# Patient Record
Sex: Female | Born: 1960 | Race: White | Hispanic: No | Marital: Single | Smoking: Current every day smoker
Health system: Southern US, Community
[De-identification: ages and names within clinical notes are randomized; demographics above are authoritative.]

## PROBLEM LIST (undated history)

## (undated) DIAGNOSIS — F329 Major depressive disorder, single episode, unspecified: Secondary | ICD-10-CM

## (undated) DIAGNOSIS — J449 Chronic obstructive pulmonary disease, unspecified: Secondary | ICD-10-CM

## (undated) DIAGNOSIS — I10 Essential (primary) hypertension: Secondary | ICD-10-CM

## (undated) DIAGNOSIS — I639 Cerebral infarction, unspecified: Secondary | ICD-10-CM

## (undated) DIAGNOSIS — M1990 Unspecified osteoarthritis, unspecified site: Secondary | ICD-10-CM

## (undated) DIAGNOSIS — G4730 Sleep apnea, unspecified: Secondary | ICD-10-CM

## (undated) DIAGNOSIS — F32A Depression, unspecified: Secondary | ICD-10-CM

## (undated) DIAGNOSIS — Z720 Tobacco use: Secondary | ICD-10-CM

## (undated) DIAGNOSIS — R519 Headache, unspecified: Secondary | ICD-10-CM

## (undated) DIAGNOSIS — R0600 Dyspnea, unspecified: Secondary | ICD-10-CM

## (undated) DIAGNOSIS — Z87442 Personal history of urinary calculi: Secondary | ICD-10-CM

## (undated) DIAGNOSIS — J45909 Unspecified asthma, uncomplicated: Secondary | ICD-10-CM

## (undated) HISTORY — DX: Depression, unspecified: F32.A

## (undated) HISTORY — DX: Sleep apnea, unspecified: G47.30

## (undated) HISTORY — PX: TONSILLECTOMY: SUR1361

## (undated) HISTORY — PX: DILATION AND CURETTAGE OF UTERUS: SHX78

## (undated) HISTORY — PX: APPENDECTOMY: SHX54

## (undated) HISTORY — PX: CHOLECYSTECTOMY: SHX55

---
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
## 2003-02-21 ENCOUNTER — Encounter: Payer: Self-pay | Admitting: Family Medicine

## 2003-02-21 ENCOUNTER — Ambulatory Visit (HOSPITAL_COMMUNITY): Admission: RE | Admit: 2003-02-21 | Discharge: 2003-02-21 | Payer: Self-pay | Admitting: *Deleted

## 2006-01-03 ENCOUNTER — Ambulatory Visit: Payer: Self-pay | Admitting: Nurse Practitioner

## 2006-06-05 ENCOUNTER — Emergency Department (HOSPITAL_COMMUNITY): Admission: EM | Admit: 2006-06-05 | Discharge: 2006-06-05 | Payer: Self-pay | Admitting: Emergency Medicine

## 2006-08-11 ENCOUNTER — Emergency Department: Payer: Self-pay | Admitting: Unknown Physician Specialty

## 2006-09-19 ENCOUNTER — Emergency Department: Payer: Self-pay | Admitting: Unknown Physician Specialty

## 2008-02-16 ENCOUNTER — Emergency Department (HOSPITAL_COMMUNITY): Admission: EM | Admit: 2008-02-16 | Discharge: 2008-02-16 | Payer: Self-pay | Admitting: Emergency Medicine

## 2008-06-12 ENCOUNTER — Emergency Department: Payer: Self-pay | Admitting: Emergency Medicine

## 2010-11-08 ENCOUNTER — Encounter: Payer: Self-pay | Admitting: Family Medicine

## 2011-06-07 ENCOUNTER — Emergency Department: Payer: Self-pay | Admitting: Internal Medicine

## 2012-06-29 ENCOUNTER — Emergency Department: Payer: Self-pay | Admitting: Emergency Medicine

## 2014-04-08 ENCOUNTER — Emergency Department: Payer: Self-pay | Admitting: Emergency Medicine

## 2014-04-13 ENCOUNTER — Emergency Department: Payer: Self-pay | Admitting: Emergency Medicine

## 2014-06-10 ENCOUNTER — Ambulatory Visit: Payer: Self-pay

## 2015-03-25 ENCOUNTER — Encounter: Payer: Self-pay | Admitting: Emergency Medicine

## 2015-03-25 ENCOUNTER — Emergency Department: Payer: Self-pay

## 2015-03-25 ENCOUNTER — Inpatient Hospital Stay
Admission: EM | Admit: 2015-03-25 | Discharge: 2015-03-26 | DRG: 189 | Disposition: A | Discharge status: Home / Self Care | Payer: Self-pay | Attending: Internal Medicine | Admitting: Internal Medicine

## 2015-03-25 DIAGNOSIS — Z8249 Family history of ischemic heart disease and other diseases of the circulatory system: Secondary | ICD-10-CM

## 2015-03-25 DIAGNOSIS — J441 Chronic obstructive pulmonary disease with (acute) exacerbation: Secondary | ICD-10-CM | POA: Diagnosis present

## 2015-03-25 DIAGNOSIS — Z823 Family history of stroke: Secondary | ICD-10-CM

## 2015-03-25 DIAGNOSIS — J9600 Acute respiratory failure, unspecified whether with hypoxia or hypercapnia: Principal | ICD-10-CM | POA: Diagnosis present

## 2015-03-25 DIAGNOSIS — I10 Essential (primary) hypertension: Secondary | ICD-10-CM | POA: Diagnosis present

## 2015-03-25 DIAGNOSIS — F17210 Nicotine dependence, cigarettes, uncomplicated: Secondary | ICD-10-CM | POA: Diagnosis present

## 2015-03-25 DIAGNOSIS — Z720 Tobacco use: Secondary | ICD-10-CM | POA: Diagnosis present

## 2015-03-25 DIAGNOSIS — Z716 Tobacco abuse counseling: Secondary | ICD-10-CM | POA: Diagnosis present

## 2015-03-25 HISTORY — DX: Essential (primary) hypertension: I10

## 2015-03-25 HISTORY — DX: Tobacco use: Z72.0

## 2015-03-25 HISTORY — DX: Chronic obstructive pulmonary disease, unspecified: J44.9

## 2015-03-25 LAB — CBC WITH DIFFERENTIAL/PLATELET
Basophils Absolute: 0.2 10*3/uL — ABNORMAL HIGH (ref 0–0.1)
Basophils Relative: 2 %
EOS ABS: 0.3 10*3/uL (ref 0–0.7)
Eosinophils Relative: 3 %
HCT: 45.7 % (ref 35.0–47.0)
HEMOGLOBIN: 15 g/dL (ref 12.0–16.0)
LYMPHS ABS: 2.7 10*3/uL (ref 1.0–3.6)
Lymphocytes Relative: 28 %
MCH: 29.4 pg (ref 26.0–34.0)
MCHC: 32.9 g/dL (ref 32.0–36.0)
MCV: 89.3 fL (ref 80.0–100.0)
MONO ABS: 0.7 10*3/uL (ref 0.2–0.9)
MONOS PCT: 7 %
NEUTROS PCT: 60 %
Neutro Abs: 5.8 10*3/uL (ref 1.4–6.5)
Platelets: 202 10*3/uL (ref 150–440)
RBC: 5.11 MIL/uL (ref 3.80–5.20)
RDW: 13.4 % (ref 11.5–14.5)
WBC: 9.7 10*3/uL (ref 3.6–11.0)

## 2015-03-25 LAB — BASIC METABOLIC PANEL
Anion gap: 7 (ref 5–15)
BUN: 22 mg/dL — ABNORMAL HIGH (ref 6–20)
CHLORIDE: 107 mmol/L (ref 101–111)
CO2: 29 mmol/L (ref 22–32)
Calcium: 8.9 mg/dL (ref 8.9–10.3)
Creatinine, Ser: 1.01 mg/dL — ABNORMAL HIGH (ref 0.44–1.00)
GFR calc Af Amer: >60 mL/min (ref >60)
Glucose, Bld: 92 mg/dL (ref 65–99)
Potassium: 4.1 mmol/L (ref 3.5–5.1)
Sodium: 143 mmol/L (ref 135–145)

## 2015-03-25 LAB — TROPONIN I: Troponin I: <0.03 ng/mL (ref <0.031)

## 2015-03-25 LAB — BRAIN NATRIURETIC PEPTIDE: B Natriuretic Peptide: 17 pg/mL (ref 0.0–100.0)

## 2015-03-25 MED ORDER — POLYETHYLENE GLYCOL 3350 17 G PO PACK
17.0000 g | PACK | Freq: Every day | ORAL | Status: DC | PRN
  Filled 2015-03-25: qty 1

## 2015-03-25 MED ORDER — SODIUM CHLORIDE 0.9 % IJ SOLN
3.0000 mL | Freq: Two times a day (BID) | INTRAMUSCULAR | Status: DC
  Administered 2015-03-25 – 2015-03-26 (×2): 3 mL via INTRAVENOUS

## 2015-03-25 MED ORDER — IPRATROPIUM-ALBUTEROL 0.5-2.5 (3) MG/3ML IN SOLN
RESPIRATORY_TRACT | Status: AC
  Administered 2015-03-25: 9 mL via RESPIRATORY_TRACT
  Filled 2015-03-25: qty 27

## 2015-03-25 MED ORDER — ACETAMINOPHEN 650 MG RE SUPP: 650.0000 mg | Freq: Four times a day (QID) | RECTAL | Status: DC | PRN

## 2015-03-25 MED ORDER — METHYLPREDNISOLONE SODIUM SUCC 125 MG IJ SOLR
60.0000 mg | Freq: Two times a day (BID) | INTRAMUSCULAR | Status: DC
  Administered 2015-03-25: 125 mg via INTRAVENOUS
  Administered 2015-03-26: 60 mg via INTRAVENOUS
  Filled 2015-03-25 (×2): qty 2

## 2015-03-25 MED ORDER — ALBUTEROL SULFATE (2.5 MG/3ML) 0.083% IN NEBU
2.5000 mg | INHALATION_SOLUTION | Freq: Once | RESPIRATORY_TRACT | Status: AC
  Administered 2015-03-25: 2.5 mg via RESPIRATORY_TRACT

## 2015-03-25 MED ORDER — METHYLPREDNISOLONE SODIUM SUCC 125 MG IJ SOLR
INTRAMUSCULAR | Status: AC
  Administered 2015-03-25: 125 mg via INTRAVENOUS
  Filled 2015-03-25: qty 2

## 2015-03-25 MED ORDER — SODIUM CHLORIDE 0.9 % IV SOLN
250.0000 mL | INTRAVENOUS | Status: DC | PRN
  Administered 2015-03-25: 250 mL via INTRAVENOUS

## 2015-03-25 MED ORDER — ONDANSETRON HCL 4 MG/2ML IJ SOLN: 4.0000 mg | Freq: Four times a day (QID) | INTRAMUSCULAR | Status: DC | PRN

## 2015-03-25 MED ORDER — ONDANSETRON HCL 4 MG PO TABS
4.0000 mg | ORAL_TABLET | Freq: Four times a day (QID) | ORAL | Status: DC | PRN
Start: 2015-03-25 — End: 2015-03-26

## 2015-03-25 MED ORDER — GUAIFENESIN-DM 100-10 MG/5ML PO SYRP: 5.0000 mL | ORAL_SOLUTION | ORAL | Status: DC | PRN

## 2015-03-25 MED ORDER — ASPIRIN 81 MG PO CHEW
CHEWABLE_TABLET | ORAL | Status: AC
  Administered 2015-03-25: 324 mg via ORAL
  Filled 2015-03-25: qty 4

## 2015-03-25 MED ORDER — LORAZEPAM 2 MG/ML IJ SOLN
0.5000 mg | Freq: Once | INTRAMUSCULAR | Status: AC
  Administered 2015-03-25: 0.5 mg via INTRAVENOUS

## 2015-03-25 MED ORDER — NITROGLYCERIN IN D5W 200-5 MCG/ML-% IV SOLN: 0.0000 ug/min | Freq: Once | INTRAVENOUS | Status: DC

## 2015-03-25 MED ORDER — IPRATROPIUM-ALBUTEROL 0.5-2.5 (3) MG/3ML IN SOLN
9.0000 mL | Freq: Once | RESPIRATORY_TRACT | Status: AC
  Administered 2015-03-25: 9 mL via RESPIRATORY_TRACT

## 2015-03-25 MED ORDER — SODIUM CHLORIDE 0.9 % IJ SOLN: 3.0000 mL | Freq: Two times a day (BID) | INTRAMUSCULAR | Status: DC

## 2015-03-25 MED ORDER — ASPIRIN 81 MG PO CHEW
324.0000 mg | CHEWABLE_TABLET | Freq: Once | ORAL | Status: AC
  Administered 2015-03-25: 324 mg via ORAL

## 2015-03-25 MED ORDER — ALUM & MAG HYDROXIDE-SIMETH 200-200-20 MG/5ML PO SUSP: 30.0000 mL | Freq: Four times a day (QID) | ORAL | Status: DC | PRN

## 2015-03-25 MED ORDER — ALBUTEROL SULFATE (2.5 MG/3ML) 0.083% IN NEBU
INHALATION_SOLUTION | RESPIRATORY_TRACT | Status: AC
  Administered 2015-03-25: 2.5 mg via RESPIRATORY_TRACT
  Filled 2015-03-25: qty 3

## 2015-03-25 MED ORDER — ENOXAPARIN SODIUM 40 MG/0.4ML ~~LOC~~ SOLN
40.0000 mg | SUBCUTANEOUS | Status: DC
Start: 2015-03-25 — End: 2015-03-26
  Filled 2015-03-25 (×2): qty 0.4

## 2015-03-25 MED ORDER — SODIUM CHLORIDE 0.9 % IJ SOLN: 3.0000 mL | INTRAMUSCULAR | Status: DC | PRN

## 2015-03-25 MED ORDER — LEVOFLOXACIN IN D5W 750 MG/150ML IV SOLN
750.0000 mg | INTRAVENOUS | Status: DC
  Administered 2015-03-25: 750 mg via INTRAVENOUS

## 2015-03-25 MED ORDER — NICOTINE 14 MG/24HR TD PT24
14.0000 mg | MEDICATED_PATCH | Freq: Every day | TRANSDERMAL | Status: DC
  Administered 2015-03-25 – 2015-03-26 (×2): 14 mg via TRANSDERMAL
  Filled 2015-03-25 (×2): qty 1

## 2015-03-25 MED ORDER — LORAZEPAM 2 MG/ML IJ SOLN
INTRAMUSCULAR | Status: AC
  Administered 2015-03-25: 0.5 mg via INTRAVENOUS
  Filled 2015-03-25: qty 1

## 2015-03-25 MED ORDER — MAGNESIUM SULFATE 2 GM/50ML IV SOLN
2.0000 g | Freq: Once | INTRAVENOUS | Status: AC
  Administered 2015-03-25: 2 g via INTRAVENOUS

## 2015-03-25 MED ORDER — ALBUTEROL SULFATE (2.5 MG/3ML) 0.083% IN NEBU
2.5000 mg | INHALATION_SOLUTION | RESPIRATORY_TRACT | Status: DC | PRN
  Administered 2015-03-25: 2.5 mg via RESPIRATORY_TRACT

## 2015-03-25 MED ORDER — ACETAMINOPHEN 325 MG PO TABS
650.0000 mg | ORAL_TABLET | Freq: Four times a day (QID) | ORAL | Status: DC | PRN
  Administered 2015-03-25 – 2015-03-26 (×2): 650 mg via ORAL
  Filled 2015-03-25 (×2): qty 2

## 2015-03-25 MED ORDER — DEXTROSE 5 % IV SOLN: 500.0000 mg | Freq: Once | INTRAVENOUS | Status: DC

## 2015-03-25 MED ORDER — IPRATROPIUM-ALBUTEROL 0.5-2.5 (3) MG/3ML IN SOLN
3.0000 mL | RESPIRATORY_TRACT | Status: DC
  Administered 2015-03-25 (×3): 3 mL via RESPIRATORY_TRACT
  Filled 2015-03-25 (×3): qty 3

## 2015-03-25 MED ORDER — DIPHENHYDRAMINE HCL 50 MG/ML IJ SOLN
INTRAMUSCULAR | Status: AC
  Administered 2015-03-25: 50 mg
  Filled 2015-03-25: qty 1

## 2015-03-25 MED ORDER — MOMETASONE FURO-FORMOTEROL FUM 100-5 MCG/ACT IN AERO
2.0000 | INHALATION_SPRAY | Freq: Two times a day (BID) | RESPIRATORY_TRACT | Status: DC
  Administered 2015-03-25 – 2015-03-26 (×2): 2 via RESPIRATORY_TRACT
  Filled 2015-03-25 (×2): qty 8.8

## 2015-03-25 MED ORDER — METHYLPREDNISOLONE SODIUM SUCC 125 MG IJ SOLR
125.0000 mg | Freq: Once | INTRAMUSCULAR | Status: AC
  Administered 2015-03-25: 125 mg via INTRAVENOUS

## 2015-03-25 NOTE — ED Notes (Signed)
Pt resting in bed, bipap intact, pt's breathing appears non labored at this time. Sats 100%, bp stable 140/98. Pt denies any pain at this time.

## 2015-03-25 NOTE — ED Notes (Signed)
Pt assisted to bathroom, bipap removed, pt tolerated well, sats remained mid 90's, pt in no resp distress. Dr Vivia EwingSudani notified, order to transfer her over to [place]@ 2L.

## 2015-03-25 NOTE — ED Notes (Signed)
Difficulty breathing ,x2 months , sudden onset of worsening x7 hours ,difficulty speaking in complete sentences

## 2015-03-25 NOTE — ED Notes (Signed)
Pt resting, bipap intact, pt tolerating well, denies pain.

## 2015-03-25 NOTE — ED Provider Notes (Signed)
[facility] Emergency Department Provider Note  ____________________________________________  Time seen: Upon arrival to the emergency department  I have reviewed the triage vital signs and the nursing notes.   HISTORY  Chief Complaint Respiratory Distress    HPI Traci Cross is a 54 y.o. female with a history of COPD who presents with worsening shortness of breath over the past 7 hours. The patient has been having increased weakness over the past several months with worsening shortness of breath. Worsens with exertion She denies a cough or fever. She denies any chest pain, nausea or vomiting. He has been trying to cut down on her smoking and is down now to 1 pack per day.   Past Medical History  Diagnosis Date  . COPD (chronic obstructive pulmonary disease)     There are no active problems to display for this patient.   Past Surgical History  Procedure Laterality Date  . Tonsillectomy      No current outpatient prescriptions on file.  Allergies Review of patient's allergies indicates no known allergies.  No family history on file.  Social History History  Substance Use Topics  . Smoking status: Current Every Day Smoker  . Smokeless tobacco: Not on file  . Alcohol Use: Not on file    Review of Systems Constitutional: No fever/chills Eyes: No visual changes. ENT: No sore throat. Cardiovascular: Denies chest pain. Respiratory: As above Gastrointestinal: No abdominal pain.  No nausea, no vomiting.  No diarrhea.  No constipation. Genitourinary: Negative for dysuria. Musculoskeletal: Negative for back pain. Skin: Negative for rash. Neurological: Negative for headaches, focal weakness or numbness.  10-point ROS otherwise negative.  ____________________________________________   PHYSICAL EXAM:  VITAL SIGNS: ED Triage Vitals  Enc Vitals Group     BP 03/25/15 0706 180/145 mmHg     Pulse Rate 03/25/15 0706 82     Resp 03/25/15 0707  22     Temp 03/25/15 0706 98.7 F (37.1 C)     Temp Source 03/25/15 0706 Oral     SpO2 03/25/15 0707 96 %     Weight 03/25/15 0707 190 lb (86.183 kg)     Height 03/25/15 0707 5' 7.5" (1.715 m)     Head Cir --      Peak Flow --      Pain Score 03/25/15 0707 0     Pain Loc --      Pain Edu? --      Excl. in GC? --     Constitutional: Alert and oriented.  Eyes: Conjunctivae are normal. PERRL. EOMI. Head: Atraumatic. Nose: No congestion/rhinnorhea. Mouth/Throat: Mucous membranes are moist.  Oropharynx non-erythematous. Neck: No stridor.   Cardiovascular: Normal rate, regular rhythm. Grossly normal heart sounds.  Good peripheral circulation. Respiratory: Increased respiratory effort. No retractions. Decreased breath sounds throughout. No overt wheezing or rales.  Gastrointestinal: Soft and nontender. No distention. No abdominal bruits. No CVA tenderness. Musculoskeletal: No lower extremity tenderness nor edema.  No joint effusions. Neurologic:  Normal speech and language. No gross focal neurologic deficits are appreciated. Speech is normal. No gait instability. Skin:  Skin is warm, dry and intact. No rash noted. Psychiatric: Mood and affect are normal. Speech and behavior are normal.  ____________________________________________   LABS (all labs ordered are listed, but only abnormal results are displayed)  Labs Reviewed  CBC WITH DIFFERENTIAL/PLATELET - Abnormal; Notable for the following:    Basophils Absolute 0.2 (*)    All other components within normal limits  BASIC  METABOLIC PANEL - Abnormal; Notable for the following:    BUN 22 (*)    Creatinine, Ser 1.01 (*)    All other components within normal limits  TROPONIN I  BRAIN NATRIURETIC PEPTIDE   ____________________________________________  EKG  ED ECG REPORT I, Arelia LongestSchaevitz,  Wirt Hemmerich M, the attending physician, personally viewed and interpreted this ECG.   Date: 03/25/2015  EKG Time: 721  Rate: 83  Rhythm: normal  sinus rhythm  Axis: Normal axis  Intervals:none  ST&T Change: No ST elevations or depressions. No abnormal T-wave inversions. Machine read as junctional ST depression but likely due to patient's EKG baseline  ____________________________________________  RADIOLOGY  No acute cardiopulmonary abnormality. ____________________________________________   PROCEDURES  CRITICAL CARE Performed by: Arelia LongestSchaevitz,  Adewale Pucillo M   Total critical care time: 40 minutes  Critical care time was exclusive of separately billable procedures and treating other patients.  Critical care was necessary to treat or prevent imminent or life-threatening deterioration.  Critical care was time spent personally by me on the following activities: development of treatment plan with patient and/or surrogate as well as nursing, discussions with consultants, evaluation of patient's response to treatment, examination of patient, obtaining history from patient or surrogate, ordering and performing treatments and interventions, ordering and review of laboratory studies, ordering and review of radiographic studies, pulse oximetry and re-evaluation of patient's condition.  Patient monitored on BiPAP. ____________________________________________   INITIAL IMPRESSION / ASSESSMENT AND PLAN / ED COURSE  Pertinent labs & imaging results that were available during my care of the patient were reviewed by me and considered in my medical decision making (see chart for details).  BiPAP started because of patient's increased work of breathing.  ----------------------------------------- 8:50 AM on 03/25/2015 -----------------------------------------  After nebs and steroids the patient now has increased air movement and I can now hear wheezes. COPD is consistent with the patient's history. We'll continue on BiPAP. Tolerating BiPAP very well and now speaking in full sentences. To admit patient. Signed out to Dr. Elpidio AnisSudini. Discussed with  patient the importance of stopping smoking as this should help with her lung disease. ____________________________________________   FINAL CLINICAL IMPRESSION(S) / ED DIAGNOSES   Acute COPD exacerbation. Initial visit.   [name] Blazeravid Matthew Merton Wadlow, MD 03/25/15 [phone]

## 2015-03-25 NOTE — ED Notes (Signed)
Pt states that she feels a "stinging" sensation on her right wrist area. Site appears inflammed, redness noted, pt states it itches as well. Site marked for borders. Dr notified, fluid hung, benadryl given per order. Pt tolerated well. No resp distress noted. No rash noted on body.

## 2015-03-25 NOTE — H&P (Signed)
[facility] [place] at [facility]   PATIENT NAME: Traci Cross    MR#:  [id number]  DATE OF BIRTH:  10-Feb-1961  DATE OF ADMISSION:  03/25/2015  PRIMARY CARE PHYSICIAN: No primary care provider on file.   REQUESTING/REFERRING PHYSICIAN: Dr. Raynelle CharySCHAVITZ - ED  CHIEF COMPLAINT:   Chief Complaint  Patient presents with  . Respiratory Distress    HISTORY OF PRESENT ILLNESS:  Traci Cross  is a 54 y.o. female with a known history of COPD, tobacco abuse presents to the emergency room with worsening shortness of breath over the past few weeks. Patient has had on and off wheezing, fatigue but continued to smoke. Her shortness of breath worsened acutely over the last 12 hours and presented to the emergency room. Here she's been noticed to have significant shortness of breath with IV starts and multiple nebulizer therapy only minimal improvement and is being admitted to the hospital. Patient needs BiPAP support and is critically ill.  History has been obtained from old records, patient, ER staff and family at bedside. Chest x-ray reviewed independently.  She had significantly elevated blood pressure on arrival to emergency room but has improved close to normal without any intervention.  PAST MEDICAL HISTORY:   Past Medical History  Diagnosis Date  . COPD (chronic obstructive pulmonary disease)   . Tobacco abuse     PAST SURGICAL HISTORY:   Past Surgical History  Procedure Laterality Date  . Tonsillectomy      SOCIAL HISTORY:   History  Substance Use Topics  . Smoking status: Current Every Day Smoker -- 35 years    Types: Cigarettes  . Smokeless tobacco: Not on file  . Alcohol Use: No    FAMILY HISTORY:   Family History  Problem Relation Age of Onset  . Heart failure Mother   . Heart failure Father   . Stroke Mother   . Stroke Father     DRUG ALLERGIES:  No Known Allergies  REVIEW OF SYSTEMS:   Review of Systems  Constitutional: Positive for  malaise/fatigue. Negative for fever, chills and weight loss.  HENT: Negative for hearing loss and nosebleeds.   Eyes: Negative for blurred vision, double vision and pain.  Respiratory: Positive for cough, sputum production, shortness of breath and wheezing. Negative for hemoptysis.   Cardiovascular: Negative for chest pain, palpitations, orthopnea and leg swelling.  Gastrointestinal: Positive for nausea. Negative for vomiting, abdominal pain, diarrhea and constipation.  Genitourinary: Negative for dysuria and hematuria.  Musculoskeletal: Positive for myalgias. Negative for back pain and falls.  Skin: Negative for rash.  Neurological: Positive for weakness. Negative for dizziness, tremors, sensory change, speech change, focal weakness, seizures and headaches.  Endo/Heme/Allergies: Does not bruise/bleed easily.  Psychiatric/Behavioral: Negative for depression and memory loss. The patient is not nervous/anxious.     MEDICATIONS AT HOME:   Prior to Admission medications   Not on File      VITAL SIGNS:  Blood pressure 140/98, pulse 80, temperature 98.7 F (37.1 C), temperature source Oral, resp. rate 20, height 5\' 7"  (1.702 m), weight 86.183 kg (190 lb), SpO2 100 %.  PHYSICAL EXAMINATION:  Physical Exam  GENERAL:  54 y.o.-year-old patient lying in the bed with acute resp distress. Critically ill appearing EYES: Pupils equal, round, reactive to light and accommodation. No scleral icterus. Extraocular muscles intact.  HEENT: Head atraumatic, normocephalic. Oropharynx and nasopharynx clear. No oropharyngeal erythema, moist oral mucosa  NECK:  Supple, no jugular venous distention. No thyroid enlargement,  no tenderness.  LUNGS: Using accessory muscles, bilateral weezing CARDIOVASCULAR: S1, S2 normal. No murmurs, rubs, or gallops.  ABDOMEN: Soft, nontender, nondistended. Bowel sounds present. No organomegaly or mass.  EXTREMITIES: No pedal edema, cyanosis, or clubbing. + 2 pedal & radial  pulses b/l.   NEUROLOGIC: Cranial nerves II through XII are intact. No focal Motor or sensory deficits appreciated b/l PSYCHIATRIC: The patient is alert and oriented x 3. Good affect.  SKIN: No obvious rash, lesion, or ulcer.   LABORATORY PANEL:   CBC  Recent Labs Lab 03/25/15 0722  WBC 9.7  HGB 15.0  HCT 45.7  PLT 202   ------------------------------------------------------------------------------------------------------------------  Chemistries   Recent Labs Lab 03/25/15 0722  NA 143  K 4.1  CL 107  CO2 29  GLUCOSE 92  BUN 22*  CREATININE 1.01*  CALCIUM 8.9   ------------------------------------------------------------------------------------------------------------------  Cardiac Enzymes  Recent Labs Lab 03/25/15 0722  TROPONINI <0.03   ------------------------------------------------------------------------------------------------------------------  RADIOLOGY:  Dg Chest 1 View  03/25/2015   CLINICAL DATA:  54 year old female with COPD and acute shortness of breath this morning. On BiPAP. Initial encounter.  EXAM: CHEST  1 VIEW  COMPARISON:  Cervical spine radiographs 04/08/2014. Chest radiographs 06/29/2012.  FINDINGS: Portable AP upright view at 0739 hours. 2013 comparison demonstrates chronic increased AP dimension to the chest. Cardiac and mediastinal contours remain within normal limits. No pneumothorax or pulmonary edema. No pleural effusion or consolidation. No acute or confluent pulmonary opacity identified.  IMPRESSION: No acute cardiopulmonary abnormality.   Electronically Signed   By: Odessa Fleming M.D.   On: 03/25/2015 08:02     IMPRESSION AND PLAN:   * Acute COPD exacerbation -IV steroids, Antibiotics - Scheduled Nebulizers - Inhalers -Wean O2 as tolerated - Consult pulmonary if no improvement  Needs pulmonary f/u at discharge along with PFT, Advair, Spiriva.  * Acute resp failure due to above Critically ill. Bipap. Intubate if any  worsening.  * Elevated blood pressure without diagnosis of hypertension Likely from acute distress. Has improved well. We will monitor. Start medications if consistently elevated.  * Tobacco abuse Counseled to quit   All the records are reviewed and case discussed with ED provider. Management plans discussed with the patient, family and they are in agreement.  CODE STATUS: FULL CODE  TOTAL CRITICAL CARE TIME TAKING CARE OF THIS PATIENT: 40 minutes.    Milagros Loll R M.D on 03/25/2015 at 9:20 AM  Between 7am to 6pm - Pager - [phone]  After 6pm go to [email or web address] - password [email or web address] [facility]  [place] [facility]  Office  [phone]  CC: Primary care physician; No primary care provider on file.

## 2015-03-26 ENCOUNTER — Encounter: Payer: Self-pay | Admitting: Internal Medicine

## 2015-03-26 DIAGNOSIS — I10 Essential (primary) hypertension: Secondary | ICD-10-CM | POA: Diagnosis present

## 2015-03-26 MED ORDER — TIOTROPIUM BROMIDE MONOHYDRATE 2.5 MCG/ACT IN AERS: 2.0000 | INHALATION_SPRAY | Freq: Every day | RESPIRATORY_TRACT | Status: DC

## 2015-03-26 MED ORDER — NICOTINE 14 MG/24HR TD PT24
14.0000 mg | MEDICATED_PATCH | Freq: Every day | TRANSDERMAL | Status: DC
Start: 2015-03-26 — End: 2016-11-21

## 2015-03-26 MED ORDER — ALBUTEROL SULFATE HFA 108 (90 BASE) MCG/ACT IN AERS: 2.0000 | INHALATION_SPRAY | Freq: Four times a day (QID) | RESPIRATORY_TRACT | Status: DC | PRN

## 2015-03-26 MED ORDER — HYDROCHLOROTHIAZIDE 25 MG PO TABS: 25.0000 mg | ORAL_TABLET | Freq: Every day | ORAL | Status: DC

## 2015-03-26 MED ORDER — PREDNISONE 20 MG PO TABS: 40.0000 mg | ORAL_TABLET | Freq: Every day | ORAL | Status: DC

## 2015-03-26 MED ORDER — BUDESONIDE-FORMOTEROL FUMARATE 80-4.5 MCG/ACT IN AERO: 2.0000 | INHALATION_SPRAY | Freq: Two times a day (BID) | RESPIRATORY_TRACT | Status: DC

## 2015-03-26 MED ORDER — AZITHROMYCIN 250 MG PO TABS: 500.0000 mg | ORAL_TABLET | Freq: Every day | ORAL | Status: DC

## 2015-03-26 NOTE — Progress Notes (Deleted)
Pt resting quietly in bed at this time, non productive cough noted during this shift cough med given with good effect.Iv fluids infusing without difficulty.

## 2015-03-26 NOTE — Discharge Summary (Signed)
[name]   PATIENT NAME: Traci Cross    MR#:  [id number]  DATE OF BIRTH:  03-04-1961  DATE OF ADMISSION:  03/25/2015 ADMITTING PHYSICIAN: Milagros Loll, MD  DATE OF DISCHARGE: No discharge date for patient encounter.  PRIMARY CARE PHYSICIAN: No primary care provider on file.    ADMISSION DIAGNOSIS:  COPD exacerbation [J44.1]  DISCHARGE DIAGNOSIS:  Principal Problem:   Acute respiratory failure Active Problems:   Tobacco abuse   COPD exacerbation   Accelerated hypertension   SECONDARY DIAGNOSIS:   Past Medical History  Diagnosis Date  . COPD (chronic obstructive pulmonary disease)   . Tobacco abuse   . Hypertension      ADMITTING HISTORY  Traci Cross is a 54 y.o. female with a known history of COPD, tobacco abuse presents to the emergency room with worsening shortness of breath over the past few weeks. Patient has had on and off wheezing, fatigue but continued to smoke. Her shortness of breath worsened acutely over the last 12 hours and presented to the emergency room. Here she's been noticed to have significant shortness of breath with IV starts and multiple nebulizer therapy only minimal improvement and is being admitted to the hospital. Patient needs BiPAP support and is critically ill.  History has been obtained from old records, patient, ER staff and family at bedside. Chest x-ray reviewed independently.  She had significantly elevated blood pressure on arrival to emergency room but has improved close to normal without any intervention.   HOSPITAL COURSE:   * Acute COPD exacerbation -IV steroids, Antibiotics - Scheduled Nebulizers - Inhalers -Wean O2 as tolerated Off Bipap.  * Acute resp failure due to above Resolved  * HTN Started on HCTZ  * Tobacco abuse Counseled to quit  And improved well during his stay in the hospital. By the day of discharge she is on room air. No shortness of breath. Has been  given prescriptions for COPD medications. Referral to PCP. Patient will need outpatient pulmonary function test.  CONSULTS OBTAINED:     DRUG ALLERGIES:   Allergies  Allergen Reactions  . Levaquin [Levofloxacin] Hives    Dr notified.     DISCHARGE MEDICATIONS:   Current Discharge Medication List    START taking these medications   Details  albuterol (PROVENTIL HFA;VENTOLIN HFA) 108 (90 BASE) MCG/ACT inhaler Inhale 2 puffs into the lungs every 6 (six) hours as needed for wheezing or shortness of breath. Qty: 1 Inhaler, Refills: 0    azithromycin (ZITHROMAX) 250 MG tablet Take 2 tablets (500 mg total) by mouth daily. Qty: 4 tablet, Refills: 0    budesonide-formoterol (SYMBICORT) 80-4.5 MCG/ACT inhaler Inhale 2 puffs into the lungs 2 (two) times daily. Qty: 1 Inhaler, Refills: 12    hydrochlorothiazide (HYDRODIURIL) 25 MG tablet Take 1 tablet (25 mg total) by mouth daily. Qty: 30 tablet, Refills: 0    nicotine (NICODERM CQ - DOSED IN MG/24 HOURS) 14 mg/24hr patch Place 1 patch (14 mg total) onto the skin daily. Qty: 28 patch, Refills: 0    predniSONE (DELTASONE) 20 MG tablet Take 2 tablets (40 mg total) by mouth daily with breakfast. Qty: 8 tablet, Refills: 0    Tiotropium Bromide Monohydrate (SPIRIVA RESPIMAT) 2.5 MCG/ACT AERS Inhale 2 puffs into the lungs daily. Qty: 1 Inhaler, Refills: 0       Today    VITAL SIGNS:  Blood pressure 150/91, pulse 88, temperature 98 F (36.7 C), temperature source Oral, resp.  rate 18, height  (1.702 m), weight 88.089 kg (194 lb 3.2 oz), SpO2 97 %.  I/O:   Intake/Output Summary (Last 24 hours) at 03/26/15 1312 Last data filed at 03/26/15 0830  Gross per 24 hour  Intake    480 ml  Output    800 ml  Net   -320 ml    PHYSICAL EXAMINATION:  Physical Exam  GENERAL:  54 y.o.-year-old patient lying in the bed with no acute distress.  LUNGS: Normal breath sounds bilaterally, no wheezing, rales,rhonchi or crepitation. No use of  accessory muscles of respiration.  CARDIOVASCULAR: S1, S2 normal. No murmurs, rubs, or gallops.  ABDOMEN: Soft, non-tender, non-distended. Bowel sounds present. No organomegaly or mass.  NEUROLOGIC: Moves all 4 extremities. PSYCHIATRIC: The patient is alert and oriented x 3.  SKIN: No obvious rash, lesion, or ulcer.   DATA REVIEW:   CBC  Recent Labs Lab 03/25/15 0722  WBC 9.7  HGB 15.0  HCT 45.7  PLT 202    Chemistries   Recent Labs Lab 03/25/15 0722  NA 143  K 4.1  CL 107  CO2 29  GLUCOSE 92  BUN 22*  CREATININE 1.01*  CALCIUM 8.9    Cardiac Enzymes  Recent Labs Lab 03/25/15 0722  TROPONINI <0.03    Microbiology Results  No results found for this or any previous visit.  RADIOLOGY:  Dg Chest 1 View  03/25/2015   CLINICAL DATA:  54 year old female with COPD and acute shortness of breath this morning. On BiPAP. Initial encounter.  EXAM: CHEST  1 VIEW  COMPARISON:  Cervical spine radiographs 04/08/2014. Chest radiographs 06/29/2012.  FINDINGS: Portable AP upright view at 0739 hours. 2013 comparison demonstrates chronic increased AP dimension to the chest. Cardiac and mediastinal contours remain within normal limits. No pneumothorax or pulmonary edema. No pleural effusion or consolidation. No acute or confluent pulmonary opacity identified.  IMPRESSION: No acute cardiopulmonary abnormality.   Electronically Signed   By: Odessa Fleming M.D.   On: 03/25/2015 08:02      Follow up with PCP in 1 week.  Management plans discussed with the patient, family and they are in agreement.  CODE STATUS:     Code Status Orders        Start     Ordered   03/25/15 0853  Full code   Continuous     03/25/15 0853      TOTAL TIME TAKING CARE OF THIS PATIENT ON DAY OF DISCHARGE: more than 30  minutes.    Milagros Loll R M.D on 03/26/2015 at 1:12 PM  Between 7am to 6pm - Pager - [phone]  After 6pm go to [email or web address] - password [email or web address] [facility]  [place] [facility]   Office  [phone]  CC: Primary care physician; No primary care provider on file.

## 2015-03-26 NOTE — Progress Notes (Signed)
Patient is discharge home in a stable condition, denies pain or sob at time of discharge, summary and f/u care given, verbalized understanding , left with a family friend

## 2015-03-26 NOTE — Discharge Instructions (Signed)
°  DIET:  °Cardiac diet ° °DISCHARGE CONDITION:  °Stable ° °ACTIVITY:  °Activity as tolerated ° °OXYGEN:  °Home Oxygen: No. °  °Oxygen Delivery: room air ° °DISCHARGE LOCATION:  °home  ° °If you experience worsening of your admission symptoms, develop shortness of breath, life threatening emergency, suicidal or homicidal thoughts you must seek medical attention immediately by calling 911 or calling your MD immediately  if symptoms less severe. ° °You Must read complete instructions/literature along with all the possible adverse reactions/side effects for all the Medicines you take and that have been prescribed to you. Take any new Medicines after you have completely understood and accpet all the possible adverse reactions/side effects.  ° °Please note ° °You were cared for by a hospitalist during your hospital stay. If you have any questions about your discharge medications or the care you received while you were in the hospital after you are discharged, you can call the unit and asked to speak with the hospitalist on call if the hospitalist that took care of you is not available. Once you are discharged, your primary care physician will handle any further medical issues. Please note that NO REFILLS for any discharge medications will be authorized once you are discharged, as it is imperative that you return to your primary care physician (or establish a relationship with a primary care physician if you do not have one) for your aftercare needs so that they can reassess your need for medications and monitor your lab values. ° °QUIT SMOKING °

## 2015-03-26 NOTE — Progress Notes (Signed)
Pt resting quietly in bed at this time no visible sign of distress noted, c/o headache , tylenol given with satisfactory effect.

## 2015-03-26 NOTE — Care Management (Signed)
Patient without payor or PCP.  Provided her with applications for [facility] and [facility].   Faxed scripts to [facility] and left messages to call CM with confirmation but did not receive return call.  Did receive confirmation of fax.  Provided applications to both agencies

## 2015-03-30 ENCOUNTER — Ambulatory Visit: Payer: Medicaid Other

## 2015-04-04 ENCOUNTER — Other Ambulatory Visit: Payer: Self-pay

## 2015-04-04 ENCOUNTER — Encounter: Payer: Self-pay | Admitting: Emergency Medicine

## 2015-04-04 ENCOUNTER — Emergency Department: Payer: Medicaid Other

## 2015-04-04 ENCOUNTER — Emergency Department
Admission: EM | Admit: 2015-04-04 | Discharge: 2015-04-04 | Disposition: A | Discharge status: Home / Self Care | Payer: Medicaid Other | Attending: Emergency Medicine | Admitting: Emergency Medicine

## 2015-04-04 DIAGNOSIS — Z7951 Long term (current) use of inhaled steroids: Secondary | ICD-10-CM | POA: Insufficient documentation

## 2015-04-04 DIAGNOSIS — Z720 Tobacco use: Secondary | ICD-10-CM | POA: Insufficient documentation

## 2015-04-04 DIAGNOSIS — R079 Chest pain, unspecified: Secondary | ICD-10-CM | POA: Insufficient documentation

## 2015-04-04 DIAGNOSIS — I10 Essential (primary) hypertension: Secondary | ICD-10-CM | POA: Insufficient documentation

## 2015-04-04 DIAGNOSIS — Z79899 Other long term (current) drug therapy: Secondary | ICD-10-CM | POA: Insufficient documentation

## 2015-04-04 DIAGNOSIS — R51 Headache: Secondary | ICD-10-CM | POA: Insufficient documentation

## 2015-04-04 DIAGNOSIS — J441 Chronic obstructive pulmonary disease with (acute) exacerbation: Secondary | ICD-10-CM

## 2015-04-04 LAB — COMPREHENSIVE METABOLIC PANEL
ALBUMIN: 3.8 g/dL (ref 3.5–5.0)
ALT: 16 U/L (ref 14–54)
AST: 21 U/L (ref 15–41)
Alkaline Phosphatase: 82 U/L (ref 38–126)
Anion gap: 5 (ref 5–15)
BUN: 14 mg/dL (ref 6–20)
CALCIUM: 8.5 mg/dL — AB (ref 8.9–10.3)
CO2: 27 mmol/L (ref 22–32)
Chloride: 104 mmol/L (ref 101–111)
Creatinine, Ser: 0.81 mg/dL (ref 0.44–1.00)
GFR calc Af Amer: >60 mL/min (ref >60)
GFR calc non Af Amer: >60 mL/min (ref >60)
Glucose, Bld: 126 mg/dL — ABNORMAL HIGH (ref 65–99)
Potassium: 3.8 mmol/L (ref 3.5–5.1)
Sodium: 136 mmol/L (ref 135–145)
TOTAL PROTEIN: 7.2 g/dL (ref 6.5–8.1)
Total Bilirubin: 0.5 mg/dL (ref 0.3–1.2)

## 2015-04-04 LAB — TROPONIN I

## 2015-04-04 LAB — CBC
HCT: 47.9 % — ABNORMAL HIGH (ref 35.0–47.0)
HEMOGLOBIN: 16.2 g/dL — AB (ref 12.0–16.0)
MCH: 29.9 pg (ref 26.0–34.0)
MCHC: 33.9 g/dL (ref 32.0–36.0)
MCV: 88.3 fL (ref 80.0–100.0)
Platelets: 191 10*3/uL (ref 150–440)
RBC: 5.43 MIL/uL — AB (ref 3.80–5.20)
RDW: 13.7 % (ref 11.5–14.5)
WBC: 8.5 10*3/uL (ref 3.6–11.0)

## 2015-04-04 MED ORDER — DIPHENHYDRAMINE HCL 25 MG PO CAPS
ORAL_CAPSULE | ORAL | Status: AC
  Administered 2015-04-04: 25 mg via ORAL
  Filled 2015-04-04: qty 1

## 2015-04-04 MED ORDER — PREDNISONE 20 MG PO TABS
40.0000 mg | ORAL_TABLET | Freq: Every day | ORAL | Status: DC
Start: 2015-04-04 — End: 2016-03-21

## 2015-04-04 MED ORDER — METHYLPREDNISOLONE SODIUM SUCC 125 MG IJ SOLR
125.0000 mg | Freq: Once | INTRAMUSCULAR | Status: AC
  Administered 2015-04-04: 125 mg via INTRAVENOUS

## 2015-04-04 MED ORDER — IPRATROPIUM-ALBUTEROL 0.5-2.5 (3) MG/3ML IN SOLN
3.0000 mL | RESPIRATORY_TRACT | Status: AC
  Administered 2015-04-04 (×3): 3 mL via RESPIRATORY_TRACT

## 2015-04-04 MED ORDER — KETOROLAC TROMETHAMINE 30 MG/ML IJ SOLN
INTRAMUSCULAR | Status: AC
  Administered 2015-04-04: 30 mg via INTRAVENOUS
  Filled 2015-04-04: qty 1

## 2015-04-04 MED ORDER — KETOROLAC TROMETHAMINE 30 MG/ML IJ SOLN
30.0000 mg | Freq: Once | INTRAMUSCULAR | Status: AC
  Administered 2015-04-04: 30 mg via INTRAVENOUS

## 2015-04-04 MED ORDER — METOCLOPRAMIDE HCL 10 MG PO TABS
ORAL_TABLET | ORAL | Status: AC
Start: 2015-04-04 — End: 2015-04-04
  Administered 2015-04-04: 10 mg via ORAL
  Filled 2015-04-04: qty 1

## 2015-04-04 MED ORDER — METHYLPREDNISOLONE SODIUM SUCC 125 MG IJ SOLR
INTRAMUSCULAR | Status: AC
  Administered 2015-04-04: 125 mg via INTRAVENOUS
  Filled 2015-04-04: qty 2

## 2015-04-04 MED ORDER — DIPHENHYDRAMINE HCL 25 MG PO CAPS: 50.0000 mg | ORAL_CAPSULE | Freq: Four times a day (QID) | ORAL | Status: DC | PRN

## 2015-04-04 MED ORDER — ACETAMINOPHEN 500 MG PO TABS
ORAL_TABLET | ORAL | Status: AC
  Administered 2015-04-04: 1000 mg via ORAL
  Filled 2015-04-04: qty 2

## 2015-04-04 MED ORDER — DEXAMETHASONE 4 MG PO TABS
10.0000 mg | ORAL_TABLET | Freq: Once | ORAL | Status: AC
  Administered 2015-04-04: 10 mg via ORAL
  Filled 2015-04-04: qty 2.5

## 2015-04-04 MED ORDER — METOCLOPRAMIDE HCL 10 MG PO TABS: 10.0000 mg | ORAL_TABLET | Freq: Three times a day (TID) | ORAL | Status: DC

## 2015-04-04 MED ORDER — ACETAMINOPHEN 500 MG PO TABS
1000.0000 mg | ORAL_TABLET | Freq: Once | ORAL | Status: AC
  Administered 2015-04-04: 1000 mg via ORAL

## 2015-04-04 MED ORDER — IPRATROPIUM-ALBUTEROL 0.5-2.5 (3) MG/3ML IN SOLN
RESPIRATORY_TRACT | Status: AC
  Filled 2015-04-04: qty 9

## 2015-04-04 MED ORDER — METOCLOPRAMIDE HCL 10 MG PO TABS
10.0000 mg | ORAL_TABLET | Freq: Once | ORAL | Status: AC
  Administered 2015-04-04: 10 mg via ORAL

## 2015-04-04 MED ORDER — DIPHENHYDRAMINE HCL 25 MG PO CAPS
25.0000 mg | ORAL_CAPSULE | Freq: Once | ORAL | Status: AC
  Administered 2015-04-04: 25 mg via ORAL

## 2015-04-04 NOTE — ED Notes (Signed)
Pt reports that she was discharged from hospital last week for COPD exacerbation, last night began having worsening sob. Also reports chest pain. States that she has ran out of her inhalers. Placed on 2L O2 [place] at this time.

## 2015-04-04 NOTE — ED Notes (Signed)
Pt unable to move indicator on peak flow meter.  MD to be informed

## 2015-04-04 NOTE — ED Provider Notes (Signed)
[facility] Emergency Department Provider Note  ____________________________________________  Time seen: 7:50 PM  I have reviewed the triage vital signs and the nursing notes.   HISTORY  Chief Complaint Shortness of Breath    HPI Traci Cross is a 54 y.o. female was recently hospitalized for COPD exacerbation. She was discharged with medications and steroids. Since then she has resumed smoking although only a few cigarettes a day, and her breathing has worsened.Last night she's had worsening shortness of breath and cough, and she has chest wall pain when she coughs. Cough is nonproductive. No fever or chills. Normal oral intake. Ambulatory.     Past Medical History  Diagnosis Date  . COPD (chronic obstructive pulmonary disease)   . Tobacco abuse   . Hypertension     Patient Active Problem List   Diagnosis Date Noted  . Accelerated hypertension 03/26/2015  . Essential hypertension 03/26/2015  . Tobacco abuse 03/25/2015  . COPD exacerbation 03/25/2015  . Acute respiratory failure 03/25/2015    Past Surgical History  Procedure Laterality Date  . Tonsillectomy      Current Outpatient Rx  Name  Route  Sig  Dispense  Refill  . albuterol (PROVENTIL HFA;VENTOLIN HFA) 108 (90 BASE) MCG/ACT inhaler   Inhalation   Inhale 2 puffs into the lungs every 6 (six) hours as needed for wheezing or shortness of breath.   1 Inhaler   0   . azithromycin (ZITHROMAX) 250 MG tablet   Oral   Take 2 tablets (500 mg total) by mouth daily.   4 tablet   0   . budesonide-formoterol (SYMBICORT) 80-4.5 MCG/ACT inhaler   Inhalation   Inhale 2 puffs into the lungs 2 (two) times daily.   1 Inhaler   12   . diphenhydrAMINE (BENADRYL) 25 mg capsule   Oral   Take 2 capsules (50 mg total) by mouth every 6 (six) hours as needed.   60 capsule   0   . hydrochlorothiazide (HYDRODIURIL) 25 MG tablet   Oral   Take 1 tablet (25 mg total) by mouth daily.   30 tablet    0   . metoCLOPramide (REGLAN) 10 MG tablet   Oral   Take 1 tablet (10 mg total) by mouth 4 (four) times daily -  before meals and at bedtime.   60 tablet   0   . nicotine (NICODERM CQ - DOSED IN MG/24 HOURS) 14 mg/24hr patch   Transdermal   Place 1 patch (14 mg total) onto the skin daily.   28 patch   0   . predniSONE (DELTASONE) 20 MG tablet   Oral   Take 2 tablets (40 mg total) by mouth daily with breakfast.   8 tablet   0   . predniSONE (DELTASONE) 20 MG tablet   Oral   Take 2 tablets (40 mg total) by mouth daily.   14 tablet   0   . Tiotropium Bromide Monohydrate (SPIRIVA RESPIMAT) 2.5 MCG/ACT AERS   Inhalation   Inhale 2 puffs into the lungs daily.   1 Inhaler   0     Allergies Levaquin  Family History  Problem Relation Age of Onset  . Heart failure Mother   . Heart failure Father   . Stroke Mother   . Stroke Father     Social History History  Substance Use Topics  . Smoking status: Current Every Day Smoker -- 0.25 packs/day for 35 years  . Smokeless tobacco: Not on  file  . Alcohol Use: No    Review of Systems  Constitutional: No fever or chills. No weight changes Eyes:No blurry vision or double vision.  ENT: No sore throat. Cardiovascular: Chest wall pain with coughing Respiratory: Shortness of breath with nonproductive cough. Gastrointestinal: Negative for abdominal pain, vomiting and diarrhea.  No BRBPR or melena. Genitourinary: Negative for dysuria, urinary retention, bloody urine, or difficulty urinating. Musculoskeletal: Negative for back pain. No joint swelling or pain. Skin: Negative for rash. Neurological: Bilateral frontal headache. Psychiatric:No anxiety or depression.   Endocrine:No hot/cold intolerance, changes in energy, or sleep difficulty.  10-point ROS otherwise negative.  ____________________________________________   PHYSICAL EXAM:  VITAL SIGNS: ED Triage Vitals  Enc Vitals Group     BP 04/04/15 1829 133/92 mmHg      Pulse Rate 04/04/15 1829 102     Resp 04/04/15 1829 20     Temp 04/04/15 1829 98.2 F (36.8 C)     Temp Source 04/04/15 1829 Oral     SpO2 04/04/15 1829 90 %     Weight 04/04/15 1829 190 lb (86.183 kg)     Height 04/04/15 1829  (1.702 m)     Head Cir --      Peak Flow --      Pain Score 04/04/15 1830 6     Pain Loc --      Pain Edu? --      Excl. in GC? --      Constitutional: Alert and oriented. Mild respiratory distress. Eyes: No scleral icterus. No conjunctival pallor. PERRL. EOMI ENT   Head: Normocephalic and atraumatic.   Nose: No congestion/rhinnorhea. No septal hematoma   Mouth/Throat: MMM, no pharyngeal erythema. No peritonsillar mass. No uvula shift.   Neck: No stridor. No SubQ emphysema. No meningismus. Hematological/Lymphatic/Immunilogical: No cervical lymphadenopathy. Cardiovascular: RRR. Normal and symmetric distal pulses are present in all extremities. No murmurs, rubs, or gallops. Respiratory: Decreased air entry diffusely. Expiratory wheezing diffusely. No focal consolidation.  Gastrointestinal: Soft and nontender. No distention. There is no CVA tenderness.  No rebound, rigidity, or guarding. Genitourinary: deferred Musculoskeletal: Nontender with normal range of motion in all extremities. No joint effusions.  No lower extremity tenderness.  No edema. Neurologic:   Normal speech and language.  CN 2-10 normal. Motor grossly intact. No pronator drift.  Normal gait. No gross focal neurologic deficits are appreciated.  Skin:  Skin is warm, dry and intact. No rash noted.  No petechiae, purpura, or bullae. Psychiatric: Mood and affect are normal. Speech and behavior are normal. Patient exhibits appropriate insight and judgment.  ____________________________________________    LABS (pertinent positives/negatives) (all labs ordered are listed, but only abnormal results are displayed) Labs Reviewed  COMPREHENSIVE METABOLIC PANEL - Abnormal;  Notable for the following:    Glucose, Bld 126 (*)    Calcium 8.5 (*)    All other components within normal limits  CBC - Abnormal; Notable for the following:    RBC 5.43 (*)    Hemoglobin 16.2 (*)    HCT 47.9 (*)    All other components within normal limits  TROPONIN I   ____________________________________________   EKG  Interpreted by me Normal sinus rhythm rate of 99, normal axis and intervals, poor R-wave progression in anterior precordial leads, normal ST segments and normal T waves.  ____________________________________________    RADIOLOGY  Chest x-ray unremarkable  ____________________________________________   PROCEDURES  ____________________________________________   INITIAL IMPRESSION / ASSESSMENT AND PLAN / ED COURSE  Pertinent  labs & imaging results that were available during my care of the patient were reviewed by me and considered in my medical decision making (see chart for details).  Solu-Medrol IV plus DuoNeb 3 for COPD exacerbation. Patient also complaining of headache and was given Toradol with Reglan and Benadryl for this. Patient reported resolution of chest pain and headache. She also felt much better with steroids and DuoNeb's. On repeat lung auscultation 11:00 PM, lungs are clear to auscultation bilaterally with normal expiratory to inspiratory ratio. No wheezing. No distress. Patient ambulatory with pulse ox remaining adequate given her baseline COPD.  I'll give her Decadron for now and then restart her on prednisone. We'll also give her medicines to treat her symptoms and have [facility]  ____________________________________________   FINAL CLINICAL IMPRESSION(S) / ED DIAGNOSES  Final diagnoses:  COPD exacerbation      Sharman Cheek, MD 04/04/15 2312

## 2015-04-04 NOTE — Discharge Instructions (Signed)
Chronic Asthmatic Bronchitis Chronic asthmatic bronchitis is a complication of persistent asthma. After a period of time with asthma, some people develop airflow obstruction that is present all the time, even when not having an asthma attack.There is also persistent inflammation of the airways, and the bronchial tubes produce more mucus. Chronic asthmatic bronchitis usually is a permanent problem with the lungs. CAUSES  Chronic asthmatic bronchitis happens most often in people who have asthma and also smoke cigarettes. Occasionally, it can happen to a person with long-standing or severe asthma even if the person is not a smoker. SIGNS AND SYMPTOMS  Chronic asthmatic bronchitis usually causes symptoms of both asthma and chronic bronchitis, including:   Coughing.  Increased sputum production.  Wheezing and shortness of breath.  Chest discomfort.  Recurring infections. DIAGNOSIS  Your health care provider will take a medical history and perform a physical exam. Chronic asthmatic bronchitis is suspected when a person with asthma has abnormal results on breathing tests (pulmonary function tests) even when breathing symptoms are at their best. Other tests, such as a chest X-ray, may be performed to rule out other conditions.  TREATMENT  Treatment involves controlling symptoms with medicine and lifestyle changes.  Your health care provider may prescribe asthma medicines, including inhaler and nebulizer medicines.  Infection can be treated with medicine to kill germs (antibiotics). Serious infections may require hospitalization. These can include:  Pneumonia.  Sinus infections.  Acute bronchitis.   Preventing infection and hospitalization is very important. Get an influenza vaccination every year as directed by your health care provider. Ask your health care provider whether you need a pneumonia vaccine.  Ask your health care provider whether you would benefit from a pulmonary  rehabilitation program. HOME CARE INSTRUCTIONS  Take medicines only as directed by your health care provider.  If you are a cigarette smoker, the most important thing that you can do is quit. Talk to your health care provider for help with quitting smoking.  Avoid pollen, dust, animal dander, molds, smoke, and other things that cause attacks.  Regular exercise is very important to help you feel better. Discuss possible exercise routines with your health care provider.  If animal dander is the cause of asthma, you may not be able to keep pets.  It is important that you:  Become educated about your medical condition.  Participate in maintaining wellness.  Seek medical care as directed. Delay in seeking medical care could cause permanent injury and may be a risk to your life. SEEK MEDICAL CARE IF:  You have wheezing and shortness of breath even if taking medicine to prevent attacks.  You have muscle aches, chest pain, or thickening of sputum.  Your sputum changes from clear or white to yellow, green, gray, or bloody. SEEK IMMEDIATE MEDICAL CARE IF:  Your usual medicines do not stop your wheezing.  You have increased coughing or shortness of breath or both.  You have increased difficulty breathing.  You have any problems from the medicine you are taking, such as a rash, itching, swelling, or trouble breathing. MAKE SURE YOU:   Understand these instructions.  Will watch your condition.  Will get help right away if you are not doing well or get worse. Document Released: 07/21/2006 Document Revised: 02/17/2014 Document Reviewed: 11/11/2013 Exit[employer] Patient Information 2015 [employer]. This information is not intended to replace advice given to you by your health care provider. Make sure you discuss any questions you have with your health care provider.  

## 2015-04-04 NOTE — ED Notes (Signed)
States does not use 02 at home

## 2015-04-04 NOTE — ED Notes (Signed)
Pt maintained o2sat around 93% while sleeping.  Pt awake, o2sat 89%, o2 applied.

## 2015-04-04 NOTE — ED Notes (Signed)
Pt ambulated to bathroom w/o oxygen.  Pt o2sat upon returning to bed 89%, after rest, went up to 90-92%.  Oxygen [place] reapplied.  MD informed.

## 2015-04-04 NOTE — ED Notes (Signed)
Pt o2sat 97% while sleeping.  Oxygen turned of by this nurse to see if pt can maintain oxygen saturation while sleeping w/o supplementary o2.

## 2015-04-14 ENCOUNTER — Ambulatory Visit: Payer: Medicaid Other

## 2015-04-15 ENCOUNTER — Ambulatory Visit: Payer: Self-pay | Admitting: Internal Medicine

## 2015-04-15 DIAGNOSIS — J45909 Unspecified asthma, uncomplicated: Secondary | ICD-10-CM | POA: Insufficient documentation

## 2015-04-15 DIAGNOSIS — J449 Chronic obstructive pulmonary disease, unspecified: Secondary | ICD-10-CM | POA: Insufficient documentation

## 2015-05-13 ENCOUNTER — Ambulatory Visit: Payer: Self-pay | Admitting: Ophthalmology

## 2015-05-13 ENCOUNTER — Ambulatory Visit: Payer: Self-pay | Admitting: Internal Medicine

## 2015-05-27 ENCOUNTER — Ambulatory Visit: Payer: Self-pay | Admitting: Ophthalmology

## 2015-06-02 ENCOUNTER — Emergency Department: Payer: Self-pay

## 2015-06-02 ENCOUNTER — Encounter: Payer: Self-pay | Admitting: Emergency Medicine

## 2015-06-02 ENCOUNTER — Emergency Department
Admission: EM | Admit: 2015-06-02 | Discharge: 2015-06-02 | Disposition: A | Discharge status: Home / Self Care | Payer: Self-pay | Attending: Emergency Medicine | Admitting: Emergency Medicine

## 2015-06-02 DIAGNOSIS — Z79899 Other long term (current) drug therapy: Secondary | ICD-10-CM | POA: Insufficient documentation

## 2015-06-02 DIAGNOSIS — R221 Localized swelling, mass and lump, neck: Secondary | ICD-10-CM | POA: Insufficient documentation

## 2015-06-02 DIAGNOSIS — J441 Chronic obstructive pulmonary disease with (acute) exacerbation: Secondary | ICD-10-CM | POA: Insufficient documentation

## 2015-06-02 DIAGNOSIS — Z720 Tobacco use: Secondary | ICD-10-CM | POA: Insufficient documentation

## 2015-06-02 DIAGNOSIS — Z7952 Long term (current) use of systemic steroids: Secondary | ICD-10-CM | POA: Insufficient documentation

## 2015-06-02 DIAGNOSIS — I10 Essential (primary) hypertension: Secondary | ICD-10-CM | POA: Insufficient documentation

## 2015-06-02 DIAGNOSIS — J069 Acute upper respiratory infection, unspecified: Secondary | ICD-10-CM | POA: Insufficient documentation

## 2015-06-02 DIAGNOSIS — Z792 Long term (current) use of antibiotics: Secondary | ICD-10-CM | POA: Insufficient documentation

## 2015-06-02 DIAGNOSIS — M542 Cervicalgia: Secondary | ICD-10-CM | POA: Insufficient documentation

## 2015-06-02 DIAGNOSIS — J209 Acute bronchitis, unspecified: Secondary | ICD-10-CM

## 2015-06-02 HISTORY — DX: Unspecified asthma, uncomplicated: J45.909

## 2015-06-02 LAB — CBC WITH DIFFERENTIAL/PLATELET
BASOS PCT: 1 %
Basophils Absolute: 0.1 10*3/uL (ref 0–0.1)
Eosinophils Absolute: 0.4 10*3/uL (ref 0–0.7)
Eosinophils Relative: 6 %
HEMATOCRIT: 46.4 % (ref 35.0–47.0)
Hemoglobin: 15.3 g/dL (ref 12.0–16.0)
LYMPHS ABS: 1.9 10*3/uL (ref 1.0–3.6)
LYMPHS PCT: 26 %
MCH: 29.1 pg (ref 26.0–34.0)
MCHC: 32.9 g/dL (ref 32.0–36.0)
MCV: 88.4 fL (ref 80.0–100.0)
MONO ABS: 0.7 10*3/uL (ref 0.2–0.9)
MONOS PCT: 10 %
NEUTROS ABS: 4.3 10*3/uL (ref 1.4–6.5)
Neutrophils Relative %: 57 %
Platelets: 239 10*3/uL (ref 150–440)
RBC: 5.24 MIL/uL — ABNORMAL HIGH (ref 3.80–5.20)
RDW: 14.1 % (ref 11.5–14.5)
WBC: 7.4 10*3/uL (ref 3.6–11.0)

## 2015-06-02 LAB — URINALYSIS COMPLETE WITH MICROSCOPIC (ARMC ONLY)
BILIRUBIN URINE: NEGATIVE
Bacteria, UA: NONE SEEN
GLUCOSE, UA: NEGATIVE mg/dL
Hgb urine dipstick: NEGATIVE
Leukocytes, UA: NEGATIVE
Nitrite: NEGATIVE
Protein, ur: NEGATIVE mg/dL
RBC / HPF: NONE SEEN RBC/hpf (ref 0–5)
SPECIFIC GRAVITY, URINE: 1.031 — AB (ref 1.005–1.030)
Squamous Epithelial / LPF: NONE SEEN
WBC, UA: NONE SEEN WBC/hpf (ref 0–5)
pH: 5 (ref 5.0–8.0)

## 2015-06-02 LAB — COMPREHENSIVE METABOLIC PANEL
ALK PHOS: 85 U/L (ref 38–126)
ALT: 14 U/L (ref 14–54)
ANION GAP: 8 (ref 5–15)
AST: 22 U/L (ref 15–41)
Albumin: 4 g/dL (ref 3.5–5.0)
BILIRUBIN TOTAL: 0.3 mg/dL (ref 0.3–1.2)
BUN: 12 mg/dL (ref 6–20)
CALCIUM: 8.9 mg/dL (ref 8.9–10.3)
CO2: 25 mmol/L (ref 22–32)
Chloride: 106 mmol/L (ref 101–111)
Creatinine, Ser: 0.78 mg/dL (ref 0.44–1.00)
GFR calc non Af Amer: >60 mL/min (ref >60)
Glucose, Bld: 93 mg/dL (ref 65–99)
Potassium: 3.6 mmol/L (ref 3.5–5.1)
Sodium: 139 mmol/L (ref 135–145)
TOTAL PROTEIN: 7.3 g/dL (ref 6.5–8.1)

## 2015-06-02 MED ORDER — AZITHROMYCIN 250 MG PO TABS: ORAL_TABLET | ORAL | Status: AC

## 2015-06-02 MED ORDER — IOHEXOL 300 MG/ML  SOLN
75.0000 mL | Freq: Once | INTRAMUSCULAR | Status: AC | PRN
  Administered 2015-06-02: 75 mL via INTRAVENOUS

## 2015-06-02 MED ORDER — TRAMADOL HCL 50 MG PO TABS: 50.0000 mg | ORAL_TABLET | Freq: Four times a day (QID) | ORAL | Status: AC | PRN

## 2015-06-02 NOTE — ED Notes (Signed)
Pt to ed with c/o sore throat, eye redness, sob, cough, and mouth pain.  Pt with noted swelling to right side of neck.

## 2015-06-02 NOTE — Discharge Instructions (Signed)
As we discussed your workup today shows largely normal results besides a thyroid goiter. Please follow-up with your primary care doctor soon as possible regarding her quarter to discuss further treatment if deemed necessary. Your cough is most consistent with acute bronchitis. Please take your entire course of anabiotic as prescribed. Return to the emergency department for any worsening symptoms.    Acute Bronchitis Bronchitis is inflammation of the airways that extend from the windpipe into the lungs (bronchi). The inflammation often causes mucus to develop. This leads to a cough, which is the most common symptom of bronchitis.  In acute bronchitis, the condition usually develops suddenly and goes away over time, usually in a couple weeks. Smoking, allergies, and asthma can make bronchitis worse. Repeated episodes of bronchitis may cause further lung problems.  CAUSES Acute bronchitis is most often caused by the same virus that causes a cold. The virus can spread from person to person (contagious) through coughing, sneezing, and touching contaminated objects. SIGNS AND SYMPTOMS   Cough.   Fever.   Coughing up mucus.   Body aches.   Chest congestion.   Chills.   Shortness of breath.   Sore throat.  DIAGNOSIS  Acute bronchitis is usually diagnosed through a physical exam. Your health care provider will also ask you questions about your medical history. Tests, such as chest X-rays, are sometimes done to rule out other conditions.  TREATMENT  Acute bronchitis usually goes away in a couple weeks. Oftentimes, no medical treatment is necessary. Medicines are sometimes given for relief of fever or cough. Antibiotic medicines are usually not needed but may be prescribed in certain situations. In some cases, an inhaler may be recommended to help reduce shortness of breath and control the cough. A cool mist vaporizer may also be used to help thin bronchial secretions and make it easier to  clear the chest.  HOME CARE INSTRUCTIONS  Get plenty of rest.   Drink enough fluids to keep your urine clear or pale yellow (unless you have a medical condition that requires fluid restriction). Increasing fluids may help thin your respiratory secretions (sputum) and reduce chest congestion, and it will prevent dehydration.   Take medicines only as directed by your health care provider.  If you were prescribed an antibiotic medicine, finish it all even if you start to feel better.  Avoid smoking and secondhand smoke. Exposure to cigarette smoke or irritating chemicals will make bronchitis worse. If you are a smoker, consider using nicotine gum or skin patches to help control withdrawal symptoms. Quitting smoking will help your lungs heal faster.   Reduce the chances of another bout of acute bronchitis by washing your hands frequently, avoiding people with cold symptoms, and trying not to touch your hands to your mouth, nose, or eyes.   Keep all follow-up visits as directed by your health care provider.  SEEK MEDICAL CARE IF: Your symptoms do not improve after 1 week of treatment.  SEEK IMMEDIATE MEDICAL CARE IF:  You develop an increased fever or chills.   You have chest pain.   You have severe shortness of breath.  You have bloody sputum.   You develop dehydration.  You faint or repeatedly feel like you are going to pass out.  You develop repeated vomiting.  You develop a severe headache. MAKE SURE YOU:   Understand these instructions.  Will watch your condition.  Will get help right away if you are not doing well or get worse. Document Released: 11/10/2004 Document Revised:  02/17/2014 Document Reviewed: 03/26/2013 ExitCare Patient Information 2015 [place], [place]. This information is not intended to replace advice given to you by your health care provider. Make sure you discuss any questions you have with your health care provider.

## 2015-06-02 NOTE — ED Notes (Signed)
Pt states blood shot eyes, sore throat, swollen throat and cough, productive and "pussy", pt hx of present smoker, pt states 9/10 pain on the right side of her neck, pt speaking in full sentances in no distress

## 2015-06-02 NOTE — ED Provider Notes (Addendum)
[facility] Emergency Department Provider Note  Time seen: 6:16 PM  I have reviewed the triage vital signs and the nursing notes.   HISTORY  Chief Complaint Sore Throat    HPI Traci Cross is a 54 y.o. female with a past medical history of COPD, hypertension, hyperthyroid who presents the emergency department with various complaints of eye redness, shortness of breath, cough, congestion, right neck swelling. According to the patient for the past 2-3 days she has been coughing with sputum production. She also states some sore throat, chills, eye redness which has resolved. She notes increased pain and swelling to the right side of her neck. States she just noticed this this morning. Has a history of an enlarged thyroid but states the swelling appears different. Describes her symptoms as moderate. Neck pain is worse with palpation, or turning/twisting her head.     Past Medical History  Diagnosis Date  . COPD (chronic obstructive pulmonary disease)   . Tobacco abuse   . Hypertension     Patient Active Problem List   Diagnosis Date Noted  . Accelerated hypertension 03/26/2015  . Essential hypertension 03/26/2015  . Tobacco abuse 03/25/2015  . COPD exacerbation 03/25/2015  . Acute respiratory failure 03/25/2015    Past Surgical History  Procedure Laterality Date  . Tonsillectomy      Current Outpatient Rx  Name  Route  Sig  Dispense  Refill  . albuterol (PROVENTIL HFA;VENTOLIN HFA) 108 (90 BASE) MCG/ACT inhaler   Inhalation   Inhale 2 puffs into the lungs every 6 (six) hours as needed for wheezing or shortness of breath.   1 Inhaler   0   . azithromycin (ZITHROMAX) 250 MG tablet   Oral   Take 2 tablets (500 mg total) by mouth daily.   4 tablet   0   . budesonide-formoterol (SYMBICORT) 80-4.5 MCG/ACT inhaler   Inhalation   Inhale 2 puffs into the lungs 2 (two) times daily.   1 Inhaler   12   . diphenhydrAMINE (BENADRYL) 25 mg capsule  Oral   Take 2 capsules (50 mg total) by mouth every 6 (six) hours as needed.   60 capsule   0   . hydrochlorothiazide (HYDRODIURIL) 25 MG tablet   Oral   Take 1 tablet (25 mg total) by mouth daily.   30 tablet   0   . metoCLOPramide (REGLAN) 10 MG tablet   Oral   Take 1 tablet (10 mg total) by mouth 4 (four) times daily -  before meals and at bedtime.   60 tablet   0   . nicotine (NICODERM CQ - DOSED IN MG/24 HOURS) 14 mg/24hr patch   Transdermal   Place 1 patch (14 mg total) onto the skin daily.   28 patch   0   . predniSONE (DELTASONE) 20 MG tablet   Oral   Take 2 tablets (40 mg total) by mouth daily with breakfast.   8 tablet   0   . predniSONE (DELTASONE) 20 MG tablet   Oral   Take 2 tablets (40 mg total) by mouth daily.   14 tablet   0   . Tiotropium Bromide Monohydrate (SPIRIVA RESPIMAT) 2.5 MCG/ACT AERS   Inhalation   Inhale 2 puffs into the lungs daily.   1 Inhaler   0     Allergies Levaquin  Family History  Problem Relation Age of Onset  . Heart failure Mother   . Stroke Mother   .  Heart failure Father   . Stroke Father     Social History Social History  Substance Use Topics  . Smoking status: Current Every Day Smoker -- 0.25 packs/day for 35 years  . Smokeless tobacco: None  . Alcohol Use: No    Review of Systems Constitutional: Negative for fever. Positive for chills. Cardiovascular: Negative for chest pain. Respiratory:Positive for cough, and intermittent shortness of breath. Gastrointestinal: Negative for abdominal pain, vomiting and diarrhea. Genitourinary: Negative for dysuria. Musculoskeletal: Positive for right-sided neck pain and swelling. 10-point ROS otherwise negative.  ____________________________________________   PHYSICAL EXAM:  VITAL SIGNS: ED Triage Vitals  Enc Vitals Group     BP 06/02/15 1650 145/91 mmHg     Pulse Rate 06/02/15 1650 89     Resp 06/02/15 1650 20     Temp 06/02/15 1650 98.2 F (36.8 C)      Temp Source 06/02/15 1650 Oral     SpO2 06/02/15 1650 96 %     Weight 06/02/15 1650 190 lb (86.183 kg)     Height 06/02/15 1650 5\' 7"  (1.702 m)     Head Cir --      Peak Flow --      Pain Score 06/02/15 1650 8     Pain Loc --      Pain Edu? --      Excl. in GC? --     Constitutional: Alert and oriented. Well appearing and in no distress. Eyes: Normal exam ENT   Head: Normocephalic and atraumatic.   Nose: No congestion/rhinnorhea.   Mouth/Throat: Mucous membranes are moist. Mild pharyngeal erythema without exudate or tonsillar swelling. Patient does appear to have an enlarged thyroid on exam. She states the swelling on the right side of her neck has increased and is painful. Patient does have moderate right-sided neck tenderness to palpation. No oral findings to suggest abscess. Cardiovascular: Normal rate, regular rhythm. No murmur Respiratory: Normal respiratory effort without tachypnea nor retractions. Breath sounds are clear and equal bilaterally. No wheezes. Occasional wet sounding cough. Gastrointestinal: Soft and nontender. No distention. Musculoskeletal: Nontender with normal range of motion in all extremities.  Neurologic:  Normal speech and language. No gross focal neurologic deficits  Skin:  Skin is warm, dry and intact.  Psychiatric: Mood and affect are normal. Speech and behavior are normal. Patient exhibits appropriate insight and judgment.  ____________________________________________   RADIOLOGY  CT shows a multinodular goiter, otherwise no acute abnormalities. Chest x-ray shows no acute abnormality.  ____________________________________________    INITIAL IMPRESSION / ASSESSMENT AND PLAN / ED COURSE  Pertinent labs & imaging results that were available during my care of the patient were reviewed by me and considered in my medical decision making (see chart for details).  Patient with various/day complaints. Most concerning to the patient is  right-sided neck swelling and pain. Patient has mild pharyngeal erythema on exam, wet sounding cough. We'll obtain a chest x-ray. Labs are largely within normal limits. We will also send a urinalysis, and obtain a CT scan of the neck with contrast to help further evaluate.  Labs and imaging largely within normal limits. Patient follow-up with her primary care doctor for endocrinology referral for thyroid goiter. Patient is aware of the goiter, and will follow up with her doctor. Given the patient's cough, history of COPD, we'll cover with Zithromax for likely acute bronchitis. Patient will also be given Ultram as needed for discomfort.  ____________________________________________   FINAL CLINICAL IMPRESSION(S) / ED DIAGNOSES  Neck pain  Upper respiratory infection   Minna Antis, MD 06/02/15 1934  Minna Antis, MD 06/02/15 [phone]

## 2015-06-02 NOTE — ED Notes (Signed)
Provider at bedside to discuss results.

## 2015-06-10 ENCOUNTER — Encounter: Payer: Self-pay | Admitting: Internal Medicine

## 2015-06-17 ENCOUNTER — Ambulatory Visit: Payer: Self-pay | Admitting: Ophthalmology

## 2015-06-17 ENCOUNTER — Other Ambulatory Visit: Payer: Self-pay

## 2015-07-08 ENCOUNTER — Ambulatory Visit: Payer: Self-pay | Admitting: Ophthalmology

## 2015-07-08 ENCOUNTER — Other Ambulatory Visit: Payer: Self-pay

## 2015-07-08 LAB — TSH: TSH: 0.82 u[IU]/mL (ref <=5.90)

## 2015-07-15 ENCOUNTER — Ambulatory Visit: Payer: Self-pay | Admitting: Internal Medicine

## 2015-08-12 ENCOUNTER — Ambulatory Visit: Payer: Self-pay | Admitting: Internal Medicine

## 2015-08-12 DIAGNOSIS — E049 Nontoxic goiter, unspecified: Secondary | ICD-10-CM | POA: Insufficient documentation

## 2015-12-09 ENCOUNTER — Other Ambulatory Visit: Payer: Self-pay

## 2016-01-01 ENCOUNTER — Telehealth: Payer: Self-pay | Admitting: Urology

## 2016-01-01 NOTE — Telephone Encounter (Signed)
Made an apt on 01/20/16 at 11:30

## 2016-01-05 DIAGNOSIS — J45909 Unspecified asthma, uncomplicated: Secondary | ICD-10-CM

## 2016-01-05 DIAGNOSIS — J449 Chronic obstructive pulmonary disease, unspecified: Secondary | ICD-10-CM

## 2016-01-05 DIAGNOSIS — E049 Nontoxic goiter, unspecified: Secondary | ICD-10-CM

## 2016-01-13 ENCOUNTER — Encounter: Payer: Self-pay | Admitting: *Deleted

## 2016-01-13 ENCOUNTER — Emergency Department: Payer: Self-pay

## 2016-01-13 ENCOUNTER — Emergency Department
Admission: EM | Admit: 2016-01-13 | Discharge: 2016-01-13 | Disposition: A | Discharge status: Home / Self Care | Payer: Self-pay | Attending: Emergency Medicine | Admitting: Emergency Medicine

## 2016-01-13 DIAGNOSIS — Z7951 Long term (current) use of inhaled steroids: Secondary | ICD-10-CM | POA: Insufficient documentation

## 2016-01-13 DIAGNOSIS — F17200 Nicotine dependence, unspecified, uncomplicated: Secondary | ICD-10-CM | POA: Insufficient documentation

## 2016-01-13 DIAGNOSIS — R0602 Shortness of breath: Secondary | ICD-10-CM

## 2016-01-13 DIAGNOSIS — I10 Essential (primary) hypertension: Secondary | ICD-10-CM | POA: Insufficient documentation

## 2016-01-13 DIAGNOSIS — Z79899 Other long term (current) drug therapy: Secondary | ICD-10-CM | POA: Insufficient documentation

## 2016-01-13 DIAGNOSIS — Z880 Allergy status to penicillin: Secondary | ICD-10-CM | POA: Insufficient documentation

## 2016-01-13 DIAGNOSIS — I159 Secondary hypertension, unspecified: Secondary | ICD-10-CM | POA: Insufficient documentation

## 2016-01-13 DIAGNOSIS — J441 Chronic obstructive pulmonary disease with (acute) exacerbation: Secondary | ICD-10-CM | POA: Insufficient documentation

## 2016-01-13 LAB — BASIC METABOLIC PANEL
Anion gap: 6 (ref 5–15)
BUN: 16 mg/dL (ref 6–20)
CHLORIDE: 108 mmol/L (ref 101–111)
CO2: 26 mmol/L (ref 22–32)
CREATININE: 0.81 mg/dL (ref 0.44–1.00)
Calcium: 8.8 mg/dL — ABNORMAL LOW (ref 8.9–10.3)
GFR calc non Af Amer: >60 mL/min (ref >60)
Glucose, Bld: 135 mg/dL — ABNORMAL HIGH (ref 65–99)
Potassium: 3.7 mmol/L (ref 3.5–5.1)
Sodium: 140 mmol/L (ref 135–145)

## 2016-01-13 LAB — CBC
HCT: 44.2 % (ref 35.0–47.0)
Hemoglobin: 15.2 g/dL (ref 12.0–16.0)
MCH: 29.7 pg (ref 26.0–34.0)
MCHC: 34.2 g/dL (ref 32.0–36.0)
MCV: 86.6 fL (ref 80.0–100.0)
Platelets: 207 10*3/uL (ref 150–440)
RBC: 5.11 MIL/uL (ref 3.80–5.20)
RDW: 13.7 % (ref 11.5–14.5)
WBC: 6.7 10*3/uL (ref 3.6–11.0)

## 2016-01-13 LAB — TROPONIN I: Troponin I: <0.03 ng/mL (ref <0.031)

## 2016-01-13 MED ORDER — IPRATROPIUM-ALBUTEROL 0.5-2.5 (3) MG/3ML IN SOLN
3.0000 mL | Freq: Once | RESPIRATORY_TRACT | Status: AC
  Administered 2016-01-13: 3 mL via RESPIRATORY_TRACT

## 2016-01-13 MED ORDER — HYDROCHLOROTHIAZIDE 25 MG PO TABS: 25.0000 mg | ORAL_TABLET | Freq: Every day | ORAL | Status: DC

## 2016-01-13 MED ORDER — IPRATROPIUM-ALBUTEROL 0.5-2.5 (3) MG/3ML IN SOLN
RESPIRATORY_TRACT | Status: AC
  Administered 2016-01-13: 3 mL via RESPIRATORY_TRACT
  Filled 2016-01-13: qty 9

## 2016-01-13 MED ORDER — PREDNISONE 20 MG PO TABS: 40.0000 mg | ORAL_TABLET | Freq: Every day | ORAL | Status: DC

## 2016-01-13 MED ORDER — PREDNISONE 20 MG PO TABS
60.0000 mg | ORAL_TABLET | Freq: Once | ORAL | Status: AC
  Administered 2016-01-13: 60 mg via ORAL

## 2016-01-13 MED ORDER — PREDNISONE 20 MG PO TABS
ORAL_TABLET | ORAL | Status: AC
  Administered 2016-01-13: 60 mg via ORAL
  Filled 2016-01-13: qty 3

## 2016-01-13 NOTE — Discharge Instructions (Signed)
Please seek medical attention for any high fevers, chest pain, shortness of breath, change in behavior, persistent vomiting, bloody stool or any other new or concerning symptoms.   Hypertension Hypertension is another name for high blood pressure. High blood pressure forces your heart to work harder to pump blood. A blood pressure reading has two numbers, which includes a higher number over a lower number (example: 110/72). HOME CARE   Have your blood pressure rechecked by your doctor.  Only take medicine as told by your doctor. Follow the directions carefully. The medicine does not work as well if you skip doses. Skipping doses also puts you at risk for problems.  Do not smoke.  Monitor your blood pressure at home as told by your doctor. GET HELP IF:  You think you are having a reaction to the medicine you are taking.  You have repeat headaches or feel dizzy.  You have puffiness (swelling) in your ankles.  You have trouble with your vision. GET HELP RIGHT AWAY IF:   You get a very bad headache and are confused.  You feel weak, numb, or faint.  You get chest or belly (abdominal) pain.  You throw up (vomit).  You cannot breathe very well. MAKE SURE YOU:   Understand these instructions.  Will watch your condition.  Will get help right away if you are not doing well or get worse.   This information is not intended to replace advice given to you by your health care provider. Make sure you discuss any questions you have with your health care provider.   Document Released: 03/21/2008 Document Revised: 10/08/2013 Document Reviewed: 07/26/2013 Elsevier Interactive Patient Education 2016 [employer]  Shortness of Breath Shortness of breath means you have trouble breathing. Shortness of breath needs medical care right away. HOME CARE   Do not smoke.  Avoid being around chemicals or things (paint fumes, dust) that may bother your breathing.  Rest as needed. Slowly begin  your normal activities.  Only take medicines as told by your doctor.  Keep all doctor visits as told. GET HELP RIGHT AWAY IF:   Your shortness of breath gets worse.  You feel lightheaded, pass out (faint), or have a cough that is not helped by medicine.  You cough up blood.  You have pain with breathing.  You have pain in your chest, arms, shoulders, or belly (abdomen).  You have a fever.  You cannot walk up stairs or exercise the way you normally do.  You do not get better in the time expected.  You have a hard time doing normal activities even with rest.  You have problems with your medicines.  You have any new symptoms. MAKE SURE YOU:  Understand these instructions.  Will watch your condition.  Will get help right away if you are not doing well or get worse.   This information is not intended to replace advice given to you by your health care provider. Make sure you discuss any questions you have with your health care provider.   Document Released: 03/21/2008 Document Revised: 10/08/2013 Document Reviewed: 12/19/2011 Elsevier Interactive Patient Education [employer] Elsevier Inc.

## 2016-01-13 NOTE — ED Provider Notes (Signed)
[facility] Emergency Department Provider Note    ____________________________________________  Time seen: ~2100  I have reviewed the triage vital signs and the nursing notes.   HISTORY  Chief Complaint Shortness of Breath   History limited by: Not Limited   HPI Traci Cross is a 55 y.o. female with history of COPD who presents to the emergency department today because of concerns for shortness of breath and elevated blood pressure. She states that she has been having increasing shortness of breath for the past 3 weeks. She has been using her inhalers with only minimal relief. She has had a mildly productive cough associated with this. No chest pain. No fevers. Additionally the patient states that her blood pressure has been elevated. She states she was put on blood pressure medications back in June although has not been on this recently.     Past Medical History  Diagnosis Date  . COPD (chronic obstructive pulmonary disease) (HCC)   . Tobacco abuse   . Hypertension   . Asthma     Patient Active Problem List   Diagnosis Date Noted  . Goiter 08/12/2015  . COPD (chronic obstructive pulmonary disease) (HCC) 04/15/2015  . Asthma 04/15/2015  . Accelerated hypertension 03/26/2015  . Essential hypertension 03/26/2015  . Tobacco abuse 03/25/2015  . COPD exacerbation (HCC) 03/25/2015  . Acute respiratory failure (HCC) 03/25/2015    Past Surgical History  Procedure Laterality Date  . Tonsillectomy    . Cholecystectomy      Current Outpatient Rx  Name  Route  Sig  Dispense  Refill  . albuterol (PROVENTIL HFA;VENTOLIN HFA) 108 (90 BASE) MCG/ACT inhaler   Inhalation   Inhale 2 puffs into the lungs every 6 (six) hours as needed for wheezing or shortness of breath.   1 Inhaler   0   . budesonide-formoterol (SYMBICORT) 80-4.5 MCG/ACT inhaler   Inhalation   Inhale 2 puffs into the lungs 2 (two) times daily.   1 Inhaler   12   . diphenhydrAMINE  (BENADRYL) 25 mg capsule   Oral   Take 2 capsules (50 mg total) by mouth every 6 (six) hours as needed.   60 capsule   0   . hydrochlorothiazide (HYDRODIURIL) 25 MG tablet   Oral   Take 1 tablet (25 mg total) by mouth daily.   30 tablet   0   . metoCLOPramide (REGLAN) 10 MG tablet   Oral   Take 1 tablet (10 mg total) by mouth 4 (four) times daily -  before meals and at bedtime.   60 tablet   0   . nicotine (NICODERM CQ - DOSED IN MG/24 HOURS) 14 mg/24hr patch   Transdermal   Place 1 patch (14 mg total) onto the skin daily.   28 patch   0   . predniSONE (DELTASONE) 20 MG tablet   Oral   Take 2 tablets (40 mg total) by mouth daily with breakfast.   8 tablet   0   . predniSONE (DELTASONE) 20 MG tablet   Oral   Take 2 tablets (40 mg total) by mouth daily.   14 tablet   0   . Tiotropium Bromide Monohydrate (SPIRIVA RESPIMAT) 2.5 MCG/ACT AERS   Inhalation   Inhale 2 puffs into the lungs daily.   1 Inhaler   0   . traMADol (ULTRAM) 50 MG tablet   Oral   Take 1 tablet (50 mg total) by mouth every 6 (six) hours as needed.  20 tablet   0     Allergies Levaquin; Codeine; and Penicillins  Family History  Problem Relation Age of Onset  . Heart failure Mother   . Stroke Mother   . Heart failure Father   . Stroke Father     Social History Social History  Substance Use Topics  . Smoking status: Current Every Day Smoker -- 0.25 packs/day for 35 years  . Smokeless tobacco: None  . Alcohol Use: No    Review of Systems  Constitutional: Negative for fever. Cardiovascular: Negative for chest pain. Respiratory: Positive for shortness of breath. Gastrointestinal: Negative for abdominal pain, vomiting and diarrhea. Neurological: Negative for headaches, focal weakness or numbness.   10-point ROS otherwise negative.  ____________________________________________   PHYSICAL EXAM:  VITAL SIGNS: ED Triage Vitals  Enc Vitals Group     BP 01/13/16 1928 154/100  mmHg     Pulse Rate 01/13/16 1928 90     Resp 01/13/16 1928 18     Temp 01/13/16 1928 98.2 F (36.8 C)     Temp Source 01/13/16 1928 Oral     SpO2 01/13/16 1928 96 %     Weight 01/13/16 1928 200 lb (90.719 kg)     Height 01/13/16 1928 5' 7.5" (1.715 m)   Constitutional: Alert and oriented. Well appearing and in no distress. Eyes: Conjunctivae are normal. PERRL. Normal extraocular movements. ENT   Head: Normocephalic and atraumatic.   Nose: No congestion/rhinnorhea.   Mouth/Throat: Mucous membranes are moist.   Neck: No stridor. Hematological/Lymphatic/Immunilogical: No cervical lymphadenopathy. Cardiovascular: Normal rate, regular rhythm.  No murmurs, rubs, or gallops. Respiratory: Mildly increased respiratory effort. Bilateral diffuse wheezing. Gastrointestinal: Soft and nontender. No distention. There is no CVA tenderness. Genitourinary: Deferred Musculoskeletal: Normal range of motion in all extremities. No joint effusions.  No lower extremity tenderness nor edema. Neurologic:  Normal speech and language. No gross focal neurologic deficits are appreciated.  Skin:  Skin is warm, dry and intact. No rash noted. Psychiatric: Mood and affect are normal. Speech and behavior are normal. Patient exhibits appropriate insight and judgment.  ____________________________________________    LABS (pertinent positives/negatives)  Labs Reviewed  BASIC METABOLIC PANEL - Abnormal; Notable for the following:    Glucose, Bld 135 (*)    Calcium 8.8 (*)    All other components within normal limits  CBC  TROPONIN I     ____________________________________________   EKG  I, Phineas Semen, attending physician, personally viewed and interpreted this EKG  EKG Time: 1934 Rate: 88 Rhythm: normal sinus rhythm Axis: normal Intervals: qtc 413 QRS: narrow ST changes: no st elevation Impression: normal ekg ____________________________________________     RADIOLOGY  CXR IMPRESSION: No active cardiopulmonary disease.  ____________________________________________   PROCEDURES  Procedure(s) performed: None  Critical Care performed: No  ____________________________________________   INITIAL IMPRESSION / ASSESSMENT AND PLAN / ED COURSE  Pertinent labs & imaging results that were available during my care of the patient were reviewed by me and considered in my medical decision making (see chart for details).  Patient presented to the emergency department today because of concerns for shortness breath and elevated blood pressure. Patient states she did feel better after DuoNeb treatments. Think likely this is a mild COPD exacerbation. Will plan on discharging home with prednisone patient does have albuterol inhaler. Encouraged use. Additionally will give patient a refill of blood pressure medication started after previous hospitalization. Patient states she has appointment with primary care in 1 week.  ____________________________________________   FINAL  CLINICAL IMPRESSION(S) / ED DIAGNOSES  Final diagnoses:  Shortness of breath  Secondary hypertension, unspecified     Phineas SemenGraydon Jaggar Benko, MD 01/13/16 2152

## 2016-01-13 NOTE — ED Notes (Signed)
Pt to ED via POV with SOB x 3 weeks. Pt states hx of COPD, been using inhaler x 3 weeks with not much relief. Pt also states been off of BP meds since June/july and BP has been elevated recently. Upon arrival, pt AAOx4, vitals wnl at this time, sating 96% on RA. NAD noted at this time.

## 2016-01-13 NOTE — ED Notes (Signed)
AAOx3.  Skin warm and dry.  No SOB/ DOE.  Ambulates with easy and steady gait.  

## 2016-01-20 ENCOUNTER — Encounter: Payer: Self-pay | Admitting: Internal Medicine

## 2016-01-20 ENCOUNTER — Ambulatory Visit: Payer: Self-pay | Admitting: Internal Medicine

## 2016-01-20 VITALS — BP 151/88 | HR 81 | Temp 97.9°F | Wt 208.0 lb

## 2016-01-20 DIAGNOSIS — J4521 Mild intermittent asthma with (acute) exacerbation: Secondary | ICD-10-CM

## 2016-01-20 DIAGNOSIS — J4520 Mild intermittent asthma, uncomplicated: Secondary | ICD-10-CM

## 2016-01-20 MED ORDER — HYDROCHLOROTHIAZIDE 25 MG PO TABS: 25.0000 mg | ORAL_TABLET | Freq: Every day | ORAL | Status: DC

## 2016-01-20 MED ORDER — ALBUTEROL SULFATE HFA 108 (90 BASE) MCG/ACT IN AERS: 2.0000 | INHALATION_SPRAY | Freq: Four times a day (QID) | RESPIRATORY_TRACT | Status: DC | PRN

## 2016-01-20 NOTE — Progress Notes (Signed)
   Subjective:    Patient ID: Traci Cross, female    DOB: 01-25-1961, 55 y.o.   MRN: [id number]  HPI  Pt presents today with hypertension and reports elevated BP when checking at home Pt presents today with asthma  Patient Active Problem List   Diagnosis Date Noted  . Goiter 08/12/2015  . COPD (chronic obstructive pulmonary disease) ([place]) 04/15/2015  . Asthma 04/15/2015  . Accelerated hypertension 03/26/2015  . Essential hypertension 03/26/2015  . Tobacco abuse 03/25/2015  . COPD exacerbation ([place]) 03/25/2015  . Acute respiratory failure ([place]) 03/25/2015    Review of Systems     Objective:   Physical Exam  Constitutional: She is oriented to person, place, and time.  Cardiovascular: Normal rate and regular rhythm.   Pulmonary/Chest: Effort normal and breath sounds normal.  Neurological: She is alert and oriented to person, place, and time.     Medication List       This list is accurate as of: 01/20/16 11:50 AM.  Always use your most recent med list.               albuterol 108 (90 Base) MCG/ACT inhaler  Commonly known as:  PROVENTIL HFA;VENTOLIN HFA  Inhale 2 puffs into the lungs every 6 (six) hours as needed for wheezing or shortness of breath.     budesonide-formoterol 80-4.5 MCG/ACT inhaler  Commonly known as:  SYMBICORT  Inhale 2 puffs into the lungs 2 (two) times daily.     diphenhydrAMINE 25 mg capsule  Commonly known as:  BENADRYL  Take 2 capsules (50 mg total) by mouth every 6 (six) hours as needed.     hydrochlorothiazide 25 MG tablet  Commonly known as:  HYDRODIURIL  Take 1 tablet (25 mg total) by mouth daily.     hydrochlorothiazide 25 MG tablet  Commonly known as:  HYDRODIURIL  Take 1 tablet (25 mg total) by mouth daily.     metoCLOPramide 10 MG tablet  Commonly known as:  REGLAN  Take 1 tablet (10 mg total) by mouth 4 (four) times daily -  before meals and at bedtime.     nicotine 14 mg/24hr patch  Commonly known as:  NICODERM CQ - dosed in  mg/24 hours  Place 1 patch (14 mg total) onto the skin daily.     predniSONE 20 MG tablet  Commonly known as:  DELTASONE  Take 2 tablets (40 mg total) by mouth daily with breakfast.     predniSONE 20 MG tablet  Commonly known as:  DELTASONE  Take 2 tablets (40 mg total) by mouth daily.     predniSONE 20 MG tablet  Commonly known as:  DELTASONE  Take 2 tablets (40 mg total) by mouth daily.     Tiotropium Bromide Monohydrate 2.5 MCG/ACT Aers  Commonly known as:  SPIRIVA RESPIMAT  Inhale 2 puffs into the lungs daily.     traMADol 50 MG tablet  Commonly known as:  ULTRAM  Take 1 tablet (50 mg total) by mouth every 6 (six) hours as needed.             Assessment & Plan:  Advised pt to cut down or stop smoking ot to return to [facility] in six months cbc, met c tsh ,ua lipid

## 2016-01-20 NOTE — Patient Instructions (Signed)
Pt to return to clinic in 6 months an stop or reduce smoking

## 2016-03-03 ENCOUNTER — Telehealth: Payer: Self-pay | Admitting: Urology

## 2016-03-03 NOTE — Telephone Encounter (Signed)
Called very angrily. Says this is her 3rd time calling about the [place] referral to lung clinic.

## 2016-03-21 ENCOUNTER — Emergency Department
Admission: EM | Admit: 2016-03-21 | Discharge: 2016-03-21 | Disposition: A | Discharge status: Home / Self Care | Payer: Self-pay | Attending: Emergency Medicine | Admitting: Emergency Medicine

## 2016-03-21 ENCOUNTER — Emergency Department: Payer: Self-pay

## 2016-03-21 DIAGNOSIS — I10 Essential (primary) hypertension: Secondary | ICD-10-CM | POA: Insufficient documentation

## 2016-03-21 DIAGNOSIS — Z79899 Other long term (current) drug therapy: Secondary | ICD-10-CM | POA: Insufficient documentation

## 2016-03-21 DIAGNOSIS — J441 Chronic obstructive pulmonary disease with (acute) exacerbation: Secondary | ICD-10-CM | POA: Insufficient documentation

## 2016-03-21 DIAGNOSIS — J45909 Unspecified asthma, uncomplicated: Secondary | ICD-10-CM | POA: Insufficient documentation

## 2016-03-21 DIAGNOSIS — F17210 Nicotine dependence, cigarettes, uncomplicated: Secondary | ICD-10-CM | POA: Insufficient documentation

## 2016-03-21 LAB — CBC
HCT: 43 % (ref 35.0–47.0)
Hemoglobin: 14.9 g/dL (ref 12.0–16.0)
MCH: 29.7 pg (ref 26.0–34.0)
MCHC: 34.6 g/dL (ref 32.0–36.0)
MCV: 85.8 fL (ref 80.0–100.0)
PLATELETS: 175 10*3/uL (ref 150–440)
RBC: 5.01 MIL/uL (ref 3.80–5.20)
RDW: 12.8 % (ref 11.5–14.5)
WBC: 6.5 10*3/uL (ref 3.6–11.0)

## 2016-03-21 LAB — COMPREHENSIVE METABOLIC PANEL
ALK PHOS: 94 U/L (ref 38–126)
ALT: 16 U/L (ref 14–54)
AST: 18 U/L (ref 15–41)
Albumin: 4 g/dL (ref 3.5–5.0)
Anion gap: 8 (ref 5–15)
BUN: 24 mg/dL — AB (ref 6–20)
CALCIUM: 9 mg/dL (ref 8.9–10.3)
CHLORIDE: 108 mmol/L (ref 101–111)
CO2: 24 mmol/L (ref 22–32)
Creatinine, Ser: 0.96 mg/dL (ref 0.44–1.00)
GFR calc non Af Amer: >60 mL/min (ref >60)
GLUCOSE: 123 mg/dL — AB (ref 65–99)
Potassium: 3.7 mmol/L (ref 3.5–5.1)
SODIUM: 140 mmol/L (ref 135–145)
Total Bilirubin: 0.2 mg/dL — ABNORMAL LOW (ref 0.3–1.2)
Total Protein: 6.9 g/dL (ref 6.5–8.1)

## 2016-03-21 LAB — TROPONIN I: Troponin I: <0.03 ng/mL (ref <0.031)

## 2016-03-21 MED ORDER — ALBUTEROL SULFATE HFA 108 (90 BASE) MCG/ACT IN AERS: 2.0000 | INHALATION_SPRAY | Freq: Four times a day (QID) | RESPIRATORY_TRACT | Status: DC | PRN

## 2016-03-21 MED ORDER — PREDNISONE 20 MG PO TABS
60.0000 mg | ORAL_TABLET | Freq: Once | ORAL | Status: AC
  Administered 2016-03-21: 60 mg via ORAL
  Filled 2016-03-21: qty 1

## 2016-03-21 MED ORDER — PREDNISONE 20 MG PO TABS
40.0000 mg | ORAL_TABLET | Freq: Every day | ORAL | Status: DC
Start: 2016-03-21 — End: 2016-11-21

## 2016-03-21 NOTE — Discharge Instructions (Signed)

## 2016-03-21 NOTE — ED Notes (Signed)
PT C/O increased SOB for the past couple of days, states she has a hx of COPD.Traci Cross. Pt is in NAD on arrival, pt eating chips and drinking a soda..Traci Cross

## 2016-03-21 NOTE — ED Provider Notes (Signed)
[facility] Emergency Department Provider Note  Time seen: 2:46 PM  I have reviewed the triage vital signs and the nursing notes.   HISTORY  Chief Complaint Shortness of Breath    HPI Traci Cross is a 55 y.o. female with a past medical history of COPD and hypertension presents the emergency department with difficulty breathing. According to the patient for the past 2 days she has been feeling increased shortness of breath and wheeze. She has been using her albuterol inhaler with some relief, but states the inhaler is almost out and she did not feel her wheeze was improving so she came to the emergency department for evaluation. Patient states mild intermittent chest pain yesterday, denies any today. States she has had a mild cough, but no sputum production. Denies fever. Denies any current chest pain. Denies any leg pain. Denies any swelling.     Past Medical History  Diagnosis Date  . COPD (chronic obstructive pulmonary disease) (HCC)   . Tobacco abuse   . Hypertension   . Asthma     Patient Active Problem List   Diagnosis Date Noted  . Goiter 08/12/2015  . COPD (chronic obstructive pulmonary disease) (HCC) 04/15/2015  . Asthma 04/15/2015  . Accelerated hypertension 03/26/2015  . Essential hypertension 03/26/2015  . Tobacco abuse 03/25/2015  . COPD exacerbation (HCC) 03/25/2015  . Acute respiratory failure (HCC) 03/25/2015    Past Surgical History  Procedure Laterality Date  . Tonsillectomy    . Cholecystectomy      Current Outpatient Rx  Name  Route  Sig  Dispense  Refill  . albuterol (PROVENTIL HFA;VENTOLIN HFA) 108 (90 Base) MCG/ACT inhaler   Inhalation   Inhale 2 puffs into the lungs every 6 (six) hours as needed for wheezing or shortness of breath.   1 Inhaler   0   . budesonide-formoterol (SYMBICORT) 80-4.5 MCG/ACT inhaler   Inhalation   Inhale 2 puffs into the lungs 2 (two) times daily.   1 Inhaler   12   . diphenhydrAMINE  (BENADRYL) 25 mg capsule   Oral   Take 2 capsules (50 mg total) by mouth every 6 (six) hours as needed.   60 capsule   0   . hydrochlorothiazide (HYDRODIURIL) 25 MG tablet   Oral   Take 1 tablet (25 mg total) by mouth daily. Patient not taking: Reported on 01/20/2016   30 tablet   1   . hydrochlorothiazide (HYDRODIURIL) 25 MG tablet   Oral   Take 1 tablet (25 mg total) by mouth daily.   30 tablet   0   . metoCLOPramide (REGLAN) 10 MG tablet   Oral   Take 1 tablet (10 mg total) by mouth 4 (four) times daily -  before meals and at bedtime. Patient not taking: Reported on 01/20/2016   60 tablet   0   . nicotine (NICODERM CQ - DOSED IN MG/24 HOURS) 14 mg/24hr patch   Transdermal   Place 1 patch (14 mg total) onto the skin daily. Patient not taking: Reported on 01/20/2016   28 patch   0   . predniSONE (DELTASONE) 20 MG tablet   Oral   Take 2 tablets (40 mg total) by mouth daily with breakfast. Patient not taking: Reported on 01/20/2016   8 tablet   0   . predniSONE (DELTASONE) 20 MG tablet   Oral   Take 2 tablets (40 mg total) by mouth daily. Patient not taking: Reported on 01/20/2016  14 tablet   0   . predniSONE (DELTASONE) 20 MG tablet   Oral   Take 2 tablets (40 mg total) by mouth daily. Patient not taking: Reported on 01/20/2016   8 tablet   0   . Tiotropium Bromide Monohydrate (SPIRIVA RESPIMAT) 2.5 MCG/ACT AERS   Inhalation   Inhale 2 puffs into the lungs daily. Patient not taking: Reported on 01/20/2016   1 Inhaler   0   . traMADol (ULTRAM) 50 MG tablet   Oral   Take 1 tablet (50 mg total) by mouth every 6 (six) hours as needed. Patient not taking: Reported on 01/20/2016   20 tablet   0     Allergies Levaquin; Codeine; and Penicillins  Family History  Problem Relation Age of Onset  . Heart failure Mother   . Stroke Mother   . Heart failure Father   . Stroke Father     Social History Social History  Substance Use Topics  . Smoking status: Current  Every Day Smoker -- 0.25 packs/day for 35 years    Types: Cigarettes  . Smokeless tobacco: None  . Alcohol Use: No    Review of Systems Constitutional: Negative for fever. Cardiovascular: Mild chest pain last night, none since. Respiratory: Positive for shortness of breath. Gastrointestinal: Negative for abdominal pain Musculoskeletal: Negative for back pain. Neurological: Negative for headache 10-point ROS otherwise negative.  ____________________________________________   PHYSICAL EXAM:  VITAL SIGNS: ED Triage Vitals  Enc Vitals Group     BP 03/21/16 1254 155/90 mmHg     Pulse Rate 03/21/16 1254 80     Resp 03/21/16 1254 20     Temp 03/21/16 1254 97.7 F (36.5 C)     Temp Source 03/21/16 1254 Oral     SpO2 03/21/16 1254 96 %     Weight 03/21/16 1254 200 lb (90.719 kg)     Height 03/21/16 1254 5\' 7"  (1.702 m)     Head Cir --      Peak Flow --      Pain Score --      Pain Loc --      Pain Edu? --      Excl. in GC? --     Constitutional: Alert and oriented. Well appearing and in no distress. Eyes: Normal exam ENT   Head: Normocephalic and atraumatic.   Mouth/Throat: Mucous membranes are moist. Cardiovascular: Normal rate, regular rhythm. No murmur Respiratory: Normal respiratory effort without tachypnea nor retractions. Slight expiratory wheeze bilaterally. No rales or rhonchi. Gastrointestinal: Soft and nontender. No distention.  Musculoskeletal: Nontender with normal range of motion in all extremities. No lower extremity tenderness or edema. Neurologic:  Normal speech and language. No gross focal neurologic deficits Skin:  Skin is warm, dry and intact.  Psychiatric: Mood and affect are normal.  ____________________________________________    EKG  EKG reviewed and interpreted by myself shows normal sinus rhythm at 72 bpm, narrow QRS, normal axis, normal intervals, nonspecific but no concerning ST changes. Overall reassuring  EKG.  ____________________________________________    RADIOLOGY  No acute findings on chest x-ray.   INITIAL IMPRESSION / ASSESSMENT AND PLAN / ED COURSE  Pertinent labs & imaging results that were available during my care of the patient were reviewed by me and considered in my medical decision making (see chart for details).  The patient presents the emergency department 2 days of worsening shortness of breath, expiratory wheeze. Patient does state an occasional mild cough. Upon examination patient talking  on cellphone no acute distress. Minimal wheeze bilaterally. Patient is a daily smoker. Overall the patient has a normal exam besides mild expiratory wheeze. 96% room air O2 saturation. We will refill the patient's Proventil inhaler, placed the patient on a course of steroids for likely COPD exacerbation. However as the patient did have intermittent chest pain yesterday we will check basic labs including a troponin, however I believe her discomfort was more likely related to her COPD exacerbation.  Labs within normal limits. Troponin negative. We will discharge patient home with a course of steroids and Proventil.  ____________________________________________   FINAL CLINICAL IMPRESSION(S) / ED DIAGNOSES  COPD exacerbation   Minna Antis, MD 03/21/16 1600

## 2016-07-04 DIAGNOSIS — J449 Chronic obstructive pulmonary disease, unspecified: Secondary | ICD-10-CM | POA: Insufficient documentation

## 2016-07-20 ENCOUNTER — Ambulatory Visit: Payer: Self-pay | Admitting: Internal Medicine

## 2016-10-19 ENCOUNTER — Telehealth: Payer: Self-pay | Admitting: Pharmacist

## 2016-10-19 NOTE — Telephone Encounter (Signed)
Ventolin HFA refill PAP submitted to manufacturer today. °

## 2016-10-20 ENCOUNTER — Telehealth: Payer: Self-pay | Admitting: Pharmacist

## 2016-10-20 NOTE — Telephone Encounter (Signed)
Symbicort refill PAP submitted to manufacturer today.

## 2016-11-21 ENCOUNTER — Other Ambulatory Visit: Payer: Self-pay | Admitting: Internal Medicine

## 2016-11-21 ENCOUNTER — Encounter (INDEPENDENT_AMBULATORY_CARE_PROVIDER_SITE_OTHER): Payer: Self-pay

## 2016-11-21 ENCOUNTER — Encounter: Payer: Self-pay | Admitting: Pharmacist

## 2016-11-21 ENCOUNTER — Ambulatory Visit: Payer: Self-pay | Admitting: Pharmacist

## 2016-11-21 VITALS — BP 125/83 | Wt 203.0 lb

## 2016-11-21 DIAGNOSIS — Z79899 Other long term (current) drug therapy: Secondary | ICD-10-CM

## 2016-11-21 NOTE — Progress Notes (Addendum)
  Medication Management Clinic Visit Note  Patient: Marcene Corningllen M Lehane MRN: [id number] Date of Birth: 04-20-1961 PCP: Pcp Not In System   Marcene Corningllen M Delisi 56 y.o. female presents for an Initial MTM visit today.  BP 125/83   Wt 203 lb (92.1 kg)   BMI 31.79 kg/m   Patient Information   Past Medical History:  Diagnosis Date  . Asthma   . COPD (chronic obstructive pulmonary disease) (HCC)   . Hypertension   . Tobacco abuse       Past Surgical History:  Procedure Laterality Date  . CHOLECYSTECTOMY    . TONSILLECTOMY       Family History  Problem Relation Age of Onset  . Heart failure Mother   . Stroke Mother   . Heart failure Father   . Stroke Father     New Diagnoses (since last visit): n/a  Family Support: sister helps with transportation, lives at home with 3 yo grandson.  Lifestyle Diet: Breakfast: eggs, bacon, fruit, greek yogurt Lunch: sandwich or salad Dinner: boxed dinner, fruit,  Drinks: soda, some water            History  Alcohol Use No      History  Smoking Status  . Current Every Day Smoker  . Packs/day: 0.25  . Years: 35.00  . Types: Cigarettes  Smokeless Tobacco  . Never Used      Health Maintenance  Topic Date Due  . Hepatitis C Screening  007-02-1961  . HIV Screening  04/12/1977  . TETANUS/TDAP  04/12/1981  . PAP SMEAR  04/13/1983  . MAMMOGRAM  04/12/2012  . COLONOSCOPY  04/12/2012  . INFLUENZA VACCINE  05/17/2016   Prior to Admission medications   Medication Sig Start Date End Date Taking? Authorizing Provider  albuterol (PROVENTIL HFA;VENTOLIN HFA) 108 (90 Base) MCG/ACT inhaler Inhale 2 puffs into the lungs every 6 (six) hours as needed for wheezing or shortness of breath. 03/21/16  Yes Minna AntisKevin Paduchowski, MD  budesonide-formoterol [facility](SYMBICORT) 160-4.5 MCG/ACT inhaler Inhale 2 puffs into the lungs 2 (two) times daily. 07/04/16  Yes Historical Provider, MD  hydrochlorothiazide (HYDRODIURIL) 25 MG tablet Take 1 tablet (25 mg total) by  mouth daily. 01/20/16  Yes Virl Axeon C Chaplin, MD  tiotropium (SPIRIVA) 18 MCG inhalation capsule Place 1 capsule into inhaler and inhale daily. 07/04/16 07/04/17 Yes Historical Provider, MD  varenicline (CHANTIX) 1 MG tablet Take 1 mg by mouth 2 (two) times daily.   Yes Historical Provider, MD      Assessment and Plan:   COPD: uses rescue inhaler over 4x/day. Pt is on ventolin, symbicort, and spiriva.  Pt also has a nebulizer and is good about taking her inhaler with her.   Smoking cessation: she reports she is doing well on the chantix, no major SEs.    Denice Paradisehristan Hadlyn Amero, PharmD, RPh [facility] [phone]

## 2016-12-08 ENCOUNTER — Ambulatory Visit: Payer: Self-pay | Admitting: Adult Health Nurse Practitioner

## 2016-12-08 VITALS — BP 171/99 | HR 91 | Temp 98.1°F | Wt 206.2 lb

## 2016-12-08 DIAGNOSIS — J449 Chronic obstructive pulmonary disease, unspecified: Secondary | ICD-10-CM

## 2016-12-08 DIAGNOSIS — G5603 Carpal tunnel syndrome, bilateral upper limbs: Secondary | ICD-10-CM

## 2016-12-08 DIAGNOSIS — M19011 Primary osteoarthritis, right shoulder: Secondary | ICD-10-CM | POA: Insufficient documentation

## 2016-12-08 DIAGNOSIS — I10 Essential (primary) hypertension: Secondary | ICD-10-CM

## 2016-12-08 MED ORDER — ALBUTEROL SULFATE HFA 108 (90 BASE) MCG/ACT IN AERS: 2.0000 | INHALATION_SPRAY | Freq: Four times a day (QID) | RESPIRATORY_TRACT | 1 refills | Status: DC | PRN

## 2016-12-08 MED ORDER — HYDROCHLOROTHIAZIDE 25 MG PO TABS: 25.0000 mg | ORAL_TABLET | Freq: Every day | ORAL | 0 refills | Status: DC

## 2016-12-08 MED ORDER — MELOXICAM 7.5 MG PO TABS: 7.5000 mg | ORAL_TABLET | Freq: Every day | ORAL | 0 refills | Status: DC

## 2016-12-08 MED ORDER — BUDESONIDE-FORMOTEROL FUMARATE 160-4.5 MCG/ACT IN AERO: 2.0000 | INHALATION_SPRAY | Freq: Two times a day (BID) | RESPIRATORY_TRACT | 2 refills | Status: DC

## 2016-12-08 MED ORDER — LISINOPRIL 10 MG PO TABS: 20.0000 mg | ORAL_TABLET | Freq: Every day | ORAL | 4 refills | Status: DC

## 2016-12-08 MED ORDER — TIOTROPIUM BROMIDE MONOHYDRATE 18 MCG IN CAPS: 1.0000 | ORAL_CAPSULE | Freq: Every day | RESPIRATORY_TRACT | 2 refills | Status: DC

## 2016-12-08 NOTE — Progress Notes (Signed)
  Patient: Traci Cross Female    DOB: 01-11-1961   56 y.o.   MRN: [id number] Visit Date: 12/08/2016  Today's Provider: [facility]   Chief Complaint  Patient presents with  . Carpal Tunnel  . Follow-up  . Shortness of Breath    50% oxygen   Subjective:    HPI   Would like to see someone for carpal tunnel.  Pt states that she has had this for about 20 years and it is getting worse.  Taking OTC ibuprofen with minimal relief.   COPD:  Taking medications as directed.  Using spiriva and symbicort.   Using ventolin on average twice a day.  Continues to have some SOB and smoking daily.   HTN:  Taking HCTZ as directed.  Currently out of medications.       Allergies  Allergen Reactions  . Levaquin [Levofloxacin] Hives    Dr notified.   . Codeine   . Penicillins    Previous Medications   ALBUTEROL (PROVENTIL HFA;VENTOLIN HFA) 108 (90 BASE) MCG/ACT INHALER    Inhale 2 puffs into the lungs every 6 (six) hours as needed for wheezing or shortness of breath.   BUDESONIDE-FORMOTEROL (SYMBICORT) 160-4.5 MCG/ACT INHALER    Inhale 2 puffs into the lungs 2 (two) times daily.   HYDROCHLOROTHIAZIDE (HYDRODIURIL) 25 MG TABLET    Take 1 tablet (25 mg total) by mouth daily.   TIOTROPIUM (SPIRIVA) 18 MCG INHALATION CAPSULE    Place 1 capsule into inhaler and inhale daily.   VARENICLINE (CHANTIX) 1 MG TABLET    Take 1 mg by mouth 2 (two) times daily.    Review of Systems  All other systems reviewed and are negative.   Social History  Substance Use Topics  . Smoking status: Current Every Day Smoker    Packs/day: 0.25    Years: 35.00    Types: Cigarettes  . Smokeless tobacco: Never Used  . Alcohol use No   Objective:   BP (!) 171/99   Pulse 91   Temp 98.1 F (36.7 C)   Wt 206 lb 3.2 oz (93.5 kg)   BMI 32.30 kg/m   Physical Exam  Constitutional: She is oriented to person, place, and time. She appears well-developed and well-nourished.  HENT:  Head: Normocephalic  and atraumatic.  Cardiovascular: Normal rate and regular rhythm.   Pulmonary/Chest: Effort normal and breath sounds normal.  Diminished throughout  Neurological: She is alert and oriented to person, place, and time.        Assessment & Plan:          Refer to ortho clinic for carpal tunnel.  Elevate hands on pillows at night.  Wear braces if she can find them.  Meloxicam at bedtime. Avoid other NSAID use.   HTN:  Not controlled.  Goal BP <140/80.  Continue HCTZ, add lisinopril 10mg  daily.  Encourage low salt diet and exercise.  FU in 4 weeks for BP check and BMP.   COPD:  Continue current medication regimen.  FU with pulmonology as indicated.  Encouraged smoking cessation.     [facility]   [facility]

## 2016-12-14 ENCOUNTER — Encounter: Payer: Self-pay | Admitting: Specialist

## 2016-12-19 ENCOUNTER — Telehealth: Payer: Self-pay | Admitting: Pharmacist

## 2016-12-19 NOTE — Telephone Encounter (Signed)
[place] Symbicort 160/4.5, allow 7-10 days to receive.

## 2016-12-19 NOTE — Telephone Encounter (Signed)
12/16/16 Called [facility] for refill on Chantix, to release January 13, 2017.

## 2016-12-21 ENCOUNTER — Ambulatory Visit: Payer: Self-pay | Admitting: Specialist

## 2016-12-21 DIAGNOSIS — M25531 Pain in right wrist: Secondary | ICD-10-CM

## 2016-12-21 MED ORDER — NAPROXEN 500 MG PO TABS: 500.0000 mg | ORAL_TABLET | Freq: Two times a day (BID) | ORAL | 0 refills | Status: DC

## 2016-12-21 NOTE — Progress Notes (Signed)
   Subjective:    Patient ID: Marcene Corningllen M Gregg, female    DOB: August 26, 1961, 56 y.o.   MRN: [id number]  HPI   56 yr old with Hx of bilateral CTS. DX 20 years ago w/ NCV. Has been braced. No surgery. Returns because of increased pain on right. Has nocturnal pain. Like "on fire". Would consider Sx if indicated. No hx of DM, does have COPD. No current N/T. Has a brace and which is too small.   Review of Systems     Objective:   Physical Exam   No trophic changes of fingers. Full ROM fingers and wrist. Finklenstein's negative left, + R causing global wrist pain. Tinel's test +/- R, -L. Phalen's and reverse Phalen's -. Durkin -. 2 point discrimation is 4 mm all digits.     Assessment & Plan:  IMP: Possible CTS.   Recommendation: Return in one month. Will do a xray of right thumb and right wrist prior to return.

## 2016-12-28 ENCOUNTER — Emergency Department
Admission: EM | Admit: 2016-12-28 | Discharge: 2016-12-28 | Disposition: A | Discharge status: Home / Self Care | Payer: Medicaid Other | Attending: Emergency Medicine | Admitting: Emergency Medicine

## 2016-12-28 ENCOUNTER — Encounter: Payer: Self-pay | Admitting: Emergency Medicine

## 2016-12-28 ENCOUNTER — Emergency Department: Payer: Medicaid Other

## 2016-12-28 DIAGNOSIS — F17210 Nicotine dependence, cigarettes, uncomplicated: Secondary | ICD-10-CM | POA: Insufficient documentation

## 2016-12-28 DIAGNOSIS — J45909 Unspecified asthma, uncomplicated: Secondary | ICD-10-CM | POA: Diagnosis not present

## 2016-12-28 DIAGNOSIS — Z79899 Other long term (current) drug therapy: Secondary | ICD-10-CM | POA: Insufficient documentation

## 2016-12-28 DIAGNOSIS — J441 Chronic obstructive pulmonary disease with (acute) exacerbation: Secondary | ICD-10-CM | POA: Diagnosis not present

## 2016-12-28 DIAGNOSIS — I10 Essential (primary) hypertension: Secondary | ICD-10-CM | POA: Diagnosis not present

## 2016-12-28 DIAGNOSIS — R0602 Shortness of breath: Secondary | ICD-10-CM | POA: Diagnosis present

## 2016-12-28 LAB — CBC
HCT: 43.1 % (ref 35.0–47.0)
Hemoglobin: 15 g/dL (ref 12.0–16.0)
MCH: 30.3 pg (ref 26.0–34.0)
MCHC: 34.8 g/dL (ref 32.0–36.0)
MCV: 87.2 fL (ref 80.0–100.0)
PLATELETS: 215 10*3/uL (ref 150–440)
RBC: 4.94 MIL/uL (ref 3.80–5.20)
RDW: 13.4 % (ref 11.5–14.5)
WBC: 7.2 10*3/uL (ref 3.6–11.0)

## 2016-12-28 LAB — TROPONIN I: Troponin I: <0.03 ng/mL (ref <0.03)

## 2016-12-28 LAB — BASIC METABOLIC PANEL
Anion gap: 7 (ref 5–15)
BUN: 17 mg/dL (ref 6–20)
CALCIUM: 8.8 mg/dL — AB (ref 8.9–10.3)
CO2: 25 mmol/L (ref 22–32)
CREATININE: 0.62 mg/dL (ref 0.44–1.00)
Chloride: 105 mmol/L (ref 101–111)
GFR calc Af Amer: >60 mL/min (ref >60)
Glucose, Bld: 96 mg/dL (ref 65–99)
POTASSIUM: 4 mmol/L (ref 3.5–5.1)
SODIUM: 137 mmol/L (ref 135–145)

## 2016-12-28 MED ORDER — IPRATROPIUM-ALBUTEROL 0.5-2.5 (3) MG/3ML IN SOLN
3.0000 mL | Freq: Once | RESPIRATORY_TRACT | Status: AC
  Administered 2016-12-28: 3 mL via RESPIRATORY_TRACT
  Filled 2016-12-28: qty 3

## 2016-12-28 MED ORDER — NYSTATIN 100000 UNIT/GM EX POWD
Freq: Once | CUTANEOUS | Status: AC
  Administered 2016-12-28: 23:00:00 via TOPICAL
  Filled 2016-12-28: qty 15

## 2016-12-28 MED ORDER — PREDNISONE 20 MG PO TABS: 60.0000 mg | ORAL_TABLET | Freq: Every day | ORAL | 0 refills | Status: AC

## 2016-12-28 MED ORDER — PREDNISONE 20 MG PO TABS
60.0000 mg | ORAL_TABLET | Freq: Once | ORAL | Status: AC
  Administered 2016-12-28: 60 mg via ORAL
  Filled 2016-12-28: qty 3

## 2016-12-28 NOTE — ED Notes (Signed)
ED Provider at bedside. 

## 2016-12-28 NOTE — ED Triage Notes (Signed)
Pt with chest pain and shortness of breath when lying downl

## 2016-12-28 NOTE — Discharge Instructions (Signed)

## 2016-12-28 NOTE — ED Notes (Signed)
Patient called MD in [place] to get appointment sooner due to problems with breathing while laying down. Patient is having to use emergency inhaler when laying down at night x 1 week. Patient wakes up in the middle of the night feeling SHOB and in the mornings. Patient has hx HTN, COPD, asthma. Still smoking. Was using chantix that was helping but stopped due to being sick. Patient does [facility] that's all the time and does not go away with inhalers. Describes as a tightness x 5-6 months. Patient reports hurting more when smoking. Denies leg pain/swelling. Denies cardiac issues, but does have fmhx in mother and father of cardiac hx.

## 2016-12-28 NOTE — ED Provider Notes (Signed)
[facility] Emergency Department Provider Note  ____________________________________________  Time seen: Approximately 8:52 PM  I have reviewed the triage vital signs and the nursing notes.   HISTORY  Chief Complaint Shortness of Breath   HPI Traci Cross is a 56 y.o. female the history of asthma, COPD, hypertension, and active smoking who presents for evaluation of shortness of breath. Patient reports that she has shortness of breath and wheezing every day of her life for more than a year. She uses her rescue inhaler multiple times a day every day. She is also on Symbicort and Spirivas at home.She continues to smoke. She reports that for the last week she has had worsening shortness of breath when she lays at night to sleep. She says she uses her inhalers and that helps a little bit and she is able to sleep for a few hours but she wakes up again feeling short of breath and wheezing. She denies history of heart failure, orthopnea, leg swelling, or worsening shortness of breath during the day. She denies URI symptoms. She does have a dry cough over the last few days. She reports constant central mild non radiating chest tightness since September 2017 that improves mildly with her inhalers and is worse when she smokes. No fever or chills. She has no personal or family history of blood clots, no recent travel or immobilization, no leg pain or swelling, no exogenous hormones, pain is not pleuritic in nature.  Past Medical History:  Diagnosis Date  . Asthma   . COPD (chronic obstructive pulmonary disease) (HCC)   . Hypertension   . Tobacco abuse     Patient Active Problem List   Diagnosis Date Noted  . Bilateral carpal tunnel syndrome 12/08/2016  . Goiter 08/12/2015  . COPD (chronic obstructive pulmonary disease) (HCC) 04/15/2015  . Asthma 04/15/2015  . Accelerated hypertension 03/26/2015  . Essential hypertension 03/26/2015  . Tobacco abuse 03/25/2015  .  COPD exacerbation (HCC) 03/25/2015  . Acute respiratory failure (HCC) 03/25/2015    Past Surgical History:  Procedure Laterality Date  . CHOLECYSTECTOMY    . TONSILLECTOMY      Prior to Admission medications   Medication Sig Start Date End Date Taking? Authorizing Provider  albuterol (PROVENTIL HFA;VENTOLIN HFA) 108 (90 Base) MCG/ACT inhaler Inhale 2 puffs into the lungs every 6 (six) hours as needed for wheezing or shortness of breath. 12/08/16   Teah Doles-Johnson, NP  budesonide-formoterol (SYMBICORT) 160-4.5 MCG/ACT inhaler Inhale 2 puffs into the lungs 2 (two) times daily. 12/08/16   Teah Doles-Johnson, NP  hydrochlorothiazide (HYDRODIURIL) 25 MG tablet Take 1 tablet (25 mg total) by mouth daily. 12/08/16   Teah Doles-Johnson, NP  lisinopril (PRINIVIL,ZESTRIL) 10 MG tablet Take 2 tablets (20 mg total) by mouth daily. 12/08/16   Teah Doles-Johnson, NP  meloxicam (MOBIC) 7.5 MG tablet Take 1 tablet (7.5 mg total) by mouth daily. 12/08/16   Teah Doles-Johnson, NP  predniSONE (DELTASONE) 20 MG tablet Take 3 tablets (60 mg total) by mouth daily. 12/28/16 01/01/17  Nita Sicklearolina Leiby Pigeon, MD  tiotropium (SPIRIVA) 18 MCG inhalation capsule Place 1 capsule (18 mcg total) into inhaler and inhale daily. 12/08/16 12/08/17  Teah Doles-Johnson, NP  varenicline (CHANTIX) 1 MG tablet Take 1 mg by mouth 2 (two) times daily.    Historical Provider, MD    Allergies Levaquin [levofloxacin]; Codeine; and Penicillins  Family History  Problem Relation Age of Onset  . Heart failure Mother   . Stroke Mother   .  Heart failure Father   . Stroke Father     Social History Social History  Substance Use Topics  . Smoking status: Current Every Day Smoker    Packs/day: 0.25    Years: 35.00    Types: Cigarettes  . Smokeless tobacco: Never Used  . Alcohol use No    Review of Systems  Constitutional: Negative for fever. Eyes: Negative for visual changes. ENT: Negative for sore throat. Neck: No neck pain    Cardiovascular: + chest pain. Respiratory: + shortness of breath. Gastrointestinal: Negative for abdominal pain, vomiting or diarrhea. Genitourinary: Negative for dysuria. Musculoskeletal: Negative for back pain. Skin: Negative for rash. Neurological: Negative for headaches, weakness or numbness. Psych: No SI or HI  ____________________________________________   PHYSICAL EXAM:  VITAL SIGNS: ED Triage Vitals  Enc Vitals Group     BP 12/28/16 1813 (!) 177/93     Pulse Rate 12/28/16 1813 82     Resp 12/28/16 1813 20     Temp 12/28/16 1813 98.6 F (37 C)     Temp Source 12/28/16 1813 Oral     SpO2 12/28/16 1813 95 %     Weight 12/28/16 1814 206 lb (93.4 kg)     Height --      Head Circumference --      Peak Flow --      Pain Score 12/28/16 1814 7     Pain Loc --      Pain Edu? --      Excl. in GC? --     Constitutional: Alert and oriented. Well appearing and in no apparent distress. HEENT:      Head: Normocephalic and atraumatic.         Eyes: Conjunctivae are normal. Sclera is non-icteric. EOMI. PERRL      Mouth/Throat: Mucous membranes are moist.       Neck: Supple with no signs of meningismus. Cardiovascular: Regular rate and rhythm. No murmurs, gallops, or rubs. 2+ symmetrical distal pulses are present in all extremities. No JVD. Respiratory: Normal respiratory effort. Decreased air movement bilaterally, no crackles or wheezes  Gastrointestinal: Soft, non tender, and non distended with positive bowel sounds. No rebound or guarding. Musculoskeletal: Nontender with normal range of motion in all extremities. No edema, cyanosis, or erythema of extremities. Neurologic: Normal speech and language. Face is symmetric. Moving all extremities. No gross focal neurologic deficits are appreciated. Skin: Skin is warm, dry and intact. No rash noted. Psychiatric: Mood and affect are normal. Speech and behavior are normal.  ____________________________________________   LABS (all  labs ordered are listed, but only abnormal results are displayed)  Labs Reviewed  BASIC METABOLIC PANEL - Abnormal; Notable for the following:       Result Value   Calcium 8.8 (*)    All other components within normal limits  CBC  TROPONIN I  TROPONIN I   ____________________________________________  EKG  ED ECG REPORT I, Nita Sickle, the attending physician, personally viewed and interpreted this ECG.  Normal sinus rhythm, rate of 82, normal intervals, normal axis, no ST elevations or depressions, T-wave flattening in aVL and V2. Unchanged from prior from June 2017 ____________________________________________  RADIOLOGY  CXR: Negative ____________________________________________   PROCEDURES  Procedure(s) performed: None Procedures Critical Care performed:  None ____________________________________________   INITIAL IMPRESSION / ASSESSMENT AND PLAN / ED COURSE   56 y.o. female the history of asthma, COPD, hypertension, and active smoking who presents for evaluation of shortness of breath worse when she lays flat  associated with wheezing x 1 week in the setting of a dry cough. Patient is well-appearing, in no respiratory distress, normal sats, she does have decreased air movement bilaterally with no crackles or wheezes. Chest x-ray with no evidence of pulmonary edema, normal JVD, no pitting edema, no signs or symptoms of heart failure. Patient has had no fever or chills, chest x-ray with no evidence of pneumonia. Presentation is concerning with a COPD exacerbation. We'll give 3 duo nebs and start patient on prednisone. No clinical suspicion for pulmonary embolism.  Clinical Course as of Dec 28 2248  Wed Dec 28, 2016  2248 Patient feels markedly improved after 3 DuoNeb abs. Is moving good air with no wheezing or crackles. Troponin 2 is negative. Remaining of her workup with no acute findings. Patient's been a bit discharged home on a prednisone taper, continue albuterol  rescue inhaler, and close follow-up with primary care doctor.  [CV]    Clinical Course User Index [CV] Nita Sickle, MD    Pertinent labs & imaging results that were available during my care of the patient were reviewed by me and considered in my medical decision making (see chart for details).    ____________________________________________   FINAL CLINICAL IMPRESSION(S) / ED DIAGNOSES  Final diagnoses:  COPD exacerbation (HCC)      NEW MEDICATIONS STARTED DURING THIS VISIT:  New Prescriptions   PREDNISONE (DELTASONE) 20 MG TABLET    Take 3 tablets (60 mg total) by mouth daily.     Note:  This document was prepared using Dragon voice recognition software and may include unintentional dictation errors.    Nita Sickle, MD 12/28/16 2250

## 2016-12-28 NOTE — ED Notes (Signed)
[facility]

## 2017-01-05 ENCOUNTER — Ambulatory Visit: Payer: Self-pay

## 2017-01-23 ENCOUNTER — Other Ambulatory Visit: Payer: Self-pay | Admitting: Specialist

## 2017-01-23 DIAGNOSIS — G5601 Carpal tunnel syndrome, right upper limb: Secondary | ICD-10-CM

## 2017-01-25 ENCOUNTER — Ambulatory Visit: Payer: Self-pay | Admitting: Specialist

## 2017-01-26 ENCOUNTER — Other Ambulatory Visit: Payer: Self-pay | Admitting: Internal Medicine

## 2017-02-21 ENCOUNTER — Other Ambulatory Visit: Payer: Self-pay | Admitting: Internal Medicine

## 2017-03-22 ENCOUNTER — Telehealth: Payer: Self-pay | Admitting: Pharmacist

## 2017-03-22 NOTE — Telephone Encounter (Signed)
03/22/17 [place] and [place] Symbicort.

## 2017-04-06 ENCOUNTER — Telehealth: Payer: Self-pay | Admitting: Pharmacist

## 2017-04-06 NOTE — Telephone Encounter (Signed)
04/06/17 Placed a refill with [facility] for Ventolin HFA, order# [id number], to ship 04-19-17.

## 2017-10-07 ENCOUNTER — Other Ambulatory Visit: Payer: Self-pay

## 2017-10-07 ENCOUNTER — Emergency Department
Admission: EM | Admit: 2017-10-07 | Discharge: 2017-10-07 | Disposition: A | Discharge status: Home / Self Care | Payer: Medicaid Other | Attending: Emergency Medicine | Admitting: Emergency Medicine

## 2017-10-07 ENCOUNTER — Encounter: Payer: Self-pay | Admitting: Emergency Medicine

## 2017-10-07 DIAGNOSIS — S199XXA Unspecified injury of neck, initial encounter: Secondary | ICD-10-CM | POA: Diagnosis present

## 2017-10-07 DIAGNOSIS — R51 Headache: Secondary | ICD-10-CM | POA: Insufficient documentation

## 2017-10-07 DIAGNOSIS — I10 Essential (primary) hypertension: Secondary | ICD-10-CM | POA: Diagnosis not present

## 2017-10-07 DIAGNOSIS — F17210 Nicotine dependence, cigarettes, uncomplicated: Secondary | ICD-10-CM | POA: Insufficient documentation

## 2017-10-07 DIAGNOSIS — M7918 Myalgia, other site: Secondary | ICD-10-CM

## 2017-10-07 DIAGNOSIS — Y998 Other external cause status: Secondary | ICD-10-CM | POA: Insufficient documentation

## 2017-10-07 DIAGNOSIS — S161XXA Strain of muscle, fascia and tendon at neck level, initial encounter: Secondary | ICD-10-CM | POA: Insufficient documentation

## 2017-10-07 DIAGNOSIS — Y92410 Unspecified street and highway as the place of occurrence of the external cause: Secondary | ICD-10-CM | POA: Diagnosis not present

## 2017-10-07 DIAGNOSIS — Z79899 Other long term (current) drug therapy: Secondary | ICD-10-CM | POA: Insufficient documentation

## 2017-10-07 DIAGNOSIS — J45909 Unspecified asthma, uncomplicated: Secondary | ICD-10-CM | POA: Diagnosis not present

## 2017-10-07 DIAGNOSIS — J449 Chronic obstructive pulmonary disease, unspecified: Secondary | ICD-10-CM | POA: Insufficient documentation

## 2017-10-07 DIAGNOSIS — Y9389 Activity, other specified: Secondary | ICD-10-CM | POA: Diagnosis not present

## 2017-10-07 MED ORDER — NAPROXEN 500 MG PO TABS: 500.0000 mg | ORAL_TABLET | Freq: Two times a day (BID) | ORAL | 0 refills | Status: DC

## 2017-10-07 MED ORDER — CYCLOBENZAPRINE HCL 10 MG PO TABS: 10.0000 mg | ORAL_TABLET | Freq: Three times a day (TID) | ORAL | 0 refills | Status: DC | PRN
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
## 2017-10-07 NOTE — Discharge Instructions (Signed)
Follow-up with your primary care provider if you are not feeling better in about a week. Take medications as prescribed. Return to the emergency department for symptoms of change or worsen if you are unable to schedule an appointment with your primary care provider.

## 2017-12-24 ENCOUNTER — Emergency Department
Admission: EM | Admit: 2017-12-24 | Discharge: 2017-12-24 | Disposition: A | Discharge status: Home / Self Care | Payer: Medicaid Other | Attending: Emergency Medicine | Admitting: Emergency Medicine

## 2017-12-24 ENCOUNTER — Emergency Department: Payer: Medicaid Other

## 2017-12-24 ENCOUNTER — Other Ambulatory Visit: Payer: Self-pay

## 2017-12-24 DIAGNOSIS — Z79899 Other long term (current) drug therapy: Secondary | ICD-10-CM | POA: Insufficient documentation

## 2017-12-24 DIAGNOSIS — J45909 Unspecified asthma, uncomplicated: Secondary | ICD-10-CM | POA: Diagnosis not present

## 2017-12-24 DIAGNOSIS — M545 Low back pain, unspecified: Secondary | ICD-10-CM

## 2017-12-24 DIAGNOSIS — J449 Chronic obstructive pulmonary disease, unspecified: Secondary | ICD-10-CM | POA: Insufficient documentation

## 2017-12-24 DIAGNOSIS — F17210 Nicotine dependence, cigarettes, uncomplicated: Secondary | ICD-10-CM | POA: Insufficient documentation

## 2017-12-24 DIAGNOSIS — I10 Essential (primary) hypertension: Secondary | ICD-10-CM | POA: Insufficient documentation

## 2017-12-24 LAB — URINALYSIS, COMPLETE (UACMP) WITH MICROSCOPIC
BILIRUBIN URINE: NEGATIVE
Bacteria, UA: NONE SEEN
GLUCOSE, UA: NEGATIVE mg/dL
Hgb urine dipstick: NEGATIVE
Ketones, ur: NEGATIVE mg/dL
Leukocytes, UA: NEGATIVE
Nitrite: NEGATIVE
Protein, ur: NEGATIVE mg/dL
Specific Gravity, Urine: 1.02 (ref 1.005–1.030)
pH: 5 (ref 5.0–8.0)

## 2017-12-24 LAB — CBC
HEMATOCRIT: 45.2 % (ref 35.0–47.0)
HEMOGLOBIN: 15.5 g/dL (ref 12.0–16.0)
MCH: 29.4 pg (ref 26.0–34.0)
MCHC: 34.2 g/dL (ref 32.0–36.0)
MCV: 86 fL (ref 80.0–100.0)
Platelets: 225 10*3/uL (ref 150–440)
RBC: 5.26 MIL/uL — ABNORMAL HIGH (ref 3.80–5.20)
RDW: 12.7 % (ref 11.5–14.5)
WBC: 7.3 10*3/uL (ref 3.6–11.0)

## 2017-12-24 LAB — BASIC METABOLIC PANEL
Anion gap: 7 (ref 5–15)
BUN: 15 mg/dL (ref 6–20)
CO2: 25 mmol/L (ref 22–32)
Calcium: 8.8 mg/dL — ABNORMAL LOW (ref 8.9–10.3)
Chloride: 107 mmol/L (ref 101–111)
Creatinine, Ser: 0.61 mg/dL (ref 0.44–1.00)
GFR calc non Af Amer: >60 mL/min (ref >60)
GLUCOSE: 101 mg/dL — AB (ref 65–99)
Potassium: 3.9 mmol/L (ref 3.5–5.1)
Sodium: 139 mmol/L (ref 135–145)

## 2017-12-24 LAB — POC URINE PREG, ED: Preg Test, Ur: NEGATIVE

## 2017-12-24 MED ORDER — HYDROCODONE-ACETAMINOPHEN 5-325 MG PO TABS
1.0000 | ORAL_TABLET | Freq: Once | ORAL | Status: AC
  Administered 2017-12-24: 1 via ORAL
  Filled 2017-12-24: qty 1

## 2017-12-24 MED ORDER — HYDROCODONE-ACETAMINOPHEN 5-325 MG PO TABS: 1.0000 | ORAL_TABLET | Freq: Four times a day (QID) | ORAL | 0 refills | Status: DC | PRN

## 2017-12-24 MED ORDER — METHOCARBAMOL 500 MG PO TABS: 500.0000 mg | ORAL_TABLET | Freq: Four times a day (QID) | ORAL | 0 refills | Status: DC

## 2017-12-24 MED ORDER — IBUPROFEN 400 MG PO TABS
600.0000 mg | ORAL_TABLET | Freq: Once | ORAL | Status: AC
  Administered 2017-12-24: 600 mg via ORAL
  Filled 2017-12-24: qty 2

## 2017-12-24 NOTE — ED Notes (Signed)
Patient transported to X-ray 

## 2017-12-24 NOTE — ED Provider Notes (Signed)
[facility] Emergency Department Provider Note  ____________________________________________   First MD Initiated Contact with Patient 12/24/17 1457     (approximate)  I have reviewed the triage vital signs and the nursing notes.   HISTORY  Chief Complaint Flank Pain   HPI Traci Cross is a 57 y.o. female who self presents to the emergency department with several days of gradual onset slowly progressive now constant moderate severity left-sided back pain radiating around towards her left lower quadrant.  She has no history of kidney stones but says she is concerned that she may have one.  She denies dysuria frequency hesitancy fevers chills or hematuria.  The pain is worse when ambulating or twisting and improved somewhat with rest.  Past Medical History:  Diagnosis Date  . Asthma   . COPD (chronic obstructive pulmonary disease) (HCC)   . Hypertension   . Tobacco abuse     Patient Active Problem List   Diagnosis Date Noted  . Bilateral carpal tunnel syndrome 12/08/2016  . Goiter 08/12/2015  . COPD (chronic obstructive pulmonary disease) (HCC) 04/15/2015  . Asthma 04/15/2015  . Accelerated hypertension 03/26/2015  . Essential hypertension 03/26/2015  . Tobacco abuse 03/25/2015  . COPD exacerbation (HCC) 03/25/2015  . Acute respiratory failure (HCC) 03/25/2015    Past Surgical History:  Procedure Laterality Date  . CHOLECYSTECTOMY    . TONSILLECTOMY      Prior to Admission medications   Medication Sig Start Date End Date Taking? Authorizing Provider  albuterol (PROVENTIL HFA;VENTOLIN HFA) 108 (90 Base) MCG/ACT inhaler Inhale 2 puffs into the lungs every 6 (six) hours as needed for wheezing or shortness of breath. 12/08/16   Doles-Johnson, Teah, NP  budesonide-formoterol (SYMBICORT) 160-4.5 MCG/ACT inhaler Inhale 2 puffs into the lungs 2 (two) times daily. 12/08/16   Doles-Johnson, Teah, NP  CHANTIX 1 MG tablet Take 1 tablet by mouth 2 times a  day. 02/21/17   Virl Axehaplin, Don C, MD  cyclobenzaprine (FLEXERIL) 10 MG tablet Take 1 tablet (10 mg total) by mouth 3 (three) times daily as needed for muscle spasms. 10/07/17   Triplett, Rulon Eisenmengerari B, FNP  hydrochlorothiazide (HYDRODIURIL) 25 MG tablet Take 1 tablet (25 mg total) by mouth daily. 12/08/16   Doles-Johnson, Teah, NP  HYDROcodone-acetaminophen (NORCO) 5-325 MG tablet Take 1 tablet by mouth every 6 (six) hours as needed for up to 7 doses for severe pain. 12/24/17   Merrily Brittleifenbark, Melvine Julin, MD  lisinopril (PRINIVIL,ZESTRIL) 10 MG tablet Take 2 tablets (20 mg total) by mouth daily. 12/08/16   Doles-Johnson, Teah, NP  meloxicam (MOBIC) 7.5 MG tablet Take 1 tablet (7.5 mg total) by mouth daily. 12/08/16   Doles-Johnson, Teah, NP  methocarbamol (ROBAXIN) 500 MG tablet Take 1 tablet (500 mg total) by mouth 4 (four) times daily. 12/24/17   Merrily Brittleifenbark, Ademola Vert, MD  naproxen (NAPROSYN) 500 MG tablet Take 1 tablet (500 mg total) by mouth 2 (two) times daily with a meal. 10/07/17   Triplett, Cari B, FNP  tiotropium (SPIRIVA) 18 MCG inhalation capsule Place 1 capsule (18 mcg total) into inhaler and inhale daily. 12/08/16 12/08/17  Doles-Johnson, Teah, NP    Allergies Levaquin [levofloxacin]; Codeine; and Penicillins  Family History  Problem Relation Age of Onset  . Heart failure Mother   . Stroke Mother   . Heart failure Father   . Stroke Father     Social History Social History   Tobacco Use  . Smoking status: Current Every Day Smoker    Packs/day:  0.25    Years: 35.00    Pack years: 8.75    Types: Cigarettes  . Smokeless tobacco: Never Used  Substance Use Topics  . Alcohol use: No    Alcohol/week: 0.0 oz  . Drug use: No    Review of Systems Constitutional: No fever/chills Eyes: No visual changes. ENT: No sore throat. Cardiovascular: Denies chest pain. Respiratory: Denies shortness of breath. Gastrointestinal: Negative for abdominal pain.  No nausea, no vomiting.  No diarrhea.  No  constipation. Genitourinary: Negative for dysuria. Musculoskeletal: Positive for back pain. Skin: Negative for rash. Neurological: Negative for headaches, focal weakness or numbness.   ____________________________________________   PHYSICAL EXAM:  VITAL SIGNS: ED Triage Vitals  Enc Vitals Group     BP 12/24/17 1321 132/82     Pulse Rate 12/24/17 1321 68     Resp 12/24/17 1321 18     Temp 12/24/17 1321 98.3 F (36.8 C)     Temp Source 12/24/17 1321 Oral     SpO2 12/24/17 1321 96 %     Weight 12/24/17 1321 200 lb (90.7 kg)     Height 12/24/17 1321 5\' 7"  (1.702 m)     Head Circumference --      Peak Flow --      Pain Score 12/24/17 1328 8     Pain Loc --      Pain Edu? --      Excl. in GC? --     Constitutional: Alert and oriented x4 appears quite uncomfortable splinting her left low back nontoxic no diaphoresis speaks in full clear sentences Eyes: PERRL EOMI. Head: Atraumatic. Nose: No congestion/rhinnorhea. Mouth/Throat: No trismus Neck: No stridor.   Cardiovascular: Normal rate, regular rhythm. Grossly normal heart sounds.  Good peripheral circulation. Respiratory: Normal respiratory effort.  No retractions. Lungs CTAB and moving good air Gastrointestinal: Soft nondistended nontender no rebound or guarding no peritonitis no McBurney's tenderness negative Rovsing's no costovertebral tenderness Musculoskeletal: Quite tender left lumbar paraspinal with spasm Neurologic:  Normal speech and language. No gross focal neurologic deficits are appreciated. 5 out of 5 hip flexion hip extension plantar flexion dorsiflexion sensation intact light touch throughout Skin:  Skin is warm, dry and intact. No rash noted. Psychiatric: Mood and affect are normal. Speech and behavior are normal.    ____________________________________________   DIFFERENTIAL includes but not limited to  Radicular pain, kidney stone, pyelonephritis, cauda  equina ____________________________________________   LABS (all labs ordered are listed, but only abnormal results are displayed)  Labs Reviewed  URINALYSIS, COMPLETE (UACMP) WITH MICROSCOPIC - Abnormal; Notable for the following components:      Result Value   Color, Urine YELLOW (*)    APPearance HAZY (*)    Squamous Epithelial / LPF 0-5 (*)    All other components within normal limits  BASIC METABOLIC PANEL - Abnormal; Notable for the following components:   Glucose, Bld 101 (*)    Calcium 8.8 (*)    All other components within normal limits  CBC - Abnormal; Notable for the following components:   RBC 5.26 (*)    All other components within normal limits  POC URINE PREG, ED    Lab work reviewed by me with no acute disease __________________________________________  EKG   ____________________________________________  RADIOLOGY  X-ray of the LS spine reviewed by me with grade 1 L4 on 5 anterior listhesis ____________________________________________   PROCEDURES  Procedure(s) performed: no  Procedures  Critical Care performed: no  Observation: no ____________________________________________  INITIAL IMPRESSION / ASSESSMENT AND PLAN / ED COURSE  Pertinent labs & imaging results that were available during my care of the patient were reviewed by me and considered in my medical decision making (see chart for details).  The patient arrives with significant left-sided flank pain.  She has no hematuria and her symptoms are not entirely consistent with nephrolithiasis.  I gave her pain control and then obtained an x-ray she has no history of low back pain showing L4 on 5 anterolisthesis which is consistent with her radicular symptoms.  We will give her a short course of opioid to get her through this acute setting and refer her to primary care for further evaluation and treatment.  Strict return precautions have been given and the patient verbalizes understanding and  agreement with the plan.      ____________________________________________   FINAL CLINICAL IMPRESSION(S) / ED DIAGNOSES  Final diagnoses:  Acute left-sided low back pain without sciatica      NEW MEDICATIONS STARTED DURING THIS VISIT:  Discharge Medication List as of 12/24/2017  4:56 PM    START taking these medications   Details  HYDROcodone-acetaminophen (NORCO) 5-325 MG tablet Take 1 tablet by mouth every 6 (six) hours as needed for up to 7 doses for severe pain., Starting Sun 12/24/2017, Print    methocarbamol (ROBAXIN) 500 MG tablet Take 1 tablet (500 mg total) by mouth 4 (four) times daily., Starting Sun 12/24/2017, Print         Note:  This document was prepared using Dragon voice recognition software and may include unintentional dictation errors.     Merrily Brittle, MD 12/28/17 1329

## 2017-12-24 NOTE — ED Notes (Signed)
ED Provider at bedside. 

## 2017-12-24 NOTE — ED Triage Notes (Signed)
Pt arrives to ED with c/o L sided pain and back pain. States pain began Friday. Denies urinary symptoms. Denies blood in urine. Nausea but denies vomiting or diarrhea. Denies hx of kidney stones, UTI.

## 2017-12-24 NOTE — Discharge Instructions (Signed)
These take your pain medication as needed for severe symptoms and more importantly make an appointment to follow-up in the pain clinic for reevaluation.  Return to the emergency department sooner for any concerns whatsoever.  It was a pleasure to take care of you today, and thank you for coming to our emergency department.  If you have any questions or concerns before leaving please ask the nurse to grab me and I'm more than happy to go through your aftercare instructions again.  If you were prescribed any opioid pain medication today such as Norco, Vicodin, Percocet, morphine, hydrocodone, or oxycodone please make sure you do not drive when you are taking this medication as it can alter your ability to drive safely.  If you have any concerns once you are home that you are not improving or are in fact getting worse before you can make it to your follow-up appointment, please do not hesitate to call 911 and come back for further evaluation.  Merrily Brittle, MD  Results for orders placed or performed during the hospital encounter of 12/24/17  Urinalysis, Complete w Microscopic  Result Value Ref Range   Color, Urine YELLOW (A) YELLOW   APPearance HAZY (A) CLEAR   Specific Gravity, Urine 1.020 1.005 - 1.030   pH 5.0 5.0 - 8.0   Glucose, UA NEGATIVE NEGATIVE mg/dL   Hgb urine dipstick NEGATIVE NEGATIVE   Bilirubin Urine NEGATIVE NEGATIVE   Ketones, ur NEGATIVE NEGATIVE mg/dL   Protein, ur NEGATIVE NEGATIVE mg/dL   Nitrite NEGATIVE NEGATIVE   Leukocytes, UA NEGATIVE NEGATIVE   RBC / HPF 0-5 0 - 5 RBC/hpf   WBC, UA 0-5 0 - 5 WBC/hpf   Bacteria, UA NONE SEEN NONE SEEN   Squamous Epithelial / LPF 0-5 (A) NONE SEEN   Mucus PRESENT   Basic metabolic panel  Result Value Ref Range   Sodium 139 135 - 145 mmol/L   Potassium 3.9 3.5 - 5.1 mmol/L   Chloride 107 101 - 111 mmol/L   CO2 25 22 - 32 mmol/L   Glucose, Bld 101 (H) 65 - 99 mg/dL   BUN 15 6 - 20 mg/dL   Creatinine, Ser 1.61 0.44 - 1.00  mg/dL   Calcium 8.8 (L) 8.9 - 10.3 mg/dL   GFR calc non Af Amer >60 >60 mL/min   GFR calc Af Amer >60 >60 mL/min   Anion gap 7 5 - 15  CBC  Result Value Ref Range   WBC 7.3 3.6 - 11.0 K/uL   RBC 5.26 (H) 3.80 - 5.20 MIL/uL   Hemoglobin 15.5 12.0 - 16.0 g/dL   HCT 09.6 04.5 - 40.9 %   MCV 86.0 80.0 - 100.0 fL   MCH 29.4 26.0 - 34.0 pg   MCHC 34.2 32.0 - 36.0 g/dL   RDW 81.1 91.4 - 78.2 %   Platelets 225 150 - 440 K/uL  POC urine preg, ED  Result Value Ref Range   Preg Test, Ur Negative Negative   Dg Lumbar Spine Complete  Result Date: 12/24/2017 CLINICAL DATA:  Pt states no injury, severe pain left lower back for 2 days without sciatica. EXAM: LUMBAR SPINE - COMPLETE 4+ VIEW COMPARISON:  06/10/2014 FINDINGS: No fracture or bone lesion. Grade 1 anterolisthesis of L4 on L5.  No other spondylolisthesis. Mild-to-moderate loss of disc height at L4-L5. Remaining disc spaces are well preserved. There are endplate osteophytes most evident along the lower thoracic spine. Mild facet degenerative changes noted at L4-L5 and L5-S1.  Soft tissues are unremarkable. IMPRESSION: 1. No fracture or acute finding. 2. Disc degenerative changes at L4-L5 where there is a grade 1 anterolisthesis. Lower lumbar spine facet degenerative change. Findings are without significant change from the prior lumbar spine radiographs. Electronically Signed   By: Amie Portlandavid  Ormond M.D.   On: 12/24/2017 15:40

## 2018-03-30 ENCOUNTER — Encounter: Payer: Self-pay | Admitting: *Deleted

## 2018-03-30 ENCOUNTER — Emergency Department
Admission: EM | Admit: 2018-03-30 | Discharge: 2018-03-31 | Disposition: A | Discharge status: Home / Self Care | Payer: Medicaid Other | Attending: Student in an Organized Health Care Education/Training Program | Admitting: Student in an Organized Health Care Education/Training Program

## 2018-03-30 ENCOUNTER — Other Ambulatory Visit: Payer: Self-pay

## 2018-03-30 ENCOUNTER — Emergency Department: Payer: Medicaid Other

## 2018-03-30 DIAGNOSIS — R079 Chest pain, unspecified: Secondary | ICD-10-CM | POA: Diagnosis present

## 2018-03-30 DIAGNOSIS — J441 Chronic obstructive pulmonary disease with (acute) exacerbation: Secondary | ICD-10-CM | POA: Diagnosis not present

## 2018-03-30 DIAGNOSIS — Z79899 Other long term (current) drug therapy: Secondary | ICD-10-CM | POA: Diagnosis not present

## 2018-03-30 DIAGNOSIS — I10 Essential (primary) hypertension: Secondary | ICD-10-CM | POA: Insufficient documentation

## 2018-03-30 DIAGNOSIS — F17210 Nicotine dependence, cigarettes, uncomplicated: Secondary | ICD-10-CM | POA: Insufficient documentation

## 2018-03-30 LAB — CBC
HEMATOCRIT: 43 % (ref 35.0–47.0)
Hemoglobin: 15.1 g/dL (ref 12.0–16.0)
MCH: 30.3 pg (ref 26.0–34.0)
MCHC: 35 g/dL (ref 32.0–36.0)
MCV: 86.5 fL (ref 80.0–100.0)
Platelets: 233 10*3/uL (ref 150–440)
RBC: 4.97 MIL/uL (ref 3.80–5.20)
RDW: 13.3 % (ref 11.5–14.5)
WBC: 7.8 10*3/uL (ref 3.6–11.0)

## 2018-03-30 LAB — TROPONIN I: Troponin I: <0.03 ng/mL (ref <0.03)

## 2018-03-30 LAB — BASIC METABOLIC PANEL
ANION GAP: 9 (ref 5–15)
BUN: 18 mg/dL (ref 6–20)
CO2: 25 mmol/L (ref 22–32)
Calcium: 10 mg/dL (ref 8.9–10.3)
Chloride: 107 mmol/L (ref 101–111)
Creatinine, Ser: 0.83 mg/dL (ref 0.44–1.00)
Glucose, Bld: 173 mg/dL — ABNORMAL HIGH (ref 65–99)
POTASSIUM: 3.5 mmol/L (ref 3.5–5.1)
SODIUM: 141 mmol/L (ref 135–145)

## 2018-03-30 MED ORDER — NICOTINE 21 MG/24HR TD PT24
MEDICATED_PATCH | TRANSDERMAL | Status: AC
  Filled 2018-03-30: qty 1

## 2018-03-30 MED ORDER — ALBUTEROL SULFATE HFA 108 (90 BASE) MCG/ACT IN AERS: 2.0000 | INHALATION_SPRAY | Freq: Four times a day (QID) | RESPIRATORY_TRACT | 2 refills | Status: DC | PRN

## 2018-03-30 MED ORDER — PREDNISONE 20 MG PO TABS
60.0000 mg | ORAL_TABLET | Freq: Once | ORAL | Status: AC
  Administered 2018-03-30: 60 mg via ORAL
  Filled 2018-03-30: qty 6

## 2018-03-30 MED ORDER — HYDROCODONE-ACETAMINOPHEN 5-325 MG PO TABS
1.0000 | ORAL_TABLET | Freq: Once | ORAL | Status: AC
  Administered 2018-03-30: 1 via ORAL
  Filled 2018-03-30: qty 1

## 2018-03-30 MED ORDER — NICOTINE 21 MG/24HR TD PT24
21.0000 mg | MEDICATED_PATCH | Freq: Once | TRANSDERMAL | Status: DC
  Administered 2018-03-30: 21 mg via TRANSDERMAL

## 2018-03-30 MED ORDER — PREDNISONE 20 MG PO TABS: 40.0000 mg | ORAL_TABLET | Freq: Every day | ORAL | 0 refills | Status: AC

## 2018-03-30 MED ORDER — IPRATROPIUM-ALBUTEROL 0.5-2.5 (3) MG/3ML IN SOLN
3.0000 mL | Freq: Once | RESPIRATORY_TRACT | Status: AC
  Administered 2018-03-30: 3 mL via RESPIRATORY_TRACT
  Filled 2018-03-30: qty 3

## 2018-03-30 NOTE — ED Notes (Signed)
ED Provider at bedside. 

## 2018-03-30 NOTE — ED Triage Notes (Signed)
Pt reports onset of left sided chest while resting this afternoon around 1400 that was worse to coughing and breathing. Ongoing at this time, "not quite as bad". Reports chronic cough.Took ibuprofen for the pain around 1400.

## 2018-03-30 NOTE — ED Notes (Signed)
Report given to Sherrie RN 

## 2018-03-30 NOTE — Discharge Instructions (Addendum)
1.  Take prednisone as prescribed. 2.  You may use albuterol inhaler 2 puffs every 6 hours as needed for wheezing or shortness of breath. 3.  Return to the ER for worsening symptoms, persistent vomiting, difficulty breathing or other concerns.

## 2018-03-30 NOTE — ED Provider Notes (Signed)
[facility] Emergency Department Provider Note    First MD Initiated Contact with Patient 03/30/18 2155     (approximate)  I have reviewed the triage vital signs and the nursing notes.   HISTORY  Chief Complaint Chest Pain    HPI Traci Cross is a 57 y.o. female with a history of asthma COPD as well as hypertension as well as cysts several decade history of 1 pack/day smoking history presents to the ER with chest pain since 2:00 today.  States is worse with coughing and breathing.  Worse whenever she takes a deep breath.  Denies any diaphoresis or pain radiating through to her back or through to her neck.  Is never had pain quite like this before.  Has had no recent antibiotics.   States the pain is mild at this time and is pain-free when she is at rest.  Worse when she palpates and presses on her chest.  No nausea or vomiting.   Past Medical History:  Diagnosis Date  . Asthma   . COPD (chronic obstructive pulmonary disease) (HCC)   . Hypertension   . Tobacco abuse    Family History  Problem Relation Age of Onset  . Heart failure Mother   . Stroke Mother   . Heart failure Father   . Stroke Father    Past Surgical History:  Procedure Laterality Date  . CHOLECYSTECTOMY    . TONSILLECTOMY     Patient Active Problem List   Diagnosis Date Noted  . Bilateral carpal tunnel syndrome 12/08/2016  . Goiter 08/12/2015  . COPD (chronic obstructive pulmonary disease) (HCC) 04/15/2015  . Asthma 04/15/2015  . Accelerated hypertension 03/26/2015  . Essential hypertension 03/26/2015  . Tobacco abuse 03/25/2015  . COPD exacerbation (HCC) 03/25/2015  . Acute respiratory failure (HCC) 03/25/2015      Prior to Admission medications   Medication Sig Start Date End Date Taking? Authorizing Provider  albuterol (PROVENTIL HFA;VENTOLIN HFA) 108 (90 Base) MCG/ACT inhaler Inhale 2 puffs into the lungs every 6 (six) hours as needed for wheezing or shortness of  breath. 12/08/16   Doles-Johnson, Teah, NP  albuterol (PROVENTIL HFA;VENTOLIN HFA) 108 (90 Base) MCG/ACT inhaler Inhale 2 puffs into the lungs every 6 (six) hours as needed for wheezing or shortness of breath. 03/30/18   Willy Eddy, MD  budesonide-formoterol [facility]) 160-4.5 MCG/ACT inhaler Inhale 2 puffs into the lungs 2 (two) times daily. 12/08/16   Doles-Johnson, Teah, NP  CHANTIX 1 MG tablet Take 1 tablet by mouth 2 times a day. 02/21/17   Virl Axe, MD  cyclobenzaprine (FLEXERIL) 10 MG tablet Take 1 tablet (10 mg total) by mouth 3 (three) times daily as needed for muscle spasms. 10/07/17   Triplett, Rulon Eisenmenger B, FNP  hydrochlorothiazide (HYDRODIURIL) 25 MG tablet Take 1 tablet (25 mg total) by mouth daily. 12/08/16   Doles-Johnson, Teah, NP  HYDROcodone-acetaminophen (NORCO) 5-325 MG tablet Take 1 tablet by mouth every 6 (six) hours as needed for up to 7 doses for severe pain. 12/24/17   Merrily Brittle, MD  lisinopril (PRINIVIL,ZESTRIL) 10 MG tablet Take 2 tablets (20 mg total) by mouth daily. 12/08/16   Doles-Johnson, Teah, NP  meloxicam (MOBIC) 7.5 MG tablet Take 1 tablet (7.5 mg total) by mouth daily. 12/08/16   Doles-Johnson, Teah, NP  methocarbamol (ROBAXIN) 500 MG tablet Take 1 tablet (500 mg total) by mouth 4 (four) times daily. 12/24/17   Merrily Brittle, MD  naproxen (NAPROSYN) 500 MG tablet Take  1 tablet (500 mg total) by mouth 2 (two) times daily with a meal. 10/07/17   Triplett, Cari B, FNP  predniSONE (DELTASONE) 20 MG tablet Take 2 tablets (40 mg total) by mouth daily for 5 days. 03/30/18 04/04/18  Willy Eddyobinson, Kuron Docken, MD  tiotropium (SPIRIVA) 18 MCG inhalation capsule Place 1 capsule (18 mcg total) into inhaler and inhale daily. 12/08/16 12/08/17  Doles-Johnson, Teah, NP    Allergies Levaquin [levofloxacin]; Codeine; and Penicillins    Social History Social History   Tobacco Use  . Smoking status: Current Every Day Smoker    Packs/day: 0.25    Years: 35.00    Pack years:  8.75    Types: Cigarettes  . Smokeless tobacco: Never Used  Substance Use Topics  . Alcohol use: No    Alcohol/week: 0.0 oz  . Drug use: No    Review of Systems Patient denies headaches, rhinorrhea, blurry vision, numbness, shortness of breath, chest pain, edema, cough, abdominal pain, nausea, vomiting, diarrhea, dysuria, fevers, rashes or hallucinations unless otherwise stated above in HPI. ____________________________________________   PHYSICAL EXAM:  VITAL SIGNS: Vitals:   03/30/18 2248 03/30/18 2249  BP:    Pulse: 90 84  Resp: (!) 29 19  Temp:    SpO2: 97% 94%    Constitutional: Alert and oriented.  Eyes: Conjunctivae are normal.  Head: Atraumatic. Nose: No congestion/rhinnorhea. Mouth/Throat: Mucous membranes are moist.   Neck: No stridor. Painless ROM.  Cardiovascular: Normal rate, regular rhythm. Grossly normal heart sounds.  Good peripheral circulation. Respiratory: Normal respiratory effort.  No retractions. Lungs with faint expiratory wheeze Gastrointestinal: Soft and nontender. No distention. No abdominal bruits. No CVA tenderness. Genitourinary:  Musculoskeletal: No lower extremity tenderness nor edema.  No joint effusions. Neurologic:  Normal speech and language. No gross focal neurologic deficits are appreciated. No facial droop Skin:  Skin is warm, dry and intact. No rash noted. Psychiatric: Mood and affect are normal. Speech and behavior are normal.  ____________________________________________   LABS (all labs ordered are listed, but only abnormal results are displayed)  Results for orders placed or performed during the hospital encounter of 03/30/18 (from the past 24 hour(s))  Basic metabolic panel     Status: Abnormal   Collection Time: 03/30/18  8:53 PM  Result Value Ref Range   Sodium 141 135 - 145 mmol/L   Potassium 3.5 3.5 - 5.1 mmol/L   Chloride 107 101 - 111 mmol/L   CO2 25 22 - 32 mmol/L   Glucose, Bld 173 (H) 65 - 99 mg/dL   BUN 18 6 -  20 mg/dL   Creatinine, Ser 1.610.83 0.44 - 1.00 mg/dL   Calcium 09.610.0 8.9 - 04.510.3 mg/dL   GFR calc non Af Amer >60 >60 mL/min   GFR calc Af Amer >60 >60 mL/min   Anion gap 9 5 - 15  CBC     Status: None   Collection Time: 03/30/18  8:53 PM  Result Value Ref Range   WBC 7.8 3.6 - 11.0 K/uL   RBC 4.97 3.80 - 5.20 MIL/uL   Hemoglobin 15.1 12.0 - 16.0 g/dL   HCT 40.943.0 81.135.0 - 91.447.0 %   MCV 86.5 80.0 - 100.0 fL   MCH 30.3 26.0 - 34.0 pg   MCHC 35.0 32.0 - 36.0 g/dL   RDW 78.213.3 95.611.5 - 21.314.5 %   Platelets 233 150 - 440 K/uL  Troponin I     Status: None   Collection Time: 03/30/18  8:53 PM  Result  Value Ref Range   Troponin I <0.03 <0.03 ng/mL   ____________________________________________  EKG My review and personal interpretation at Time: 120:47 Indication: chest pain  Rate: 115  Rhythm: sinus Axis: normal Other: normal intervals, nonspeicifc st abn ____________________________________________  RADIOLOGY  I personally reviewed all radiographic images ordered to evaluate for the above acute complaints and reviewed radiology reports and findings.  These findings were personally discussed with the patient.  Please see medical record for radiology report.  ____________________________________________   PROCEDURES  Procedure(s) performed:  Procedures    Critical Care performed: no ____________________________________________   INITIAL IMPRESSION / ASSESSMENT AND PLAN / ED COURSE  Pertinent labs & imaging results that were available during my care of the patient were reviewed by me and considered in my medical decision making (see chart for details).   DDX: ACS, pericarditis, esophagitis, boerhaaves, pe, dissection, pna, bronchitis, costochondritis   Traci Cross is a 57 y.o. who presents to the ED with symptoms as described above.  Based on presentation most clinically consistent with acute bronchitis and COPD exacerbation.  Patient has with a heart score of 3 versus 4 based on  subjectivity.  Seems very atypical for ACS but will further stratify with serial troponins.  Given her tachycardia and pleuritic chest pain will order d-dimer to further risk stratify for PE.  The patient will be placed on continuous pulse oximetry and telemetry for monitoring.  Laboratory evaluation will be sent to evaluate for the above complaints.         As part of my medical decision making, I reviewed the following data within the electronic MEDICAL RECORD NUMBER Nursing notes reviewed and incorporated, Labs reviewed, notes from prior ED visits and [place] Controlled Substance Database   ____________________________________________   FINAL CLINICAL IMPRESSION(S) / ED DIAGNOSES  Final diagnoses:  COPD with acute exacerbation (HCC)  Chest pain, unspecified type      NEW MEDICATIONS STARTED DURING THIS VISIT:  New Prescriptions   ALBUTEROL (PROVENTIL HFA;VENTOLIN HFA) 108 (90 BASE) MCG/ACT INHALER    Inhale 2 puffs into the lungs every 6 (six) hours as needed for wheezing or shortness of breath.   PREDNISONE (DELTASONE) 20 MG TABLET    Take 2 tablets (40 mg total) by mouth daily for 5 days.     Note:  This document was prepared using Dragon voice recognition software and may include unintentional dictation errors.    Willy Eddy, MD 03/30/18 [phone]

## 2018-03-31 ENCOUNTER — Emergency Department: Payer: Medicaid Other

## 2018-03-31 LAB — TROPONIN I: Troponin I: <0.03 ng/mL (ref <0.03)

## 2018-03-31 LAB — FIBRIN DERIVATIVES D-DIMER (ARMC ONLY): Fibrin derivatives D-dimer (ARMC): 2504.23 ng/mL (FEU) — ABNORMAL HIGH (ref 0.00–499.00)

## 2018-03-31 MED ORDER — IOPAMIDOL (ISOVUE-370) INJECTION 76%
100.0000 mL | Freq: Once | INTRAVENOUS | Status: AC | PRN
  Administered 2018-03-31: 100 mL via INTRAVENOUS

## 2018-03-31 NOTE — ED Notes (Signed)
Pt back from CT and unhooked to go to the bathroom.

## 2018-03-31 NOTE — ED Provider Notes (Signed)
-----------------------------------------   1:12 AM on 03/31/2018 -----------------------------------------  CT chest negative for PE.  Repeat troponin remains unremarkable.  Will discharge home per Dr. Danella Pentonobinson's prescriptions for albuterol inhaler and prednisone burst.  Strict return precautions given.  Patient verbalizes understanding and agrees with plan of care.   Irean HongSung, Kimoni Pickerill J, MD 03/31/18 [phone]

## 2018-06-14 ENCOUNTER — Emergency Department
Admission: EM | Admit: 2018-06-14 | Discharge: 2018-06-14 | Disposition: A | Discharge status: Home / Self Care | Payer: Medicaid Other | Attending: Emergency Medicine | Admitting: Emergency Medicine

## 2018-06-14 ENCOUNTER — Emergency Department: Payer: Medicaid Other

## 2018-06-14 ENCOUNTER — Other Ambulatory Visit: Payer: Self-pay

## 2018-06-14 DIAGNOSIS — Y999 Unspecified external cause status: Secondary | ICD-10-CM | POA: Diagnosis not present

## 2018-06-14 DIAGNOSIS — Y9389 Activity, other specified: Secondary | ICD-10-CM | POA: Diagnosis not present

## 2018-06-14 DIAGNOSIS — J449 Chronic obstructive pulmonary disease, unspecified: Secondary | ICD-10-CM | POA: Diagnosis not present

## 2018-06-14 DIAGNOSIS — Z79899 Other long term (current) drug therapy: Secondary | ICD-10-CM | POA: Diagnosis not present

## 2018-06-14 DIAGNOSIS — Y92410 Unspecified street and highway as the place of occurrence of the external cause: Secondary | ICD-10-CM | POA: Diagnosis not present

## 2018-06-14 DIAGNOSIS — I10 Essential (primary) hypertension: Secondary | ICD-10-CM | POA: Diagnosis not present

## 2018-06-14 DIAGNOSIS — S199XXA Unspecified injury of neck, initial encounter: Secondary | ICD-10-CM | POA: Diagnosis present

## 2018-06-14 DIAGNOSIS — F17210 Nicotine dependence, cigarettes, uncomplicated: Secondary | ICD-10-CM | POA: Diagnosis not present

## 2018-06-14 DIAGNOSIS — S161XXA Strain of muscle, fascia and tendon at neck level, initial encounter: Secondary | ICD-10-CM | POA: Diagnosis not present

## 2018-06-14 MED ORDER — IBUPROFEN 600 MG PO TABS
600.0000 mg | ORAL_TABLET | Freq: Once | ORAL | Status: AC
  Administered 2018-06-14: 600 mg via ORAL
  Filled 2018-06-14: qty 1

## 2018-06-14 MED ORDER — HYDROCODONE-ACETAMINOPHEN 5-325 MG PO TABS
1.0000 | ORAL_TABLET | Freq: Once | ORAL | Status: AC
  Administered 2018-06-14: 1 via ORAL
  Filled 2018-06-14: qty 1

## 2018-06-14 MED ORDER — IBUPROFEN 600 MG PO TABS: 600.0000 mg | ORAL_TABLET | Freq: Three times a day (TID) | ORAL | 0 refills | Status: DC | PRN

## 2018-06-14 MED ORDER — HYDROCODONE-ACETAMINOPHEN 5-325 MG PO TABS: 1.0000 | ORAL_TABLET | Freq: Four times a day (QID) | ORAL | 0 refills | Status: DC | PRN
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
## 2018-07-02 ENCOUNTER — Telehealth: Payer: Self-pay

## 2018-07-02 ENCOUNTER — Ambulatory Visit: Payer: Medicaid Other | Admitting: Adult Health

## 2018-07-02 ENCOUNTER — Encounter: Payer: Self-pay | Admitting: Adult Health

## 2018-07-02 VITALS — BP 138/84 | HR 81 | Resp 16 | Ht 67.5 in | Wt 181.2 lb

## 2018-07-02 DIAGNOSIS — I10 Essential (primary) hypertension: Secondary | ICD-10-CM

## 2018-07-02 DIAGNOSIS — Z720 Tobacco use: Secondary | ICD-10-CM | POA: Diagnosis not present

## 2018-07-02 DIAGNOSIS — J449 Chronic obstructive pulmonary disease, unspecified: Secondary | ICD-10-CM | POA: Diagnosis not present

## 2018-07-02 DIAGNOSIS — J4540 Moderate persistent asthma, uncomplicated: Secondary | ICD-10-CM

## 2018-07-02 MED ORDER — ALBUTEROL SULFATE HFA 108 (90 BASE) MCG/ACT IN AERS: 2.0000 | INHALATION_SPRAY | Freq: Four times a day (QID) | RESPIRATORY_TRACT | 2 refills | Status: DC | PRN

## 2018-07-02 MED ORDER — CARBAMIDE PEROXIDE 6.5 % OT SOLN: 5.0000 [drp] | Freq: Two times a day (BID) | OTIC | 0 refills | Status: DC

## 2018-07-02 MED ORDER — HYDROCHLOROTHIAZIDE 25 MG PO TABS: 25.0000 mg | ORAL_TABLET | Freq: Every day | ORAL | 3 refills | Status: DC

## 2018-07-02 MED ORDER — BUDESONIDE-FORMOTEROL FUMARATE 160-4.5 MCG/ACT IN AERO: 2.0000 | INHALATION_SPRAY | Freq: Two times a day (BID) | RESPIRATORY_TRACT | 2 refills | Status: DC

## 2018-07-02 NOTE — Patient Instructions (Signed)

## 2018-07-02 NOTE — Progress Notes (Signed)
[facility] [address] [place], [place] [address]  Internal MEDICINE  Office Visit Note  Patient Name: Traci Cross  [id number]  [id number]  Date of Service: 07/09/2018   Complaints/HPI Pt is here for establishment of PCP. Chief Complaint  Patient presents with  . Hypertension    new pt   . Asthma  . COPD   HPI Pt here to establish care.  She has a history of HTN, Asthma, and copd.  She reports she was being seen by Dr. Lacie Scotts.  She reports she is taking albuterol and Symbicort for her COPD and asthma.  She denies seeing a pulmonologist at this time.  She reports smoking 1 PPD.  She has expressed interest in quitting however, she is not ready at this appt. She reports having PFT's almost 2 years ago, so those will need to be repeated soon. She denies drinking alcohol and illegal drug use.   Current Medication: Outpatient Encounter Medications as of 07/02/2018  Medication Sig  . albuterol (PROVENTIL HFA;VENTOLIN HFA) 108 (90 Base) MCG/ACT inhaler Inhale 2 puffs into the lungs every 6 (six) hours as needed for wheezing or shortness of breath.  . [DISCONTINUED] albuterol (PROVENTIL HFA;VENTOLIN HFA) 108 (90 Base) MCG/ACT inhaler Inhale 2 puffs into the lungs every 6 (six) hours as needed for wheezing or shortness of breath.  . [DISCONTINUED] hydrochlorothiazide (HYDRODIURIL) 25 MG tablet Take 1 tablet (25 mg total) by mouth daily.  . budesonide-formoterol (SYMBICORT) 160-4.5 MCG/ACT inhaler Inhale 2 puffs into the lungs 2 (two) times daily.  . carbamide peroxide (DEBROX) 6.5 % OTIC solution Place 5 drops into both ears 2 (two) times daily.  . hydrochlorothiazide (HYDRODIURIL) 25 MG tablet Take 1 tablet (25 mg total) by mouth daily.  . [DISCONTINUED] albuterol (PROVENTIL HFA;VENTOLIN HFA) 108 (90 Base) MCG/ACT inhaler Inhale 2 puffs into the lungs every 6 (six) hours as needed for wheezing or shortness of breath.  . [DISCONTINUED] budesonide-formoterol (SYMBICORT) 160-4.5  MCG/ACT inhaler Inhale 2 puffs into the lungs 2 (two) times daily. (Patient not taking: Reported on 07/02/2018)  . [DISCONTINUED] CHANTIX 1 MG tablet Take 1 tablet by mouth 2 times a day. (Patient not taking: Reported on 07/02/2018)  . [DISCONTINUED] cyclobenzaprine (FLEXERIL) 10 MG tablet Take 1 tablet (10 mg total) by mouth 3 (three) times daily as needed for muscle spasms. (Patient not taking: Reported on 07/02/2018)  . [DISCONTINUED] HYDROcodone-acetaminophen (NORCO) 5-325 MG tablet Take 1 tablet by mouth every 6 (six) hours as needed for up to 7 doses for severe pain. (Patient not taking: Reported on 07/02/2018)  . [DISCONTINUED] ibuprofen (ADVIL,MOTRIN) 600 MG tablet Take 1 tablet (600 mg total) by mouth every 8 (eight) hours as needed. (Patient not taking: Reported on 07/02/2018)  . [DISCONTINUED] lisinopril (PRINIVIL,ZESTRIL) 10 MG tablet Take 2 tablets (20 mg total) by mouth daily. (Patient not taking: Reported on 07/02/2018)  . [DISCONTINUED] meloxicam (MOBIC) 7.5 MG tablet Take 1 tablet (7.5 mg total) by mouth daily. (Patient not taking: Reported on 07/02/2018)  . [DISCONTINUED] methocarbamol (ROBAXIN) 500 MG tablet Take 1 tablet (500 mg total) by mouth 4 (four) times daily. (Patient not taking: Reported on 07/02/2018)  . [DISCONTINUED] naproxen (NAPROSYN) 500 MG tablet Take 1 tablet (500 mg total) by mouth 2 (two) times daily with a meal. (Patient not taking: Reported on 07/02/2018)  . [DISCONTINUED] tiotropium (SPIRIVA) 18 MCG inhalation capsule Place 1 capsule (18 mcg total) into inhaler and inhale daily.   No facility-administered encounter medications on file as of  07/02/2018.     Surgical History: Past Surgical History:  Procedure Laterality Date  . CHOLECYSTECTOMY    . TONSILLECTOMY      Medical History: Past Medical History:  Diagnosis Date  . Asthma   . COPD (chronic obstructive pulmonary disease) (HCC)   . Hypertension   . Sleep apnea   . Tobacco abuse     Family  History: Family History  Problem Relation Age of Onset  . Heart failure Mother   . Stroke Mother   . Heart failure Father   . Stroke Father     Social History   Socioeconomic History  . Marital status: Divorced    Spouse name: Not on file  . Number of children: Not on file  . Years of education: Not on file  . Highest education level: Not on file  Occupational History  . Not on file  Social Needs  . Financial resource strain: Not on file  . Food insecurity:    Worry: Not on file    Inability: Not on file  . Transportation needs:    Medical: Not on file    Non-medical: Not on file  Tobacco Use  . Smoking status: Current Every Day Smoker    Packs/day: 0.25    Years: 35.00    Pack years: 8.75    Types: Cigarettes  . Smokeless tobacco: Never Used  Substance and Sexual Activity  . Alcohol use: No    Alcohol/week: 0.0 standard drinks  . Drug use: No  . Sexual activity: Not on file  Lifestyle  . Physical activity:    Days per week: Not on file    Minutes per session: Not on file  . Stress: Not on file  Relationships  . Social connections:    Talks on phone: Not on file    Gets together: Not on file    Attends religious service: Not on file    Active member of club or organization: Not on file    Attends meetings of clubs or organizations: Not on file    Relationship status: Not on file  . Intimate partner violence:    Fear of current or ex partner: Not on file    Emotionally abused: Not on file    Physically abused: Not on file    Forced sexual activity: Not on file  Other Topics Concern  . Not on file  Social History Narrative  . Not on file     Review of Systems  Constitutional: Negative for chills, fatigue and unexpected weight change.  HENT: Negative for congestion, rhinorrhea, sneezing and sore throat.   Eyes: Negative for photophobia, pain and redness.  Respiratory: Negative for cough, chest tightness and shortness of breath.   Cardiovascular:  Negative for chest pain and palpitations.  Gastrointestinal: Negative for abdominal pain, constipation, diarrhea, nausea and vomiting.  Endocrine: Negative.   Genitourinary: Negative for dysuria and frequency.  Musculoskeletal: Negative for arthralgias, back pain, joint swelling and neck pain.  Skin: Negative for rash.  Allergic/Immunologic: Negative.   Neurological: Negative for tremors and numbness.  Hematological: Negative for adenopathy. Does not bruise/bleed easily.  Psychiatric/Behavioral: Negative for behavioral problems and sleep disturbance. The patient is not nervous/anxious.     Vital Signs: BP 138/84   Pulse 81   Resp 16   Ht 5' 7.5" (1.715 m)   Wt 181 lb 3.2 oz (82.2 kg)   SpO2 96%   BMI 27.96 kg/m    Physical Exam  Constitutional: She is  oriented to person, place, and time. She appears well-developed and well-nourished. No distress.  HENT:  Head: Normocephalic and atraumatic.  Mouth/Throat: Oropharynx is clear and moist. No oropharyngeal exudate.  Eyes: Pupils are equal, round, and reactive to light. EOM are normal.  Neck: Normal range of motion. Neck supple. No JVD present. No tracheal deviation present. No thyromegaly present.  Cardiovascular: Normal rate, regular rhythm and normal heart sounds. Exam reveals no gallop and no friction rub.  No murmur heard. Pulmonary/Chest: Effort normal and breath sounds normal. No respiratory distress. She has no wheezes. She has no rales. She exhibits no tenderness.  Abdominal: Soft. There is no tenderness. There is no guarding.  Musculoskeletal: Normal range of motion.  Lymphadenopathy:    She has no cervical adenopathy.  Neurological: She is alert and oriented to person, place, and time. No cranial nerve deficit.  Skin: Skin is warm and dry. She is not diaphoretic.  Psychiatric: She has a normal mood and affect. Her behavior is normal. Judgment and thought content normal.  Nursing note and vitals  reviewed.  Assessment/Plan: 1. Chronic obstructive pulmonary disease, unspecified COPD type (HCC) Filled medications.  Discussed Pulmonary referral.   -PFT Future - albuterol (PROVENTIL HFA;VENTOLIN HFA) 108 (90 Base) MCG/ACT inhaler; Inhale 2 puffs into the lungs every 6 (six) hours as needed for wheezing or shortness of breath.  Dispense: 1 Inhaler; Refill: 2 - budesonide-formoterol (SYMBICORT) 160-4.5 MCG/ACT inhaler; Inhale 2 puffs into the lungs 2 (two) times daily.  Dispense: 1 Inhaler; Refill: 2 Physical blood ordered, for next visit.    2. Essential hypertension Controlled at this time.  Renewed HCTZ  3. Moderate persistent asthma, unspecified whether complicated - albuterol (PROVENTIL HFA;VENTOLIN HFA) 108 (90 Base) MCG/ACT inhaler; Inhale 2 puffs into the lungs every 6 (six) hours as needed for wheezing or shortness of breath.  Dispense: 1 Inhaler; Refill: 2  4. Tobacco abuse Smoking cessation counseling: 1. Pt acknowledges the risks of long term smoking, she will try to quite smoking. 2. Options for different medications including nicotine products, chewing gum, patch etc, Wellbutrin and Chantix is discussed 3. Goal and date of compete cessation is discussed 4. Total time spent in smoking cessation is 10 min.   General Counseling: cay kath understanding of the findings of todays visit and agrees with plan of treatment. I have discussed any further diagnostic evaluation that may be needed or ordered today. We also reviewed her medications today. she has been encouraged to call the office with any questions or concerns that should arise related to todays visit.  Orders Placed This Encounter  Procedures  . Pulmonary Function Test    Meds ordered this encounter  Medications  . albuterol (PROVENTIL HFA;VENTOLIN HFA) 108 (90 Base) MCG/ACT inhaler    Sig: Inhale 2 puffs into the lungs every 6 (six) hours as needed for wheezing or shortness of breath.    Dispense:  1  Inhaler    Refill:  2  . budesonide-formoterol (SYMBICORT) 160-4.5 MCG/ACT inhaler    Sig: Inhale 2 puffs into the lungs 2 (two) times daily.    Dispense:  1 Inhaler    Refill:  2  . hydrochlorothiazide (HYDRODIURIL) 25 MG tablet    Sig: Take 1 tablet (25 mg total) by mouth daily.    Dispense:  90 tablet    Refill:  3  . carbamide peroxide (DEBROX) 6.5 % OTIC solution    Sig: Place 5 drops into both ears 2 (two) times daily.  Dispense:  15 mL    Refill:  0    Time spent: 25 Minutes   This patient was seen by Blima Ledger AGNP-C in Collaboration with Dr Lyndon Code as a part of collaborative care agreement

## 2018-07-02 NOTE — Telephone Encounter (Signed)
Patient wants to hold off on the pft and scheduling appt with heather due to her schedule.tat

## 2018-07-26 ENCOUNTER — Other Ambulatory Visit: Payer: Self-pay | Admitting: Adult Health

## 2018-07-27 LAB — COMPREHENSIVE METABOLIC PANEL
ALT: 9 IU/L (ref 0–32)
AST: 17 IU/L (ref 0–40)
Albumin/Globulin Ratio: 1.8 (ref 1.2–2.2)
Albumin: 4.3 g/dL (ref 3.5–5.5)
Alkaline Phosphatase: 96 IU/L (ref 39–117)
BUN/Creatinine Ratio: 19 (ref 9–23)
BUN: 13 mg/dL (ref 6–24)
Bilirubin Total: 0.3 mg/dL (ref 0.0–1.2)
CALCIUM: 9.3 mg/dL (ref 8.7–10.2)
CO2: 27 mmol/L (ref 20–29)
Chloride: 97 mmol/L (ref 96–106)
Creatinine, Ser: 0.69 mg/dL (ref 0.57–1.00)
GFR, EST AFRICAN AMERICAN: 113 mL/min/{1.73_m2} (ref >59)
GFR, EST NON AFRICAN AMERICAN: 98 mL/min/{1.73_m2} (ref >59)
GLUCOSE: 87 mg/dL (ref 65–99)
Globulin, Total: 2.4 g/dL (ref 1.5–4.5)
Potassium: 4.1 mmol/L (ref 3.5–5.2)
Sodium: 140 mmol/L (ref 134–144)
TOTAL PROTEIN: 6.7 g/dL (ref 6.0–8.5)

## 2018-07-27 LAB — T4, FREE: FREE T4: 1.23 ng/dL (ref 0.82–1.77)

## 2018-07-27 LAB — CBC WITH DIFFERENTIAL/PLATELET
BASOS ABS: 0.1 10*3/uL (ref 0.0–0.2)
BASOS: 1 %
EOS (ABSOLUTE): 0.3 10*3/uL (ref 0.0–0.4)
Eos: 5 %
Hematocrit: 45.7 % (ref 34.0–46.6)
Hemoglobin: 15.1 g/dL (ref 11.1–15.9)
IMMATURE GRANS (ABS): 0 10*3/uL (ref 0.0–0.1)
IMMATURE GRANULOCYTES: 0 %
LYMPHS: 33 %
Lymphocytes Absolute: 2.1 10*3/uL (ref 0.7–3.1)
MCH: 28.7 pg (ref 26.6–33.0)
MCHC: 33 g/dL (ref 31.5–35.7)
MCV: 87 fL (ref 79–97)
Monocytes Absolute: 0.6 10*3/uL (ref 0.1–0.9)
Monocytes: 10 %
Neutrophils Absolute: 3.3 10*3/uL (ref 1.4–7.0)
Neutrophils: 51 %
Platelets: 283 10*3/uL (ref 150–450)
RBC: 5.26 x10E6/uL (ref 3.77–5.28)
RDW: 12.5 % (ref 12.3–15.4)
WBC: 6.3 10*3/uL (ref 3.4–10.8)

## 2018-07-27 LAB — LIPID PANEL WITH LDL/HDL RATIO
Cholesterol, Total: 181 mg/dL (ref 100–199)
HDL: 48 mg/dL (ref >39)
LDL CALC: 117 mg/dL — AB (ref 0–99)
LDL/HDL RATIO: 2.4 ratio (ref 0.0–3.2)
Triglycerides: 80 mg/dL (ref 0–149)
VLDL Cholesterol Cal: 16 mg/dL (ref 5–40)

## 2018-07-27 LAB — HGB A1C W/O EAG: Hgb A1c MFr Bld: 5.5 % (ref 4.8–5.6)

## 2018-07-27 LAB — B12 AND FOLATE PANEL
Folate: 7.3 ng/mL (ref >3.0)
Vitamin B-12: 318 pg/mL (ref 232–1245)

## 2018-07-27 LAB — VITAMIN D 25 HYDROXY (VIT D DEFICIENCY, FRACTURES): Vit D, 25-Hydroxy: 23.9 ng/mL — ABNORMAL LOW (ref 30.0–100.0)

## 2018-07-27 LAB — IRON AND TIBC
Iron Saturation: 20 % (ref 15–55)
Iron: 58 ug/dL (ref 27–159)
Total Iron Binding Capacity: 293 ug/dL (ref 250–450)
UIBC: 235 ug/dL (ref 131–425)

## 2018-07-27 LAB — FERRITIN: Ferritin: 130 ng/mL (ref 15–150)

## 2018-07-27 LAB — TSH: TSH: 0.445 u[IU]/mL — ABNORMAL LOW (ref 0.450–4.500)

## 2018-08-07 ENCOUNTER — Telehealth: Payer: Self-pay

## 2018-08-07 NOTE — Telephone Encounter (Signed)
PATIENT HAS BEEN ADVISED. We sschd the appt for copd and pulmonary and at checkout she did not want to or agree with scheduling her pft's, and she also requested

## 2018-08-07 NOTE — Telephone Encounter (Signed)
Patient also requested to see heather boscia for pcp for physical and meds and shoulder issues, I advised patient she needs to see ortho and HB schd for cpe will be open February, she did not want to schd follow up with Blima Ledger at moment.. Pt was under impression she would see Herbert Seta for ALL medical concerns

## 2018-10-04 ENCOUNTER — Telehealth: Payer: Self-pay | Admitting: Adult Health

## 2018-10-04 NOTE — Telephone Encounter (Signed)
LEFT MESSAGE TO RESCHEDULE 11/01/18 APPOINTMENT

## 2018-10-05 ENCOUNTER — Ambulatory Visit: Payer: Medicaid Other | Admitting: Adult Health

## 2018-10-05 ENCOUNTER — Other Ambulatory Visit: Payer: Self-pay

## 2018-10-05 ENCOUNTER — Encounter: Payer: Self-pay | Admitting: Adult Health

## 2018-10-05 VITALS — BP 135/95 | HR 72 | Temp 96.8°F | Resp 16 | Ht 67.5 in | Wt 187.0 lb

## 2018-10-05 DIAGNOSIS — R05 Cough: Secondary | ICD-10-CM | POA: Diagnosis not present

## 2018-10-05 DIAGNOSIS — R6889 Other general symptoms and signs: Secondary | ICD-10-CM

## 2018-10-05 DIAGNOSIS — J4540 Moderate persistent asthma, uncomplicated: Secondary | ICD-10-CM | POA: Diagnosis not present

## 2018-10-05 DIAGNOSIS — J069 Acute upper respiratory infection, unspecified: Secondary | ICD-10-CM

## 2018-10-05 DIAGNOSIS — J449 Chronic obstructive pulmonary disease, unspecified: Secondary | ICD-10-CM

## 2018-10-05 DIAGNOSIS — F17219 Nicotine dependence, cigarettes, with unspecified nicotine-induced disorders: Secondary | ICD-10-CM

## 2018-10-05 DIAGNOSIS — R059 Cough, unspecified: Secondary | ICD-10-CM

## 2018-10-05 LAB — POCT INFLUENZA A/B
Influenza A, POC: NEGATIVE
Influenza B, POC: NEGATIVE

## 2018-10-05 MED ORDER — PREDNISONE 10 MG PO TABS: ORAL_TABLET | ORAL | 0 refills | Status: DC

## 2018-10-05 MED ORDER — ALBUTEROL SULFATE (2.5 MG/3ML) 0.083% IN NEBU: 2.5000 mg | INHALATION_SOLUTION | Freq: Four times a day (QID) | RESPIRATORY_TRACT | 1 refills | Status: DC | PRN

## 2018-10-05 MED ORDER — ALBUTEROL SULFATE HFA 108 (90 BASE) MCG/ACT IN AERS: 2.0000 | INHALATION_SPRAY | Freq: Four times a day (QID) | RESPIRATORY_TRACT | 2 refills | Status: DC | PRN

## 2018-10-05 MED ORDER — NICOTINE 21 MG/24HR TD PT24: 21.0000 mg | MEDICATED_PATCH | Freq: Every day | TRANSDERMAL | 1 refills | Status: DC

## 2018-10-05 MED ORDER — AZITHROMYCIN 250 MG PO TABS: ORAL_TABLET | ORAL | 0 refills | Status: DC

## 2018-10-05 MED ORDER — GUAIFENESIN-DM 100-10 MG/5ML PO SYRP: 5.0000 mL | ORAL_SOLUTION | ORAL | 0 refills | Status: DC | PRN

## 2018-10-05 NOTE — Progress Notes (Signed)
[facility] [address] [place], [place] [address]  Internal MEDICINE  Office Visit Note  Patient Name: Traci Cross  [id number]  [id number]  Date of Service: 10/09/2018  Chief Complaint  Patient presents with  . Cough  . Sinusitis  . Generalized Body Aches     HPI Pt is here for a sick visit. Pt reports cough x 2 weeks.  She reports in the last few days she has been feeling worse and started coughing up yellow mucous.  She reports wheezing and having to use her inhalers more often.  She denies fever or chest pain.      Current Medication:  Outpatient Encounter Medications as of 10/05/2018  Medication Sig  . budesonide-formoterol (SYMBICORT) 160-4.5 MCG/ACT inhaler Inhale 2 puffs into the lungs 2 (two) times daily.  . carbamide peroxide (DEBROX) 6.5 % OTIC solution Place 5 drops into both ears 2 (two) times daily.  . hydrochlorothiazide (HYDRODIURIL) 25 MG tablet Take 1 tablet (25 mg total) by mouth daily.  . [DISCONTINUED] albuterol (PROVENTIL HFA;VENTOLIN HFA) 108 (90 Base) MCG/ACT inhaler Inhale 2 puffs into the lungs every 6 (six) hours as needed for wheezing or shortness of breath.  . [DISCONTINUED] albuterol (PROVENTIL HFA;VENTOLIN HFA) 108 (90 Base) MCG/ACT inhaler Inhale 2 puffs into the lungs every 6 (six) hours as needed for wheezing or shortness of breath.  Marland Kitchen. albuterol (PROVENTIL) (2.5 MG/3ML) 0.083% nebulizer solution Take 3 mLs (2.5 mg total) by nebulization every 6 (six) hours as needed for wheezing or shortness of breath.  Marland Kitchen. azithromycin (ZITHROMAX) 250 MG tablet Take as directed.  Marland Kitchen. guaiFENesin-dextromethorphan (ROBITUSSIN DM) 100-10 MG/5ML syrup Take 5 mLs by mouth every 4 (four) hours as needed for cough.  . nicotine (NICODERM CQ) 21 mg/24hr patch Place 1 patch (21 mg total) onto the skin daily.  . predniSONE (DELTASONE) 10 MG tablet Use per dose pack   No facility-administered encounter medications on file as of 10/05/2018.       Medical  History: Past Medical History:  Diagnosis Date  . Asthma   . COPD (chronic obstructive pulmonary disease) (HCC)   . Hypertension   . Sleep apnea   . Tobacco abuse      Vital Signs: BP (!) 135/95   Pulse 72   Temp (!) 96.8 F (36 C)   Resp 16   Ht 5' 7.5" (1.715 m)   Wt 187 lb (84.8 kg)   SpO2 93%   BMI 28.86 kg/m    Review of Systems  Constitutional: Negative for chills, fatigue, fever and unexpected weight change.  HENT: Positive for sinus pressure. Negative for congestion, rhinorrhea, sneezing and sore throat.   Eyes: Negative for photophobia, pain and redness.  Respiratory: Positive for cough, shortness of breath and wheezing. Negative for chest tightness.   Cardiovascular: Negative for chest pain and palpitations.  Gastrointestinal: Negative for abdominal pain, constipation, diarrhea, nausea and vomiting.  Endocrine: Negative.   Genitourinary: Negative for dysuria and frequency.  Musculoskeletal: Negative for arthralgias, back pain, joint swelling and neck pain.  Skin: Negative for rash.  Allergic/Immunologic: Negative.   Neurological: Negative for tremors and numbness.  Hematological: Negative for adenopathy. Does not bruise/bleed easily.  Psychiatric/Behavioral: Negative for behavioral problems and sleep disturbance. The patient is not nervous/anxious.     Physical Exam Vitals signs and nursing note reviewed.  Constitutional:      General: She is not in acute distress.    Appearance: She is well-developed. She is not diaphoretic.  HENT:  Head: Normocephalic and atraumatic.     Mouth/Throat:     Pharynx: No oropharyngeal exudate.  Eyes:     Pupils: Pupils are equal, round, and reactive to light.  Neck:     Musculoskeletal: Normal range of motion and neck supple.     Thyroid: No thyromegaly.     Vascular: No JVD.     Trachea: No tracheal deviation.  Cardiovascular:     Rate and Rhythm: Normal rate and regular rhythm.     Heart sounds: Normal heart  sounds. No murmur. No friction rub. No gallop.   Pulmonary:     Effort: Pulmonary effort is normal. No respiratory distress.     Breath sounds: Normal breath sounds. No wheezing or rales.  Chest:     Chest wall: No tenderness.  Abdominal:     Palpations: Abdomen is soft.     Tenderness: There is no abdominal tenderness. There is no guarding.  Musculoskeletal: Normal range of motion.  Lymphadenopathy:     Cervical: No cervical adenopathy.  Skin:    General: Skin is warm and dry.  Neurological:     Mental Status: She is alert and oriented to person, place, and time.     Cranial Nerves: No cranial nerve deficit.  Psychiatric:        Behavior: Behavior normal.        Thought Content: Thought content normal.        Judgment: Judgment normal.    Assessment/Plan: 1. Upper respiratory tract infection, unspecified type Patient given course of azithromycin.  She is instructed to take all medications and to completion.  She is not feeling better or fails to improve in 7 to 10 days she should return to clinic. - azithromycin (ZITHROMAX) 250 MG tablet; Take as directed.  Dispense: 6 tablet; Refill: 0  2. Cough Patient provided with prescription for prednisone as well as cough syrup.  She is instructed to take prednisone and concerned as directed. - guaiFENesin-dextromethorphan (ROBITUSSIN DM) 100-10 MG/5ML syrup; Take 5 mLs by mouth every 4 (four) hours as needed for cough.  Dispense: 118 mL; Refill: 0 - predniSONE (DELTASONE) 10 MG tablet; Use per dose pack  Dispense: 21 tablet; Refill: 0  3. Chronic obstructive pulmonary disease, unspecified COPD type (HCC) Patient's albuterol nebulizers refilled at this time. - albuterol (PROVENTIL) (2.5 MG/3ML) 0.083% nebulizer solution; Take 3 mLs (2.5 mg total) by nebulization every 6 (six) hours as needed for wheezing or shortness of breath.  Dispense: 150 mL; Refill: 1  4. Moderate persistent asthma, unspecified whether complicated Patient has been  managing her asthma well during this illness.  She is using nebulizers and they were refilled at this visit.  She reports the albuterol is helpful for her breathing if she does that her 6 hours.  I have encouraged her to stop smoking and she states that she is not been able to smoke since she has been sick and coughing so much   5. Cigarette nicotine dependence with nicotine-induced disorder Smoking cessation counseling: 1. Pt acknowledges the risks of long term smoking, she will try to quite smoking. 2. Options for different medications including nicotine products, chewing gum, patch etc, Wellbutrin and Chantix is discussed 3. Goal and date of compete cessation is discussed 4. Total time spent in smoking cessation is 15 min.  - nicotine (NICODERM CQ) 21 mg/24hr patch; Place 1 patch (21 mg total) onto the skin daily.  Dispense: 28 patch; Refill: 1  6. Flu-like symptoms Flu test negative  at this visit. - POCT Influenza A/B  General Counseling: Traci Cross verbalizes understanding of the findings of todays visit and agrees with plan of treatment. I have discussed any further diagnostic evaluation that may be needed or ordered today. We also reviewed her medications today. she has been encouraged to call the office with any questions or concerns that should arise related to todays visit.   Orders Placed This Encounter  Procedures  . POCT Influenza A/B    Meds ordered this encounter  Medications  . DISCONTD: albuterol (PROVENTIL HFA;VENTOLIN HFA) 108 (90 Base) MCG/ACT inhaler    Sig: Inhale 2 puffs into the lungs every 6 (six) hours as needed for wheezing or shortness of breath.    Dispense:  3 Inhaler    Refill:  2  . albuterol (PROVENTIL) (2.5 MG/3ML) 0.083% nebulizer solution    Sig: Take 3 mLs (2.5 mg total) by nebulization every 6 (six) hours as needed for wheezing or shortness of breath.    Dispense:  150 mL    Refill:  1  . guaiFENesin-dextromethorphan (ROBITUSSIN DM) 100-10 MG/5ML  syrup    Sig: Take 5 mLs by mouth every 4 (four) hours as needed for cough.    Dispense:  118 mL    Refill:  0  . azithromycin (ZITHROMAX) 250 MG tablet    Sig: Take as directed.    Dispense:  6 tablet    Refill:  0  . nicotine (NICODERM CQ) 21 mg/24hr patch    Sig: Place 1 patch (21 mg total) onto the skin daily.    Dispense:  28 patch    Refill:  1  . predniSONE (DELTASONE) 10 MG tablet    Sig: Use per dose pack    Dispense:  21 tablet    Refill:  0    Time spent: 25 Minutes  This patient was seen by Blima LedgerAdam Tyresa Prindiville AGNP-C in Collaboration with Dr Lyndon CodeFozia M Khan as a part of collaborative care agreement.  Traci Cross AGNP-C Internal Medicine

## 2018-10-05 NOTE — Patient Instructions (Signed)

## 2018-10-15 ENCOUNTER — Telehealth: Payer: Self-pay | Admitting: Adult Health

## 2018-10-15 NOTE — Telephone Encounter (Signed)
Left message to reschedule Jan. 16th [email or web address]

## 2018-10-31 ENCOUNTER — Other Ambulatory Visit: Payer: Self-pay

## 2018-10-31 ENCOUNTER — Emergency Department
Admission: EM | Admit: 2018-10-31 | Discharge: 2018-10-31 | Discharge status: Against Medical Advice | Payer: Medicaid Other | Attending: Student in an Organized Health Care Education/Training Program | Admitting: Student in an Organized Health Care Education/Training Program

## 2018-10-31 DIAGNOSIS — Z041 Encounter for examination and observation following transport accident: Secondary | ICD-10-CM | POA: Insufficient documentation

## 2018-10-31 DIAGNOSIS — Z5321 Procedure and treatment not carried out due to patient leaving prior to being seen by health care provider: Secondary | ICD-10-CM | POA: Insufficient documentation

## 2018-10-31 NOTE — ED Notes (Signed)
Called for room. No answer. Checked flex and main wait
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
## 2018-10-31 NOTE — ED Notes (Signed)
Called for room no answer

## 2018-11-01 ENCOUNTER — Emergency Department: Payer: Medicaid Other

## 2018-11-01 ENCOUNTER — Emergency Department
Admission: EM | Admit: 2018-11-01 | Discharge: 2018-11-01 | Disposition: A | Discharge status: Home / Self Care | Payer: Medicaid Other | Attending: Emergency Medicine | Admitting: Emergency Medicine

## 2018-11-01 ENCOUNTER — Encounter: Payer: Self-pay | Admitting: Emergency Medicine

## 2018-11-01 ENCOUNTER — Ambulatory Visit: Payer: Self-pay | Admitting: Nurse Practitioner

## 2018-11-01 DIAGNOSIS — Y9389 Activity, other specified: Secondary | ICD-10-CM | POA: Diagnosis not present

## 2018-11-01 DIAGNOSIS — G44319 Acute post-traumatic headache, not intractable: Secondary | ICD-10-CM

## 2018-11-01 DIAGNOSIS — I10 Essential (primary) hypertension: Secondary | ICD-10-CM | POA: Insufficient documentation

## 2018-11-01 DIAGNOSIS — Y92410 Unspecified street and highway as the place of occurrence of the external cause: Secondary | ICD-10-CM | POA: Insufficient documentation

## 2018-11-01 DIAGNOSIS — S40012A Contusion of left shoulder, initial encounter: Secondary | ICD-10-CM

## 2018-11-01 DIAGNOSIS — F17210 Nicotine dependence, cigarettes, uncomplicated: Secondary | ICD-10-CM | POA: Insufficient documentation

## 2018-11-01 DIAGNOSIS — Z9049 Acquired absence of other specified parts of digestive tract: Secondary | ICD-10-CM | POA: Insufficient documentation

## 2018-11-01 DIAGNOSIS — Z79899 Other long term (current) drug therapy: Secondary | ICD-10-CM | POA: Insufficient documentation

## 2018-11-01 DIAGNOSIS — J449 Chronic obstructive pulmonary disease, unspecified: Secondary | ICD-10-CM | POA: Insufficient documentation

## 2018-11-01 DIAGNOSIS — J45909 Unspecified asthma, uncomplicated: Secondary | ICD-10-CM | POA: Insufficient documentation

## 2018-11-01 DIAGNOSIS — S4992XA Unspecified injury of left shoulder and upper arm, initial encounter: Secondary | ICD-10-CM | POA: Diagnosis present

## 2018-11-01 DIAGNOSIS — Y998 Other external cause status: Secondary | ICD-10-CM | POA: Insufficient documentation

## 2018-11-01 MED ORDER — HYDROCODONE-ACETAMINOPHEN 5-325 MG PO TABS: 1.0000 | ORAL_TABLET | Freq: Four times a day (QID) | ORAL | 0 refills | Status: DC | PRN

## 2018-11-01 MED ORDER — HYDROCODONE-ACETAMINOPHEN 5-325 MG PO TABS
1.0000 | ORAL_TABLET | Freq: Once | ORAL | Status: AC
  Administered 2018-11-01: 1 via ORAL
  Filled 2018-11-01: qty 1

## 2018-11-01 NOTE — ED Notes (Signed)
Pt states that she needs something to show she had CT for insurance, advised her to make a mychart to be able to have access to results and showed her where this was on d/c instructions.  Went over paperwork. Unable to have her sign, pad not functioning in room
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
## 2018-11-01 NOTE — Discharge Instructions (Addendum)
Make an appointment follow-up with your primary care provider if any continued problems.  Avoid bright light and wear sunglasses when outside.  Avoid watching TV or video games.  Take pain medication only as directed.  Use ice to your left shoulder as needed for discomfort.  You will have muscle aches for approximately 4 to 5 days even with medication.  Return to the emergency department if any severe worsening of your symptoms.
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
## 2018-11-06 ENCOUNTER — Other Ambulatory Visit: Payer: Self-pay

## 2018-11-06 ENCOUNTER — Encounter: Payer: Self-pay | Admitting: Emergency Medicine

## 2018-11-06 ENCOUNTER — Emergency Department
Admission: EM | Admit: 2018-11-06 | Discharge: 2018-11-06 | Disposition: A | Discharge status: Home / Self Care | Payer: Medicaid Other | Attending: Emergency Medicine | Admitting: Emergency Medicine

## 2018-11-06 DIAGNOSIS — I10 Essential (primary) hypertension: Secondary | ICD-10-CM | POA: Insufficient documentation

## 2018-11-06 DIAGNOSIS — R42 Dizziness and giddiness: Secondary | ICD-10-CM | POA: Insufficient documentation

## 2018-11-06 DIAGNOSIS — J449 Chronic obstructive pulmonary disease, unspecified: Secondary | ICD-10-CM | POA: Insufficient documentation

## 2018-11-06 DIAGNOSIS — F17210 Nicotine dependence, cigarettes, uncomplicated: Secondary | ICD-10-CM | POA: Insufficient documentation

## 2018-11-06 DIAGNOSIS — Z79899 Other long term (current) drug therapy: Secondary | ICD-10-CM | POA: Diagnosis not present

## 2018-11-06 DIAGNOSIS — F0781 Postconcussional syndrome: Secondary | ICD-10-CM | POA: Diagnosis not present

## 2018-11-06 DIAGNOSIS — R51 Headache: Secondary | ICD-10-CM | POA: Diagnosis present

## 2018-11-06 MED ORDER — ONDANSETRON 4 MG PO TBDP
4.0000 mg | ORAL_TABLET | Freq: Once | ORAL | Status: AC
  Administered 2018-11-06: 4 mg via ORAL
  Filled 2018-11-06: qty 1

## 2018-11-06 MED ORDER — BUTALBITAL-APAP-CAFFEINE 50-325-40 MG PO TABS
2.0000 | ORAL_TABLET | Freq: Once | ORAL | Status: AC
  Administered 2018-11-06: 2 via ORAL
  Filled 2018-11-06: qty 2

## 2018-11-06 MED ORDER — BUTALBITAL-APAP-CAFFEINE 50-325-40 MG PO TABS: 1.0000 | ORAL_TABLET | Freq: Four times a day (QID) | ORAL | 0 refills | Status: DC | PRN
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
## 2018-11-06 NOTE — ED Notes (Signed)
AAOx3.  Skin warm and dry.  NAD 

## 2018-11-06 NOTE — ED Triage Notes (Signed)
Discussed with dr Alphonzo Lemmings. No protocols.

## 2018-11-12 ENCOUNTER — Ambulatory Visit: Payer: Self-pay | Admitting: Adult Health

## 2018-11-16 ENCOUNTER — Encounter: Payer: Self-pay | Admitting: Adult Health

## 2018-11-16 ENCOUNTER — Ambulatory Visit: Payer: Medicaid Other | Admitting: Adult Health

## 2018-11-16 ENCOUNTER — Ambulatory Visit: Payer: Self-pay | Admitting: Adult Health

## 2018-11-16 VITALS — BP 186/103 | HR 94 | Resp 16 | Ht 67.0 in | Wt 186.2 lb

## 2018-11-16 DIAGNOSIS — F17219 Nicotine dependence, cigarettes, with unspecified nicotine-induced disorders: Secondary | ICD-10-CM | POA: Diagnosis not present

## 2018-11-16 DIAGNOSIS — Z1239 Encounter for other screening for malignant neoplasm of breast: Secondary | ICD-10-CM

## 2018-11-16 DIAGNOSIS — J449 Chronic obstructive pulmonary disease, unspecified: Secondary | ICD-10-CM

## 2018-11-16 DIAGNOSIS — I10 Essential (primary) hypertension: Secondary | ICD-10-CM | POA: Diagnosis not present

## 2018-11-16 MED ORDER — ALBUTEROL SULFATE HFA 108 (90 BASE) MCG/ACT IN AERS: 2.0000 | INHALATION_SPRAY | RESPIRATORY_TRACT | 2 refills | Status: DC | PRN

## 2018-11-16 NOTE — Patient Instructions (Signed)
Coping with Quitting Smoking  Quitting smoking is a physical and mental challenge. You will face cravings, withdrawal symptoms, and temptation. Before quitting, work with your health care provider to make a plan that can help you cope. Preparation can help you quit and keep you from giving in. How can I cope with cravings? Cravings usually last for 5-10 minutes. If you get through it, the craving will pass. Consider taking the following actions to help you cope with cravings:  Keep your mouth busy: ? Chew sugar-free gum. ? Suck on hard candies or a straw. ? Brush your teeth.  Keep your hands and body busy: ? Immediately change to a different activity when you feel a craving. ? Squeeze or play with a ball. ? Do an activity or a hobby, like making bead jewelry, practicing needlepoint, or working with wood. ? Mix up your normal routine. ? Take a short exercise break. Go for a quick walk or run up and down stairs. ? Spend time in public places where smoking is not allowed.  Focus on doing something kind or helpful for someone else.  Call a friend or family member to talk during a craving.  Join a support group.  Call a quit line, such as 1-800-QUIT-NOW.  Talk with your health care provider about medicines that might help you cope with cravings and make quitting easier for you. How can I deal with withdrawal symptoms? Your body may experience negative effects as it tries to get used to not having nicotine in the system. These effects are called withdrawal symptoms. They may include:  Feeling hungrier than normal.  Trouble concentrating.  Irritability.  Trouble sleeping.  Feeling depressed.  Restlessness and agitation.  Craving a cigarette. To manage withdrawal symptoms:  Avoid places, people, and activities that trigger your cravings.  Remember why you want to quit.  Get plenty of sleep.  Avoid coffee and other caffeinated drinks. These may worsen some of your symptoms.  How can I handle social situations? Social situations can be difficult when you are quitting smoking, especially in the first few weeks. To manage this, you can:  Avoid parties, bars, and other social situations where people might be smoking.  Avoid alcohol.  Leave right away if you have the urge to smoke.  Explain to your family and friends that you are quitting smoking. Ask for understanding and support.  Plan activities with friends or family where smoking is not an option. What are some ways I can cope with stress? Wanting to smoke may cause stress, and stress can make you want to smoke. Find ways to manage your stress. Relaxation techniques can help. For example:  Breathe slowly and deeply, in through your nose and out through your mouth.  Listen to soothing, relaxing music.  Talk with a family member or friend about your stress.  Light a candle.  Soak in a bath or take a shower.  Think about a peaceful place. What are some ways I can prevent weight gain? Be aware that many people gain weight after they quit smoking. However, not everyone does. To keep from gaining weight, have a plan in place before you quit and stick to the plan after you quit. Your plan should include:  Having healthy snacks. When you have a craving, it may help to: ? Eat plain popcorn, crunchy carrots, celery, or other cut vegetables. ? Chew sugar-free gum.  Changing how you eat: ? Eat small portion sizes at meals. ? Eat 4-6 small meals   throughout the day instead of 1-2 large meals a day. ? Be mindful when you eat. Do not watch television or do other things that might distract you as you eat.  Exercising regularly: ? Make time to exercise each day. If you do not have time for a long workout, do short bouts of exercise for 5-10 minutes several times a day. ? Do some form of strengthening exercise, like weight lifting, and some form of aerobic exercise, like running or swimming.  Drinking plenty of  water or other low-calorie or no-calorie drinks. Drink 6-8 glasses of water daily, or as much as instructed by your health care provider. Summary  Quitting smoking is a physical and mental challenge. You will face cravings, withdrawal symptoms, and temptation to smoke again. Preparation can help you as you go through these challenges.  You can cope with cravings by keeping your mouth busy (such as by chewing gum), keeping your body and hands busy, and making calls to family, friends, or a helpline for people who want to quit smoking.  You can cope with withdrawal symptoms by avoiding places where people smoke, avoiding drinks with caffeine, and getting plenty of rest.  Ask your health care provider about the different ways to prevent weight gain, avoid stress, and handle social situations. This information is not intended to replace advice given to you by your health care provider. Make sure you discuss any questions you have with your health care provider. Document Released: 09/30/2016 Document Revised: 09/30/2016 Document Reviewed: 09/30/2016 Elsevier Interactive Patient Education  2019 Elsevier Inc.  
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
## 2018-11-19 ENCOUNTER — Ambulatory Visit: Payer: Self-pay | Admitting: Adult Health

## 2018-11-20 ENCOUNTER — Encounter: Payer: Self-pay | Admitting: Nurse Practitioner

## 2018-11-21 ENCOUNTER — Ambulatory Visit: Payer: Medicaid Other | Admitting: Internal Medicine

## 2018-11-21 DIAGNOSIS — R0602 Shortness of breath: Secondary | ICD-10-CM | POA: Diagnosis not present

## 2018-11-21 LAB — PULMONARY FUNCTION TEST

## 2018-12-04 ENCOUNTER — Ambulatory Visit: Payer: Self-pay | Admitting: Internal Medicine

## 2018-12-04 NOTE — Procedures (Signed)
[facility] [address] [place], [address]  DATE OF SERVICE: November 21, 2018  Complete Pulmonary Function Testing Interpretation:  FINDINGS:  The forced vital capacity is mildly decreased.  The FEV1 is 1.7 L which is 51% predicted and is moderately decreased.  FEV1 FVC ratio is moderately decreased.  Postbronchodilator there is no significant improvement in the FEV1 however clinical improvement may occur in the absence of spirometric improvement.  Total lung capacity is normal residual volume is normal residual volume total capacity ratio is increased DLCO is moderately decreased.  IMPRESSION:  Pulmonary function study is consistent with moderate obstructive lung disease.  There is moderate reduction in DLCO clinical correlation is recommended.  Yevonne Pax, MD [facility] Pulmonary Critical Care Medicine Sleep Medicine

## 2018-12-06 DIAGNOSIS — M546 Pain in thoracic spine: Secondary | ICD-10-CM | POA: Insufficient documentation

## 2018-12-06 DIAGNOSIS — R42 Dizziness and giddiness: Secondary | ICD-10-CM | POA: Insufficient documentation

## 2018-12-07 ENCOUNTER — Encounter: Payer: Self-pay | Admitting: Adult Health

## 2019-01-04 ENCOUNTER — Other Ambulatory Visit: Payer: Self-pay | Admitting: Student

## 2019-01-04 ENCOUNTER — Telehealth: Payer: Self-pay

## 2019-01-04 DIAGNOSIS — M5412 Radiculopathy, cervical region: Secondary | ICD-10-CM

## 2019-01-04 NOTE — Telephone Encounter (Signed)
Patient advised Her pft shows moderate obstructive lung disease. It appears a little worse than before. However, she continues to smoke so that's not unexpected. Titania

## 2019-01-15 ENCOUNTER — Other Ambulatory Visit: Payer: Self-pay | Admitting: Student

## 2019-01-15 DIAGNOSIS — M5441 Lumbago with sciatica, right side: Secondary | ICD-10-CM

## 2019-01-15 DIAGNOSIS — M5442 Lumbago with sciatica, left side: Principal | ICD-10-CM

## 2019-01-23 ENCOUNTER — Other Ambulatory Visit: Payer: Self-pay

## 2019-01-23 MED ORDER — ALBUTEROL SULFATE HFA 108 (90 BASE) MCG/ACT IN AERS: 2.0000 | INHALATION_SPRAY | Freq: Four times a day (QID) | RESPIRATORY_TRACT | 3 refills | Status: DC | PRN

## 2019-02-07 NOTE — Progress Notes (Deleted)
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 previous visit. Hip-L DG 2-3 views: No results found for this or any previous visit. Hip-R DG  Arthrogram: No results found for this or any previous visit. Hip-L DG Arthrogram: No results found for this or any previous visit. Hip-B DG Bilateral: No results found for this or any previous visit.  Knee Imaging: Knee-R MR w contrast: No results found for this or any previous visit. Knee-L MR w contrast: No results found for this or any previous visit. Knee-R MR w/wo contrast: No results found for this or any previous visit. Knee-L MR w/wo contrast: No results found for this or any previous visit. Knee-R MR wo contrast: No results found for this or any previous visit. Knee-L MR wo contrast: No results found for this or any previous visit. Knee-R CT w  contrast: No results found for this or any previous visit. Knee-L CT w contrast: No results found for this or any previous visit. Knee-R CT w/wo contrast: No results found for this or any previous visit. Knee-L CT w/wo contrast: No results found for this or any previous visit. Knee-R CT wo contrast: No results found for this or any previous visit. Knee-L CT wo contrast: No results found for this or any previous visit. Knee-R DG 1-2 views: No results found for this or any previous visit. Knee-L DG 1-2 views: No results found for this or any previous visit. Knee-R DG 3 views: No results found for this or any previous visit. Knee-L DG 3 views: No results found for this or any previous visit. Knee-R DG 4 views: No results found for this or any previous visit. Knee-L DG 4 views: No results found for this or any previous visit. Knee-R DG Arthrogram: No results found for this or any previous visit. Knee-L DG Arthrogram: No results found for this or any previous visit.  Ankle Imaging: Ankle-R DG Complete: No results found for this or any previous visit. Ankle-L DG Complete:  Results for orders placed during the hospital encounter of 02/16/08  DG Ankle Complete Left   Narrative Clinical Data: Left ankle pain, lateral swelling   LEFT ANKLE COMPLETE - 3+ VIEW   Comparison: None   Findings: Lateral greater than medial soft tissue swelling is noted with a linear avulsion fracture fragment at the distal left fibula. The ankle mortise is symmetric.  Distal tibia is intact.   IMPRESSION: Distal left fibula avulsion fracture.  Provider: Arrie Eastern    Foot Imaging: Foot-R DG Complete: No results found for this or any previous visit. Foot-L DG Complete: No results found for this or any previous visit.  Elbow Imaging: Elbow-R DG Complete: No results found for this or any previous visit. Elbow-L DG Complete: No results found for this or any previous visit.  Wrist  Imaging: Wrist-R DG Complete: No results found for this or any previous visit. Wrist-L DG Complete: No results found for this or any previous visit.  Hand Imaging: Hand-R DG Complete: No results found for this or any previous visit. Hand-L DG Complete: No results found for this or any previous visit.  Complexity Note: Imaging results reviewed. Results shared with Ms. Minichiello, using Layman's terms.                         PFSH  Drug: Ms. Huge  reports no history of drug use. Alcohol:  reports no history of alcohol use. Tobacco:  reports that she has been smoking cigarettes. She has a 8.75 pack-year smoking history. She has never used smokeless tobacco. Medical:  has a past medical history of Asthma,  COPD (chronic obstructive pulmonary disease) ([place]), Hypertension, Sleep apnea, and Tobacco abuse. Family: family history includes Heart failure in her father and mother; Stroke in her father and mother.  Past Surgical History:  Procedure Laterality Date  . CHOLECYSTECTOMY    . TONSILLECTOMY     Active Ambulatory Problems    Diagnosis Date Noted  . Tobacco abuse 03/25/2015  . COPD exacerbation ([place]) 03/25/2015  . Acute respiratory failure ([place]) 03/25/2015  . Accelerated hypertension 03/26/2015  . Essential hypertension 03/26/2015  . COPD (chronic obstructive pulmonary disease) ([place]) 04/15/2015  . Asthma 04/15/2015  . Goiter 08/12/2015  . Bilateral carpal tunnel syndrome 12/08/2016   Resolved Ambulatory Problems    Diagnosis Date Noted  . No Resolved Ambulatory Problems   Past Medical History:  Diagnosis Date  . Hypertension   . Sleep apnea    Assessment  Primary Diagnosis & Pertinent Problem List: There were no encounter diagnoses.  Visit Diagnosis (New problems to examiner): No diagnosis found. Plan of Care (Initial workup plan)  Note: Ms. Maldonado was reminded that as per protocol, today's visit has been an evaluation only. We have not taken over the patient's controlled  substance management.  Problem-specific plan: No problem-specific Assessment & Plan notes found for this encounter.  Lab Orders  No laboratory test(s) ordered today   Imaging Orders  No imaging studies ordered today   Referral Orders  No referral(s) requested today   Procedure Orders    No procedure(s) ordered today   Pharmacotherapy (current): Medications ordered:  No orders of the defined types were placed in this encounter.  Medications administered during this visit: Rozann Holts. Dredge had no medications administered during this visit.   Pharmacological management options:  Opioid Analgesics: The patient was informed that there is no guarantee that she would be a candidate for opioid analgesics. The decision will be made following CDC guidelines. This decision will be based on the results of diagnostic studies, as well as Ms. Mortellaro's risk profile.   Membrane stabilizer: To be determined at a later time  Muscle relaxant: To be determined at a later time  NSAID: To be determined at a later time  Other analgesic(s): To be determined at a later time   Interventional management options: Ms. Weimann was informed that there is no guarantee that she would be a candidate for interventional therapies. The decision will be based on the results of diagnostic studies, as well as Ms. Parcel's risk profile.  Procedure(s) under consideration:  ***   Provider-requested follow-up: No follow-ups on file.  Future Appointments  Date Time Provider [place]  02/14/2019  2:00 PM Gillis Santa, MD [facility] None    Total duration of non-face-to-face encounter: *** minutes.  Primary Care Physician: Kendell Bane, NP Location: [facility] Note by: Gillis Santa, MD Date: 02/14/2019; Time: 10:40 AM

## 2019-02-13 ENCOUNTER — Telehealth: Payer: Self-pay | Admitting: *Deleted

## 2019-02-14 ENCOUNTER — Ambulatory Visit
Payer: No Typology Code available for payment source | Admitting: Student in an Organized Health Care Education/Training Program

## 2019-02-14 ENCOUNTER — Other Ambulatory Visit: Payer: Self-pay

## 2019-02-14 NOTE — Telephone Encounter (Signed)
Have called patient multiple times, I did speak with her and she did not have time to talk.  Two other times I have left messages with patient to please return the call in order to get information for New Patient visit.

## 2019-02-18 NOTE — Progress Notes (Deleted)
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 No results found for this or any previous visit. Hip-R DG  Arthrogram: No results found for this or any previous visit. Hip-L DG Arthrogram: No results found for this or any previous visit. Hip-B DG Bilateral: No results found for this or any previous visit.  Knee Imaging: Knee-R MR w contrast: No results found for this or any previous visit. Knee-L MR w contrast: No results found for this or any previous visit. Knee-R MR w/wo contrast: No results found for this or any previous visit. Knee-L MR w/wo contrast: No results found for this or any previous visit. Knee-R MR wo contrast: No results found for this or any previous visit. Knee-L MR wo contrast: No results found for this or any previous visit. Knee-R CT w contrast: No results found for  this or any previous visit. Knee-L CT w contrast: No results found for this or any previous visit. Knee-R CT w/wo contrast: No results found for this or any previous visit. Knee-L CT w/wo contrast: No results found for this or any previous visit. Knee-R CT wo contrast: No results found for this or any previous visit. Knee-L CT wo contrast: No results found for this or any previous visit. Knee-R DG 1-2 views: No results found for this or any previous visit. Knee-L DG 1-2 views: No results found for this or any previous visit. Knee-R DG 3 views: No results found for this or any previous visit. Knee-L DG 3 views: No results found for this or any previous visit. Knee-R DG 4 views: No results found for this or any previous visit. Knee-L DG 4 views: No results found for this or any previous visit. Knee-R DG Arthrogram: No results found for this or any previous visit. Knee-L DG Arthrogram: No results found for this or any previous visit.  Ankle Imaging: Ankle-R DG Complete: No results found for this or any previous visit. Ankle-L DG Complete:  Results for orders placed during the hospital encounter of 02/16/08  DG Ankle Complete Left   Narrative Clinical Data: Left ankle pain, lateral swelling   LEFT ANKLE COMPLETE - 3+ VIEW   Comparison: None   Findings: Lateral greater than medial soft tissue swelling is noted with a linear avulsion fracture fragment at the distal left fibula. The ankle mortise is symmetric.  Distal tibia is intact.   IMPRESSION: Distal left fibula avulsion fracture.  Provider: Arrie Eastern    Foot Imaging: Foot-R DG Complete: No results found for this or any previous visit. Foot-L DG Complete: No results found for this or any previous visit.  Elbow Imaging: Elbow-R DG Complete: No results found for this or any previous visit. Elbow-L DG Complete: No results found for this or any previous visit.  Wrist Imaging: Wrist-R DG Complete: No results  found for this or any previous visit. Wrist-L DG Complete: No results found for this or any previous visit.  Hand Imaging: Hand-R DG Complete: No results found for this or any previous visit. Hand-L DG Complete: No results found for this or any previous visit.  Complexity Note: Imaging results reviewed. Results shared with Ms. Round, using Layman's terms.                         PFSH  Drug: Ms. Ehrler  reports no history of drug use. Alcohol:  reports no history of alcohol use. Tobacco:  reports that she has been smoking cigarettes. She has a 8.75 pack-year smoking history. She has never used smokeless tobacco. Medical:  has a past medical history of Asthma,  COPD (chronic obstructive pulmonary disease) ([place]), Hypertension, Sleep apnea, and Tobacco abuse. Family: family history includes Heart failure in her father and mother; Stroke in her father and mother.  Past Surgical History:  Procedure Laterality Date  . CHOLECYSTECTOMY    . TONSILLECTOMY     Active Ambulatory Problems    Diagnosis Date Noted  . Tobacco abuse 03/25/2015  . COPD exacerbation ([place]) 03/25/2015  . Acute respiratory failure ([place]) 03/25/2015  . Accelerated hypertension 03/26/2015  . Essential hypertension 03/26/2015  . COPD (chronic obstructive pulmonary disease) ([place]) 04/15/2015  . Asthma 04/15/2015  . Goiter 08/12/2015  . Bilateral carpal tunnel syndrome 12/08/2016   Resolved Ambulatory Problems    Diagnosis Date Noted  . No Resolved Ambulatory Problems   Past Medical History:  Diagnosis Date  . Hypertension   . Sleep apnea    Assessment  Primary Diagnosis & Pertinent Problem List: There were no encounter diagnoses.  Visit Diagnosis (New problems to examiner): No diagnosis found. Plan of Care (Initial workup plan)  Note: Ms. Bjelland was reminded that as per protocol, today's visit has been an evaluation only. We have not taken over the patient's controlled substance management.  Problem-specific  plan: No problem-specific Assessment & Plan notes found for this encounter.  Lab Orders  No laboratory test(s) ordered today   Imaging Orders  No imaging studies ordered today   Referral Orders  No referral(s) requested today   Procedure Orders    No procedure(s) ordered today   Pharmacotherapy (current): Medications ordered:  No orders of the defined types were placed in this encounter.  Medications administered during this visit: Samantha Ragen. Delawder had no medications administered during this visit.   Pharmacological management options:  Opioid Analgesics: The patient was informed that there is no guarantee that she would be a candidate for opioid analgesics. The decision will be made following CDC guidelines. This decision will be based on the results of diagnostic studies, as well as Ms. Mcgaughy's risk profile.   Membrane stabilizer: To be determined at a later time  Muscle relaxant: To be determined at a later time  NSAID: To be determined at a later time  Other analgesic(s): To be determined at a later time   Interventional management options: Ms. Rubalcava was informed that there is no guarantee that she would be a candidate for interventional therapies. The decision will be based on the results of diagnostic studies, as well as Ms. Quintin's risk profile.  Procedure(s) under consideration:  ***   Provider-requested follow-up: No follow-ups on file.  Future Appointments  Date Time Provider [place]  02/20/2019 10:30 AM Gillis Santa, MD [facility] None  03/21/2019  3:00 PM [facility]  03/21/2019  4:00 PM [facility]    Total duration of non-face-to-face encounter: *** minutes.  Primary Care Physician: Kendell Bane, NP Location: [facility] Note by: Gillis Santa, MD Date: 02/20/2019; Time: 1:05 PM

## 2019-02-19 ENCOUNTER — Telehealth: Payer: Self-pay

## 2019-02-19 ENCOUNTER — Ambulatory Visit: Payer: No Typology Code available for payment source

## 2019-02-19 NOTE — Telephone Encounter (Signed)
Attempted to call pat at [phone] no answer, unable to leave a message.
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
## 2019-02-20 ENCOUNTER — Ambulatory Visit: Payer: No Typology Code available for payment source

## 2019-02-20 ENCOUNTER — Ambulatory Visit: Payer: Medicaid Other | Admitting: Student in an Organized Health Care Education/Training Program

## 2019-03-21 ENCOUNTER — Ambulatory Visit
Admission: RE | Admit: 2019-03-21 | Discharge: 2019-03-21 | Disposition: A | Discharge status: Home / Self Care | Payer: Medicaid Other | Source: Ambulatory Visit | Attending: Student | Admitting: Student

## 2019-03-21 ENCOUNTER — Other Ambulatory Visit: Payer: Self-pay

## 2019-03-21 DIAGNOSIS — M5441 Lumbago with sciatica, right side: Secondary | ICD-10-CM | POA: Insufficient documentation

## 2019-03-21 DIAGNOSIS — M5442 Lumbago with sciatica, left side: Secondary | ICD-10-CM | POA: Insufficient documentation

## 2019-03-21 DIAGNOSIS — M5412 Radiculopathy, cervical region: Secondary | ICD-10-CM | POA: Insufficient documentation

## 2019-03-27 ENCOUNTER — Other Ambulatory Visit: Payer: Self-pay

## 2019-03-27 ENCOUNTER — Telehealth: Payer: Self-pay

## 2019-03-27 MED ORDER — ALBUTEROL SULFATE HFA 108 (90 BASE) MCG/ACT IN AERS: 2.0000 | INHALATION_SPRAY | Freq: Four times a day (QID) | RESPIRATORY_TRACT | 3 refills | Status: DC | PRN

## 2019-03-27 NOTE — Telephone Encounter (Signed)
Pt called that she need ventolin inhaler only not proair called [facility] phar and spoke with phar to fill ventolin  And send new rx

## 2019-04-19 ENCOUNTER — Inpatient Hospital Stay
Admission: EM | Admit: 2019-04-19 | Discharge: 2019-04-21 | DRG: 065 | Disposition: A | Discharge status: Home / Self Care | Payer: Medicaid Other | Attending: Internal Medicine | Admitting: Internal Medicine

## 2019-04-19 ENCOUNTER — Emergency Department: Payer: Medicaid Other

## 2019-04-19 ENCOUNTER — Emergency Department
Admission: EM | Admit: 2019-04-19 | Discharge: 2019-04-19 | Discharge status: Against Medical Advice | Payer: Medicaid Other | Source: Home / Self Care | Attending: Emergency Medicine | Admitting: Emergency Medicine

## 2019-04-19 ENCOUNTER — Encounter: Payer: Self-pay | Admitting: Emergency Medicine

## 2019-04-19 ENCOUNTER — Other Ambulatory Visit: Payer: Self-pay

## 2019-04-19 DIAGNOSIS — Z20828 Contact with and (suspected) exposure to other viral communicable diseases: Secondary | ICD-10-CM | POA: Diagnosis present

## 2019-04-19 DIAGNOSIS — R200 Anesthesia of skin: Secondary | ICD-10-CM | POA: Insufficient documentation

## 2019-04-19 DIAGNOSIS — G4733 Obstructive sleep apnea (adult) (pediatric): Secondary | ICD-10-CM | POA: Diagnosis present

## 2019-04-19 DIAGNOSIS — Z9981 Dependence on supplemental oxygen: Secondary | ICD-10-CM | POA: Diagnosis not present

## 2019-04-19 DIAGNOSIS — Z79899 Other long term (current) drug therapy: Secondary | ICD-10-CM

## 2019-04-19 DIAGNOSIS — I639 Cerebral infarction, unspecified: Secondary | ICD-10-CM | POA: Diagnosis present

## 2019-04-19 DIAGNOSIS — J9610 Chronic respiratory failure, unspecified whether with hypoxia or hypercapnia: Secondary | ICD-10-CM | POA: Diagnosis present

## 2019-04-19 DIAGNOSIS — G8194 Hemiplegia, unspecified affecting left nondominant side: Secondary | ICD-10-CM | POA: Diagnosis present

## 2019-04-19 DIAGNOSIS — Z881 Allergy status to other antibiotic agents status: Secondary | ICD-10-CM | POA: Diagnosis not present

## 2019-04-19 DIAGNOSIS — E876 Hypokalemia: Secondary | ICD-10-CM | POA: Diagnosis present

## 2019-04-19 DIAGNOSIS — M5412 Radiculopathy, cervical region: Secondary | ICD-10-CM | POA: Diagnosis present

## 2019-04-19 DIAGNOSIS — Z880 Allergy status to penicillin: Secondary | ICD-10-CM

## 2019-04-19 DIAGNOSIS — F17210 Nicotine dependence, cigarettes, uncomplicated: Secondary | ICD-10-CM | POA: Diagnosis present

## 2019-04-19 DIAGNOSIS — I10 Essential (primary) hypertension: Secondary | ICD-10-CM | POA: Insufficient documentation

## 2019-04-19 DIAGNOSIS — J449 Chronic obstructive pulmonary disease, unspecified: Secondary | ICD-10-CM | POA: Diagnosis present

## 2019-04-19 DIAGNOSIS — Z79891 Long term (current) use of opiate analgesic: Secondary | ICD-10-CM | POA: Diagnosis not present

## 2019-04-19 DIAGNOSIS — Z885 Allergy status to narcotic agent status: Secondary | ICD-10-CM | POA: Diagnosis not present

## 2019-04-19 DIAGNOSIS — Z716 Tobacco abuse counseling: Secondary | ICD-10-CM

## 2019-04-19 DIAGNOSIS — I63511 Cerebral infarction due to unspecified occlusion or stenosis of right middle cerebral artery: Principal | ICD-10-CM | POA: Diagnosis present

## 2019-04-19 DIAGNOSIS — Z8249 Family history of ischemic heart disease and other diseases of the circulatory system: Secondary | ICD-10-CM | POA: Diagnosis not present

## 2019-04-19 DIAGNOSIS — Z5321 Procedure and treatment not carried out due to patient leaving prior to being seen by health care provider: Secondary | ICD-10-CM | POA: Diagnosis present

## 2019-04-19 DIAGNOSIS — R531 Weakness: Secondary | ICD-10-CM | POA: Insufficient documentation

## 2019-04-19 DIAGNOSIS — I63531 Cerebral infarction due to unspecified occlusion or stenosis of right posterior cerebral artery: Secondary | ICD-10-CM

## 2019-04-19 DIAGNOSIS — Z823 Family history of stroke: Secondary | ICD-10-CM | POA: Diagnosis not present

## 2019-04-19 DIAGNOSIS — Z7951 Long term (current) use of inhaled steroids: Secondary | ICD-10-CM

## 2019-04-19 DIAGNOSIS — R29700 NIHSS score 0: Secondary | ICD-10-CM | POA: Diagnosis present

## 2019-04-19 DIAGNOSIS — J45909 Unspecified asthma, uncomplicated: Secondary | ICD-10-CM | POA: Insufficient documentation

## 2019-04-19 HISTORY — DX: Cerebral infarction, unspecified: I63.9

## 2019-04-19 LAB — CBC
HCT: 42.7 % (ref 36.0–46.0)
Hemoglobin: 14.4 g/dL (ref 12.0–15.0)
MCH: 29.4 pg (ref 26.0–34.0)
MCHC: 33.7 g/dL (ref 30.0–36.0)
MCV: 87.3 fL (ref 80.0–100.0)
Platelets: 195 10*3/uL (ref 150–400)
RBC: 4.89 MIL/uL (ref 3.87–5.11)
RDW: 12.7 % (ref 11.5–15.5)
WBC: 8 10*3/uL (ref 4.0–10.5)
nRBC: 0 % (ref 0.0–0.2)

## 2019-04-19 LAB — COMPREHENSIVE METABOLIC PANEL
ALT: 15 U/L (ref 0–44)
AST: 19 U/L (ref 15–41)
Albumin: 3.7 g/dL (ref 3.5–5.0)
Alkaline Phosphatase: 71 U/L (ref 38–126)
Anion gap: 9 (ref 5–15)
BUN: 14 mg/dL (ref 6–20)
CO2: 24 mmol/L (ref 22–32)
Calcium: 8.3 mg/dL — ABNORMAL LOW (ref 8.9–10.3)
Chloride: 108 mmol/L (ref 98–111)
Creatinine, Ser: 0.76 mg/dL (ref 0.44–1.00)
GFR calc Af Amer: >60 mL/min (ref >60)
GFR calc non Af Amer: >60 mL/min (ref >60)
Glucose, Bld: 99 mg/dL (ref 70–99)
Potassium: 3.3 mmol/L — ABNORMAL LOW (ref 3.5–5.1)
Sodium: 141 mmol/L (ref 135–145)
Total Bilirubin: 0.4 mg/dL (ref 0.3–1.2)
Total Protein: 6.5 g/dL (ref 6.5–8.1)

## 2019-04-19 LAB — DIFFERENTIAL
Abs Immature Granulocytes: 0.01 10*3/uL (ref 0.00–0.07)
Basophils Absolute: 0.1 10*3/uL (ref 0.0–0.1)
Basophils Relative: 1 %
Eosinophils Absolute: 0.3 10*3/uL (ref 0.0–0.5)
Eosinophils Relative: 4 %
Immature Granulocytes: 0 %
Lymphocytes Relative: 26 %
Lymphs Abs: 2.1 10*3/uL (ref 0.7–4.0)
Monocytes Absolute: 0.7 10*3/uL (ref 0.1–1.0)
Monocytes Relative: 8 %
Neutro Abs: 4.9 10*3/uL (ref 1.7–7.7)
Neutrophils Relative %: 61 %

## 2019-04-19 LAB — GLUCOSE, CAPILLARY: Glucose-Capillary: 103 mg/dL — ABNORMAL HIGH (ref 70–99)

## 2019-04-19 LAB — PROTIME-INR
INR: 0.9 (ref 0.8–1.2)
Prothrombin Time: 12.1 seconds (ref 11.4–15.2)

## 2019-04-19 LAB — APTT: aPTT: 31 seconds (ref 24–36)

## 2019-04-19 MED ORDER — ASPIRIN EC 81 MG PO TBEC
81.0000 mg | DELAYED_RELEASE_TABLET | Freq: Every day | ORAL | Status: DC
  Administered 2019-04-20 – 2019-04-21 (×2): 81 mg via ORAL
  Filled 2019-04-19 (×2): qty 1

## 2019-04-19 MED ORDER — CLOPIDOGREL BISULFATE 75 MG PO TABS
75.0000 mg | ORAL_TABLET | Freq: Every day | ORAL | Status: DC
  Administered 2019-04-20 – 2019-04-21 (×2): 75 mg via ORAL
  Filled 2019-04-19 (×2): qty 1

## 2019-04-19 MED ORDER — ENOXAPARIN SODIUM 40 MG/0.4ML ~~LOC~~ SOLN
40.0000 mg | SUBCUTANEOUS | Status: DC
  Administered 2019-04-20 – 2019-04-21 (×2): 40 mg via SUBCUTANEOUS
  Filled 2019-04-19 (×2): qty 0.4

## 2019-04-19 MED ORDER — ACETAMINOPHEN 160 MG/5ML PO SOLN
650.0000 mg | ORAL | Status: DC | PRN
  Filled 2019-04-19: qty 20.3

## 2019-04-19 MED ORDER — ALUM & MAG HYDROXIDE-SIMETH 200-200-20 MG/5ML PO SUSP: 30.0000 mL | ORAL | Status: DC | PRN

## 2019-04-19 MED ORDER — BUTALBITAL-APAP-CAFFEINE 50-325-40 MG PO TABS: 1.0000 | ORAL_TABLET | Freq: Four times a day (QID) | ORAL | Status: DC | PRN

## 2019-04-19 MED ORDER — SENNOSIDES-DOCUSATE SODIUM 8.6-50 MG PO TABS: 1.0000 | ORAL_TABLET | Freq: Every evening | ORAL | Status: DC | PRN

## 2019-04-19 MED ORDER — ACETAMINOPHEN 325 MG PO TABS: 650.0000 mg | ORAL_TABLET | ORAL | Status: DC | PRN

## 2019-04-19 MED ORDER — HYDROCHLOROTHIAZIDE 25 MG PO TABS
25.0000 mg | ORAL_TABLET | Freq: Every day | ORAL | Status: DC
  Administered 2019-04-20 – 2019-04-21 (×2): 25 mg via ORAL
  Filled 2019-04-19 (×2): qty 1

## 2019-04-19 MED ORDER — SODIUM CHLORIDE 0.9 % IV SOLN
INTRAVENOUS | Status: DC
  Administered 2019-04-20: 01:00:00 via INTRAVENOUS

## 2019-04-19 MED ORDER — SODIUM CHLORIDE 0.9% FLUSH: 3.0000 mL | Freq: Once | INTRAVENOUS | Status: DC

## 2019-04-19 MED ORDER — ACETAMINOPHEN 650 MG RE SUPP: 650.0000 mg | RECTAL | Status: DC | PRN

## 2019-04-19 MED ORDER — IPRATROPIUM-ALBUTEROL 0.5-2.5 (3) MG/3ML IN SOLN
3.0000 mL | Freq: Once | RESPIRATORY_TRACT | Status: AC
  Administered 2019-04-19: 3 mL via RESPIRATORY_TRACT
  Filled 2019-04-19: qty 3

## 2019-04-19 MED ORDER — NICOTINE 21 MG/24HR TD PT24: 21.0000 mg | MEDICATED_PATCH | Freq: Once | TRANSDERMAL | Status: DC

## 2019-04-19 MED ORDER — IPRATROPIUM-ALBUTEROL 0.5-2.5 (3) MG/3ML IN SOLN
3.0000 mL | RESPIRATORY_TRACT | Status: DC | PRN
  Administered 2019-04-20: 3 mL via RESPIRATORY_TRACT
  Filled 2019-04-19: qty 3

## 2019-04-19 MED ORDER — GADOBUTROL 1 MMOL/ML IV SOLN
9.0000 mL | Freq: Once | INTRAVENOUS | Status: AC | PRN
  Administered 2019-04-19: 9 mL via INTRAVENOUS

## 2019-04-19 MED ORDER — POTASSIUM CHLORIDE 20 MEQ PO PACK
40.0000 meq | PACK | Freq: Once | ORAL | Status: AC
  Administered 2019-04-20: 40 meq via ORAL
  Filled 2019-04-19: qty 2

## 2019-04-19 MED ORDER — HYDROCODONE-ACETAMINOPHEN 5-325 MG PO TABS
1.0000 | ORAL_TABLET | Freq: Four times a day (QID) | ORAL | Status: DC | PRN
  Administered 2019-04-20 – 2019-04-21 (×3): 1 via ORAL
  Filled 2019-04-19 (×3): qty 1

## 2019-04-19 MED ORDER — ONDANSETRON HCL 4 MG/2ML IJ SOLN: 4.0000 mg | INTRAMUSCULAR | Status: DC | PRN

## 2019-04-19 MED ORDER — ALBUTEROL SULFATE (2.5 MG/3ML) 0.083% IN NEBU: 2.5000 mg | INHALATION_SOLUTION | Freq: Four times a day (QID) | RESPIRATORY_TRACT | Status: DC | PRN

## 2019-04-19 MED ORDER — TRAZODONE HCL 50 MG PO TABS: 25.0000 mg | ORAL_TABLET | Freq: Every evening | ORAL | Status: DC | PRN

## 2019-04-19 MED ORDER — ASPIRIN EC 81 MG PO TBEC: 81.0000 mg | DELAYED_RELEASE_TABLET | Freq: Every day | ORAL | 11 refills | Status: AC

## 2019-04-19 MED ORDER — ACETAMINOPHEN 500 MG PO TABS
1000.0000 mg | ORAL_TABLET | Freq: Once | ORAL | Status: AC
  Administered 2019-04-19: 1000 mg via ORAL
  Filled 2019-04-19: qty 2

## 2019-04-19 MED ORDER — STROKE: EARLY STAGES OF RECOVERY BOOK
Freq: Once | Status: AC
  Administered 2019-04-20: 01:00:00

## 2019-04-19 MED ORDER — ASPIRIN 81 MG PO CHEW
324.0000 mg | CHEWABLE_TABLET | Freq: Once | ORAL | Status: AC
  Administered 2019-04-19: 324 mg via ORAL
  Filled 2019-04-19: qty 4

## 2019-04-19 NOTE — ED Notes (Signed)
MD Goodman at bedside. 

## 2019-04-19 NOTE — ED Notes (Signed)
Pt eating crackers and drinking water.

## 2019-04-19 NOTE — ED Provider Notes (Signed)
[facility] Emergency Department Provider Note  ____________________________________________   First MD Initiated Contact with Patient 04/19/19 2225     (approximate)  I have reviewed the triage vital signs and the nursing notes.   HISTORY  Chief Complaint Cerebrovascular Accident    HPI Traci Cross is a 58 y.o. female with past medical history as below here for evaluation of acute stroke.  The patient had acute onset of left arm and leg weakness and subjective numbness around 5 AM this morning.  She was seen in the ED just prior to my beginning of the shift, and was diagnosed with left PCA.  When I discussed the results with her, she left AMA due to having to go let her dogs out.  She states she returns for admission.  She does admit that she ate a large meal in between visits, and also smoked.  She has a history of chronic tobacco abuse.  Denies any new neurological symptoms.  She does have a moderate, generalized headache.  No other acute complaints.        Past Medical History:  Diagnosis Date  . Asthma   . COPD (chronic obstructive pulmonary disease) ([facility])   . Hypertension   . Sleep apnea   . Tobacco abuse     Patient Active Problem List   Diagnosis Date Noted  . Bilateral carpal tunnel syndrome 12/08/2016  . Goiter 08/12/2015  . COPD (chronic obstructive pulmonary disease) ([facility]) 04/15/2015  . Asthma 04/15/2015  . Accelerated hypertension 03/26/2015  . Essential hypertension 03/26/2015  . Tobacco abuse 03/25/2015  . COPD exacerbation ([facility]) 03/25/2015  . Acute respiratory failure ([facility]) 03/25/2015    Past Surgical History:  Procedure Laterality Date  . CHOLECYSTECTOMY    . TONSILLECTOMY      Prior to Admission medications   Medication Sig Start Date End Date Taking? Authorizing Provider  albuterol (VENTOLIN HFA) 108 (90 Base) MCG/ACT inhaler Inhale 2 puffs into the lungs every 6 (six) hours as needed for wheezing or shortness of  breath. 03/27/19   Johnna Acosta, NP  aspirin EC 81 MG tablet Take 1 tablet (81 mg total) by mouth daily. 04/19/19 04/18/20  Shaune Pollack, MD  budesonide-formoterol (SYMBICORT) 160-4.5 MCG/ACT inhaler Inhale 2 puffs into the lungs 2 (two) times daily. 07/02/18   Johnna Acosta, NP  butalbital-acetaminophen-caffeine (FIORICET, ESGIC) [phone] MG tablet Take 1 tablet by mouth every 6 (six) hours as needed for headache. 11/06/18 11/06/19  Darci Current, MD  hydrochlorothiazide (HYDRODIURIL) 25 MG tablet Take 1 tablet (25 mg total) by mouth daily. 07/02/18   Johnna Acosta, NP  HYDROcodone-acetaminophen (NORCO/VICODIN) 5-325 MG tablet Take 1 tablet by mouth every 6 (six) hours as needed for moderate pain. 11/01/18   Tommi Rumps, PA-C    Allergies Levaquin [levofloxacin], Codeine, and Penicillins  Family History  Problem Relation Age of Onset  . Heart failure Mother   . Stroke Mother   . Heart failure Father   . Stroke Father     Social History Social History   Tobacco Use  . Smoking status: Current Every Day Smoker    Packs/day: 0.25    Years: 35.00    Pack years: 8.75    Types: Cigarettes  . Smokeless tobacco: Never Used  Substance Use Topics  . Alcohol use: No    Alcohol/week: 0.0 standard drinks  . Drug use: No    Review of Systems  Review of Systems  Constitutional: Negative for  fatigue and fever.  HENT: Negative for congestion and sore throat.   Eyes: Negative for visual disturbance.  Respiratory: Negative for cough and shortness of breath.   Cardiovascular: Negative for chest pain.  Gastrointestinal: Negative for abdominal pain, diarrhea, nausea and vomiting.  Genitourinary: Negative for flank pain.  Musculoskeletal: Negative for back pain and neck pain.  Skin: Negative for rash and wound.  Neurological: Positive for weakness and numbness.  All other systems reviewed and are negative.    ____________________________________________  PHYSICAL EXAM:       VITAL SIGNS: ED Triage Vitals  Enc Vitals Group     BP 04/19/19 2211 (!) 145/82     Pulse Rate 04/19/19 2211 86     Resp 04/19/19 2211 18     Temp 04/19/19 2211 98.8 F (37.1 C)     Temp Source 04/19/19 2211 Oral     SpO2 04/19/19 2211 94 %     Weight --      Height --      Head Circumference --      Peak Flow --      Pain Score 04/19/19 2209 0     Pain Loc --      Pain Edu? --      Excl. in [place]? --      Physical Exam Vitals signs and nursing note reviewed.  Constitutional:      General: She is not in acute distress.    Appearance: She is well-developed.  HENT:     Head: Normocephalic and atraumatic.  Eyes:     Conjunctiva/sclera: Conjunctivae normal.  Neck:     Musculoskeletal: Neck supple.  Cardiovascular:     Rate and Rhythm: Normal rate and regular rhythm.     Heart sounds: Normal heart sounds. No murmur. No friction rub.  Pulmonary:     Effort: Pulmonary effort is normal. No respiratory distress.     Breath sounds: Wheezing (Mild, expiratory) present. No rales.  Abdominal:     General: There is no distension.     Palpations: Abdomen is soft.     Tenderness: There is no abdominal tenderness.  Skin:    General: Skin is warm.     Capillary Refill: Capillary refill takes less than 2 seconds.  Neurological:     Mental Status: She is alert and oriented to person, place, and time.     Motor: No abnormal muscle tone.     Comments: No cranial nerve deficits.  Strength 5 out of 5 bilateral upper and lower extremities, though subjectively weak.  Patient is amatory without difficulty.  Slightly decreased incisional light touch in the left upper and lower extremities.       ____________________________________________   LABS(all labs ordered are listed, but only abnormal results are displayed)  Labs Reviewed  NOVEL CORONAVIRUS, NAA (HOSPITAL ORDER, SEND-OUT TO REF LAB)    ____________________________________________  EKG:  None ________________________________________  RADIOLOGY All imaging, including plain films, CT scans, and ultrasounds, independently reviewed by me, and interpretations confirmed via formal radiology reads.  ED MD interpretation:     Official radiology report(s): Ct Head Wo Contrast  Result Date: 04/19/2019 CLINICAL DATA:  Patient with shortness of breath. Left-sided weakness. EXAM: CT HEAD WITHOUT CONTRAST TECHNIQUE: Contiguous axial images were obtained from the base of the skull through the vertex without intravenous contrast. COMPARISON:  None. FINDINGS: Brain: Ventricles and sulci are appropriate for patient's age. No evidence for acute cortically based infarct, intracranial hemorrhage, mass lesion or mass-effect. Vascular:  Unremarkable Skull: Intact. Sinuses/Orbits: Paranasal sinuses are well aerated. Mastoid air cells are unremarkable. Orbits are unremarkable. Other: None. IMPRESSION: No acute intracranial process. Electronically Signed   By: Annia Beltrew  Davis M.D.   On: 04/19/2019 13:51   Mr Laqueta JeanBrain W UJWo Contrast  Result Date: 04/19/2019 CLINICAL DATA:  58 year old female with left side weakness, shortness of breath. EXAM: MRI HEAD WITHOUT AND WITH CONTRAST MRI CERVICAL SPINE WITHOUT CONTRAST TECHNIQUE: Multiplanar, multiecho pulse sequences of the brain were obtained without and with intravenous contrast. Multiplanar multisequence noncontrast imaging of the cervical spine. CONTRAST:  9 milliliters Gadavist. COMPARISON:  Cervical spine MRI 03/21/2019. Head CT 1327 hours today and earlier. FINDINGS: MRI HEAD FINDINGS Brain: There is a linear 10 millimeter area of restricted diffusion in the posterior right corona radiata tracking to the subcortical white matter of the motor strip (series 5, image 40 and series 7, image 17. No associated hemorrhage or mass effect. Underlying Patchy and confluent bilateral cerebral white matter T2 and FLAIR hyperintensity, especially in the periatrial regions. No other  restricted diffusion. No midline shift, mass effect, evidence of mass lesion, ventriculomegaly, extra-axial collection or acute intracranial hemorrhage. Cervicomedullary junction and pituitary are within normal limits. Possible chronic microhemorrhage at the left parieto-occipital sulcus on series 11, image 16. No cortical encephalomalacia identified. Negative deep gray matter nuclei, brainstem and cerebellum. No abnormal enhancement identified.  No dural thickening. Vascular: Major intracranial vascular flow voids are preserved. The major dural venous sinuses appear to be enhancing and patent. Skull and upper cervical spine: Hyperostosis of the calvarium. Normal bone marrow signal at the skull base. Cervical spine described below. Sinuses/Orbits: Negative orbits.  Paranasal sinuses are clear. Other: Mastoids are clear. Grossly normal visible internal auditory structures. Scalp and face soft tissues appear negative. MRI CERVICAL SPINE FINDINGS Alignment: Improved cervical lordosis compared to 03/21/2019. No spondylolisthesis. Vertebrae: No marrow edema or evidence of acute osseous abnormality. Cord: Spinal cord signal is within normal limits at all visualized levels. Posterior Fossa, vertebral arteries, paraspinal tissues: Cervicomedullary junction is within normal limits. Preserved major vascular flow voids in the neck. Negative neck soft tissues and visible lung apices. Disc levels: Stable since 03/21/2019 and notable for: C4-C5: Circumferential disc bulge with endplate spurring and mild facet hypertrophy. Mild spinal stenosis and spinal cord mass effect. Moderate left and mild right C5 foraminal stenosis. C5-C6: Disc space loss with circumferential disc osteophyte complex eccentric to the left. Borderline to mild spinal stenosis, no cord mass effect. Moderate to severe left and mild-to-moderate right C6 foraminal stenosis. IMPRESSION: 1. Small acute white matter infarct in the posterior right MCA territory  tracking toward the motor strip. No associated hemorrhage or mass effect. 2. Moderate underlying cerebral white matter disease, most commonly due to chronic small vessel ischemia. 3. Stable MRI appearance of the cervical spine since June: - C4-C5 mild spinal stenosis and mild spinal cord mass effect but no cord signal abnormality. Up to moderate left neural foraminal stenosis. - C5-C6 moderate to severe left greater than right neural foraminal stenosis. Electronically Signed   By: Odessa FlemingH  Hall M.D.   On: 04/19/2019 19:31   Mr Cervical Spine Wo Contrast  Result Date: 04/19/2019 CLINICAL DATA:  58 year old female with left side weakness, shortness of breath. EXAM: MRI HEAD WITHOUT AND WITH CONTRAST MRI CERVICAL SPINE WITHOUT CONTRAST TECHNIQUE: Multiplanar, multiecho pulse sequences of the brain were obtained without and with intravenous contrast. Multiplanar multisequence noncontrast imaging of the cervical spine. CONTRAST:  9 milliliters Gadavist. COMPARISON:  Cervical spine  MRI 03/21/2019. Head CT 1327 hours today and earlier. FINDINGS: MRI HEAD FINDINGS Brain: There is a linear 10 millimeter area of restricted diffusion in the posterior right corona radiata tracking to the subcortical white matter of the motor strip (series 5, image 40 and series 7, image 17. No associated hemorrhage or mass effect. Underlying Patchy and confluent bilateral cerebral white matter T2 and FLAIR hyperintensity, especially in the periatrial regions. No other restricted diffusion. No midline shift, mass effect, evidence of mass lesion, ventriculomegaly, extra-axial collection or acute intracranial hemorrhage. Cervicomedullary junction and pituitary are within normal limits. Possible chronic microhemorrhage at the left parieto-occipital sulcus on series 11, image 16. No cortical encephalomalacia identified. Negative deep gray matter nuclei, brainstem and cerebellum. No abnormal enhancement identified.  No dural thickening. Vascular: Major  intracranial vascular flow voids are preserved. The major dural venous sinuses appear to be enhancing and patent. Skull and upper cervical spine: Hyperostosis of the calvarium. Normal bone marrow signal at the skull base. Cervical spine described below. Sinuses/Orbits: Negative orbits.  Paranasal sinuses are clear. Other: Mastoids are clear. Grossly normal visible internal auditory structures. Scalp and face soft tissues appear negative. MRI CERVICAL SPINE FINDINGS Alignment: Improved cervical lordosis compared to 03/21/2019. No spondylolisthesis. Vertebrae: No marrow edema or evidence of acute osseous abnormality. Cord: Spinal cord signal is within normal limits at all visualized levels. Posterior Fossa, vertebral arteries, paraspinal tissues: Cervicomedullary junction is within normal limits. Preserved major vascular flow voids in the neck. Negative neck soft tissues and visible lung apices. Disc levels: Stable since 03/21/2019 and notable for: C4-C5: Circumferential disc bulge with endplate spurring and mild facet hypertrophy. Mild spinal stenosis and spinal cord mass effect. Moderate left and mild right C5 foraminal stenosis. C5-C6: Disc space loss with circumferential disc osteophyte complex eccentric to the left. Borderline to mild spinal stenosis, no cord mass effect. Moderate to severe left and mild-to-moderate right C6 foraminal stenosis. IMPRESSION: 1. 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 No associated hemorrhage or mass effect. 2. Moderate underlying cerebral white matter disease, most commonly due to chronic small vessel ischemia. 3. Stable MRI appearance of the cervical spine since June: - C4-C5 mild spinal stenosis and mild spinal cord mass effect but no cord signal abnormality. Up to moderate left neural foraminal stenosis. - C5-C6 moderate to severe left greater than right neural foraminal stenosis. Electronically Signed   By: Odessa FlemingH  Hall  M.D.   On: 04/19/2019 19:31    ____________________________________________  PROCEDURES   Procedure(s) performed (including Critical Care):  Procedures  ____________________________________________  INITIAL IMPRESSION / MDM / ASSESSMENT AND PLAN / ED COURSE  As part of my medical decision making, I reviewed the following data within the electronic MEDICAL RECORD NUMBER Notes from prior ED visits and [place] Controlled Substance Database      *Traci Cross was evaluated in Emergency Department on 04/19/2019 for the symptoms described in the history of present illness. She was evaluated in the context of the global COVID-19 pandemic, which necessitated consideration that the patient might be at risk for infection with the SARS-CoV-2 virus that causes COVID-19. Institutional protocols and algorithms that pertain to the evaluation of patients at risk for COVID-19 are in a state of rapid change based on information released by regulatory bodies including the CDC and federal and state organizations. These policies and algorithms were followed during the patient's care in the ED.  Some ED evaluations and interventions may be delayed  as a result of limited staffing during the pandemic.*      Medical Decision Making: 58 year old female who returns to the ED after diagnosis of PCA stroke earlier today.  She has already been given aspirin.  Admit to medicine.  No new focal neurological deficits.  ____________________________________________  FINAL CLINICAL IMPRESSION(S) / ED DIAGNOSES  Final diagnoses:  Cerebrovascular accident (CVA), unspecified mechanism (HCC)     MEDICATIONS GIVEN DURING THIS VISIT:  Medications - No data to display   ED Discharge Orders    None       Note:  This document was prepared using Dragon voice recognition software and may include unintentional dictation errors.   Shaune PollackIsaacs, Hadasah Brugger, MD 04/19/19 2256

## 2019-04-19 NOTE — ED Provider Notes (Signed)
Assumed care from Dr. Archie Balboa at 3 PM. Briefly, the patient is a 58 y.o. female with PMHx of  has a past medical history of Asthma, COPD (chronic obstructive pulmonary disease) ([place]), Hypertension, Sleep apnea, and Tobacco abuse. here with arm and leg weakness/numbness. DDx broad, includes CVA, worsening cervical radiculopathy, paresthesias. MRI pending. Labs overall are reassuring. Outside of tPA or intervention window..   Labs Reviewed  GLUCOSE, CAPILLARY - Abnormal; Notable for the following components:      Result Value   Glucose-Capillary 103 (*)    All other components within normal limits  COMPREHENSIVE METABOLIC PANEL - Abnormal; Notable for the following components:   Potassium 3.3 (*)    Calcium 8.3 (*)    All other components within normal limits  PROTIME-INR  APTT  CBC  DIFFERENTIAL  CBG MONITORING, ED  I-STAT CREATININE, ED    Course of Care: MRI shows acute PCA stroke.  Patient agitated that she cannot smoke.  Offered nicotine patch, and discussed MRI results with her in detail.  She states that she cannot stay because she has to take care of dogs and things at home.  I did very long discussion with her about her new diagnosis of stroke and the very high risk of future stroke, worsening of her current stroke, arrhythmia, with subsequent severe disability including paralysis, loss of sensation, and death.  She is awake, alert, not intoxicated, and demonstrates competency.  She is able to tell me that she had an acute stroke, what happened, and her risk factors as well as the risks of leaving.  She states she will return. Signed out AMA.     Duffy Bruce, MD 04/19/19 2022

## 2019-04-19 NOTE — ED Notes (Signed)
Pt back to the er after signing out AMA. Pt suffered a stroke earlier but needed to leave to get her animals taken care of. MD at bedside.

## 2019-04-19 NOTE — ED Notes (Signed)
Patient transported to MRI 

## 2019-04-19 NOTE — ED Triage Notes (Signed)
Pt states she woke up at 0500 am with SOB, didn't recognize any weakness at that time, put her O2 on, and went back to sleep. States she woke up again at 0900 and "felt weird" on her left side, positive left sided weakness, denies numbness, speech clear, face symmetrical, BG 103 in triage. Grips equal. Pt has COPD and is sob from walking from parking lot.

## 2019-04-19 NOTE — ED Triage Notes (Signed)
Patient states that she was here earlier and diagnosed with a stroke and was supposed to be admitted. Patient states that she left to take care of her dogs and is now back to be admitted.
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
## 2019-04-19 NOTE — Discharge Instructions (Addendum)
As we discussed, you had a new stroke today.  We recommend admission.  Leaving right now puts you at very high risk for worsening of your current stroke or possibly a new stroke, with subsequent paralysis, severe pain, or death.  Start taking a low-dose aspirin daily regardless.  I recommend that you return to the ER immediately after you are able to arrange care for your dogs, or if you change your mind.

## 2019-04-19 NOTE — ED Notes (Addendum)
ED TO INPATIENT HANDOFF REPORT  ED Nurse Name and Phone #: Geraldine Contras [phone]  X Name/Age/Gender Traci Cross 58 y.o. female Room/Bed: ED24A/ED24A  Code Status   Code Status: Full Code  Home/SNF/Other Home Patient oriented to: self, place, time and situation Is this baseline? Yes   Triage Complete: Triage complete  Chief Complaint Stroke  Triage Note Patient states that she was here earlier and diagnosed with a stroke and was supposed to be admitted. Patient states that she left to take care of her dogs and is now back to be admitted.    Allergies Allergies  Allergen Reactions  . Levaquin [Levofloxacin] Hives    Dr notified.   . Codeine   . Penicillins     Level of Care/Admitting Diagnosis ED Disposition    ED Disposition Condition Comment   Admit  Hospital Area: [facility] [[id number]]  Level of Care: Med-Surg [16]  Covid Evaluation: Asymptomatic Screening Protocol (No Symptoms)  Diagnosis: Acute CVA (cerebrovascular accident) [facility]) [[id number]]  Admitting Physician: Hannah Beat [[id number]]  Attending Physician: Hannah Beat [[id number]]  Estimated length of stay: past midnight tomorrow  Certification:: I certify this patient will need inpatient services for at least 2 midnights  PT Class (Do Not Modify): Inpatient [101]  PT Acc Code (Do Not Modify): Private [1]       B Medical/Surgery History Past Medical History:  Diagnosis Date  . Asthma   . COPD (chronic obstructive pulmonary disease) (HCC)   . Hypertension   . Sleep apnea   . Tobacco abuse    Past Surgical History:  Procedure Laterality Date  . CHOLECYSTECTOMY    . TONSILLECTOMY       A IV Location/Drains/Wounds Patient Lines/Drains/Airways Status   Active Line/Drains/Airways    Name:   Placement date:   Placement time:   Site:   Days:   Peripheral IV 04/19/19 Right Forearm   04/19/19    2232    Forearm   less than 1          Intake/Output Last 24 hours No intake or output data  in the 24 hours ending 04/19/19 2347  Labs/Imaging Results for orders placed or performed during the hospital encounter of 04/19/19 (from the past 48 hour(s))  Glucose, capillary     Status: Abnormal   Collection Time: 04/19/19  1:00 PM  Result Value Ref Range   Glucose-Capillary 103 (H) 70 - 99 mg/dL  Protime-INR     Status: None   Collection Time: 04/19/19  1:09 PM  Result Value Ref Range   Prothrombin Time 12.1 11.4 - 15.2 seconds   INR 0.9 0.8 - 1.2    Comment: (NOTE) INR goal varies based on device and disease states. Performed at [facility], [address] [place], [place] [address]   APTT     Status: None   Collection Time: 04/19/19  1:09 PM  Result Value Ref Range   aPTT 31 24 - 36 seconds    Comment: Performed at [facility], [address] [place], [place] [address]  CBC     Status: None   Collection Time: 04/19/19  1:09 PM  Result Value Ref Range   WBC 8.0 4.0 - 10.5 K/uL   RBC 4.89 3.87 - 5.11 MIL/uL   Hemoglobin 14.4 12.0 - 15.0 g/dL   HCT 38.7 56.4 - 33.2 %   MCV 87.3 80.0 - 100.0 fL   MCH 29.4 26.0 - 34.0 pg  MCHC 33.7 30.0 - 36.0 g/dL   RDW 16.112.7 09.611.5 - 04.515.5 %   Platelets 195 150 - 400 K/uL   nRBC 0.0 0.0 - 0.2 %    Comment: Performed at [facility], [address] [place], [place] [address]  Differential     Status: None   Collection Time: 04/19/19  1:09 PM  Result Value Ref Range   Neutrophils Relative % 61 %   Neutro Abs 4.9 1.7 - 7.7 K/uL   Lymphocytes Relative 26 %   Lymphs Abs 2.1 0.7 - 4.0 K/uL   Monocytes Relative 8 %   Monocytes Absolute 0.7 0.1 - 1.0 K/uL   Eosinophils Relative 4 %   Eosinophils Absolute 0.3 0.0 - 0.5 K/uL   Basophils Relative 1 %   Basophils Absolute 0.1 0.0 - 0.1 K/uL   Immature Granulocytes 0 %   Abs Immature Granulocytes 0.01 0.00 - 0.07 K/uL    Comment: Performed at [facility], [address] [place], [place] [address]  Comprehensive metabolic panel     Status:  Abnormal   Collection Time: 04/19/19  1:09 PM  Result Value Ref Range   Sodium 141 135 - 145 mmol/L   Potassium 3.3 (L) 3.5 - 5.1 mmol/L   Chloride 108 98 - 111 mmol/L   CO2 24 22 - 32 mmol/L   Glucose, Bld 99 70 - 99 mg/dL   BUN 14 6 - 20 mg/dL   Creatinine, Ser 8.290.76 0.44 - 1.00 mg/dL   Calcium 8.3 (L) 8.9 - 10.3 mg/dL   Total Protein 6.5 6.5 - 8.1 g/dL   Albumin 3.7 3.5 - 5.0 g/dL   AST 19 15 - 41 U/L   ALT 15 0 - 44 U/L   Alkaline Phosphatase 71 38 - 126 U/L   Total Bilirubin 0.4 0.3 - 1.2 mg/dL   GFR calc non Af Amer >60 >60 mL/min   GFR calc Af Amer >60 >60 mL/min   Anion gap 9 5 - 15    Comment: Performed at [facility], [address] [place], [place] [address]   Ct Head Wo Contrast  Result Date: 04/19/2019 CLINICAL DATA:  Patient with shortness of breath. Left-sided weakness. EXAM: CT HEAD WITHOUT CONTRAST TECHNIQUE: Contiguous axial images were obtained from the base of the skull through the vertex without intravenous contrast. COMPARISON:  None. FINDINGS: Brain: Ventricles and sulci are appropriate for patient's age. No evidence for acute cortically based infarct, intracranial hemorrhage, mass lesion or mass-effect. Vascular: Unremarkable Skull: Intact. Sinuses/Orbits: Paranasal sinuses are well aerated. Mastoid air cells are unremarkable. Orbits are unremarkable. Other: None. IMPRESSION: No acute intracranial process. Electronically Signed   By: Annia Beltrew  Davis M.D.   On: 04/19/2019 13:51   Mr Laqueta JeanBrain W YQWo Contrast  Result Date: 04/19/2019 CLINICAL DATA:  58 year old female with left side weakness, shortness of breath. EXAM: MRI HEAD WITHOUT AND WITH CONTRAST MRI CERVICAL SPINE WITHOUT CONTRAST TECHNIQUE: Multiplanar, multiecho pulse sequences of the brain were obtained without and with intravenous contrast. Multiplanar multisequence noncontrast imaging of the cervical spine. CONTRAST:  9 milliliters Gadavist. COMPARISON:  Cervical spine MRI 03/21/2019. Head CT 1327 hours  today and earlier. FINDINGS: MRI HEAD FINDINGS Brain: There is a linear 10 millimeter area of restricted diffusion in the posterior right corona radiata tracking to the subcortical white matter of the motor strip (series 5, image 40 and series 7, image 17. No associated hemorrhage or mass effect. Underlying Patchy and confluent bilateral cerebral white matter  T2 and FLAIR hyperintensity, especially in the periatrial regions. No other restricted diffusion. No midline shift, mass effect, evidence of mass lesion, ventriculomegaly, extra-axial collection or acute intracranial hemorrhage. Cervicomedullary junction and pituitary are within normal limits. Possible chronic microhemorrhage at the left parieto-occipital sulcus on series 11, image 16. No cortical encephalomalacia identified. Negative deep gray matter nuclei, brainstem and cerebellum. No abnormal enhancement identified.  No dural thickening. Vascular: Major intracranial vascular flow voids are preserved. The major dural venous sinuses appear to be enhancing and patent. Skull and upper cervical spine: Hyperostosis of the calvarium. Normal bone marrow signal at the skull base. Cervical spine described below. Sinuses/Orbits: Negative orbits.  Paranasal sinuses are clear. Other: Mastoids are clear. Grossly normal visible internal auditory structures. Scalp and face soft tissues appear negative. MRI CERVICAL SPINE FINDINGS Alignment: Improved cervical lordosis compared to 03/21/2019. No spondylolisthesis. Vertebrae: No marrow edema or evidence of acute osseous abnormality. Cord: Spinal cord signal is within normal limits at all visualized levels. Posterior Fossa, vertebral arteries, paraspinal tissues: Cervicomedullary junction is within normal limits. Preserved major vascular flow voids in the neck. Negative neck soft tissues and visible lung apices. Disc levels: Stable since 03/21/2019 and notable for: C4-C5: Circumferential disc bulge with endplate spurring and  mild facet hypertrophy. Mild spinal stenosis and spinal cord mass effect. Moderate left and mild right C5 foraminal stenosis. C5-C6: Disc space loss with circumferential disc osteophyte complex eccentric to the left. Borderline to mild spinal stenosis, no cord mass effect. Moderate to severe left and mild-to-moderate right C6 foraminal stenosis. IMPRESSION: 1. 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 No associated hemorrhage or mass effect. 2. Moderate underlying cerebral white matter disease, most commonly due to chronic small vessel ischemia. 3. Stable MRI appearance of the cervical spine since June: - C4-C5 mild spinal stenosis and mild spinal cord mass effect but no cord signal abnormality. Up to moderate left neural foraminal stenosis. - C5-C6 moderate to severe left greater than right neural foraminal stenosis. Electronically Signed   By: Genevie Ann M.D.   On: 04/19/2019 19:31   Mr Cervical Spine Wo Contrast  Result Date: 04/19/2019 CLINICAL DATA:  58 year old female with left side weakness, shortness of breath. EXAM: MRI HEAD WITHOUT AND WITH CONTRAST MRI CERVICAL SPINE WITHOUT CONTRAST TECHNIQUE: Multiplanar, multiecho pulse sequences of the brain were obtained without and with intravenous contrast. Multiplanar multisequence noncontrast imaging of the cervical spine. CONTRAST:  9 milliliters Gadavist. COMPARISON:  Cervical spine MRI 03/21/2019. Head CT 1327 hours today and earlier. FINDINGS: MRI HEAD FINDINGS Brain: There is a linear 10 millimeter area of restricted diffusion in the posterior right corona radiata tracking to the subcortical white matter of the motor strip (series 5, image 40 and series 7, image 17. No associated hemorrhage or mass effect. Underlying Patchy and confluent bilateral cerebral white matter T2 and FLAIR hyperintensity, especially in the periatrial regions. No other restricted diffusion. No midline shift, mass effect,  evidence of mass lesion, ventriculomegaly, extra-axial collection or acute intracranial hemorrhage. Cervicomedullary junction and pituitary are within normal limits. Possible chronic microhemorrhage at the left parieto-occipital sulcus on series 11, image 16. No cortical encephalomalacia identified. Negative deep gray matter nuclei, brainstem and cerebellum. No abnormal enhancement identified.  No dural thickening. Vascular: Major intracranial vascular flow voids are preserved. The major dural venous sinuses appear to be enhancing and patent. Skull and upper cervical spine: Hyperostosis of the calvarium. Normal bone marrow signal at the skull  base. Cervical spine described below. Sinuses/Orbits: Negative orbits.  Paranasal sinuses are clear. Other: Mastoids are clear. Grossly normal visible internal auditory structures. Scalp and face soft tissues appear negative. MRI CERVICAL SPINE FINDINGS Alignment: Improved cervical lordosis compared to 03/21/2019. No spondylolisthesis. Vertebrae: No marrow edema or evidence of acute osseous abnormality. Cord: Spinal cord signal is within normal limits at all visualized levels. Posterior Fossa, vertebral arteries, paraspinal tissues: Cervicomedullary junction is within normal limits. Preserved major vascular flow voids in the neck. Negative neck soft tissues and visible lung apices. Disc levels: Stable since 03/21/2019 and notable for: C4-C5: Circumferential disc bulge with endplate spurring and mild facet hypertrophy. Mild spinal stenosis and spinal cord mass effect. Moderate left and mild right C5 foraminal stenosis. C5-C6: Disc space loss with circumferential disc osteophyte complex eccentric to the left. Borderline to mild spinal stenosis, no cord mass effect. Moderate to severe left and mild-to-moderate right C6 foraminal stenosis. IMPRESSION: 1. 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 No associated hemorrhage  or mass effect. 2. Moderate underlying cerebral white matter disease, most commonly due to chronic small vessel ischemia. 3. Stable MRI appearance of the cervical spine since June: - C4-C5 mild spinal stenosis and mild spinal cord mass effect but no cord signal abnormality. Up to moderate left neural foraminal stenosis. - C5-C6 moderate to severe left greater than right neural foraminal stenosis. Electronically Signed   By: Odessa FlemingH  Hall M.D.   On: 04/19/2019 19:31    Pending Labs Unresulted Labs (From admission, onward)    Start     Ordered   04/20/19 0500  Hemoglobin A1c  Tomorrow morning,   STAT     04/19/19 2335   04/20/19 0500  Lipid panel  Tomorrow morning,   STAT    Comments: Fasting    04/19/19 2335   04/19/19 2327  HIV antibody (Routine Testing)  Once,   STAT     04/19/19 2335   04/19/19 2249  Novel Coronavirus,NAA,(SEND-OUT TO REF LAB - TAT 24-48 hrs); Hosp Order  (Asymptomatic Patients Labs)  Once,   STAT    Question:  Rule Out  Answer:  Yes   04/19/19 2248          Vitals/Pain Today's Vitals   04/19/19 2209 04/19/19 2211 04/19/19 2224 04/19/19 2230  BP:  (!) 145/82 (!) 156/84 (!) 157/102  Pulse:  86 84 83  Resp:  18    Temp:  98.8 F (37.1 C)    TempSrc:  Oral    SpO2:  94% 96% 97%  PainSc: 0-No pain       Isolation Precautions No active isolations  Medications Medications  aspirin EC tablet 81 mg (has no administration in time range)  butalbital-acetaminophen-caffeine (FIORICET) 50-325-40 MG per tablet 1 tablet (has no administration in time range)  HYDROcodone-acetaminophen (NORCO/VICODIN) 5-325 MG per tablet 1 tablet (has no administration in time range)  hydrochlorothiazide (HYDRODIURIL) tablet 25 mg (has no administration in time range)  albuterol (VENTOLIN HFA) 108 (90 Base) MCG/ACT inhaler 2 puff (has no administration in time range)   stroke: mapping our early stages of recovery book (has no administration in time range)  0.9 %  sodium chloride infusion  (has no administration in time range)  acetaminophen (TYLENOL) tablet 650 mg (has no administration in time range)    Or  acetaminophen (TYLENOL) solution 650 mg (has no administration in time range)    Or  acetaminophen (TYLENOL) suppository 650  mg (has no administration in time range)  senna-docusate (Senokot-S) tablet 1 tablet (has no administration in time range)  enoxaparin (LOVENOX) injection 40 mg (has no administration in time range)  ondansetron (ZOFRAN) injection 4 mg (has no administration in time range)  alum & mag hydroxide-simeth (MAALOX/MYLANTA) 200-200-20 MG/5ML suspension 30 mL (has no administration in time range)  traZODone (DESYREL) tablet 25 mg (has no administration in time range)  clopidogrel (PLAVIX) tablet 75 mg (has no administration in time range)  potassium chloride (KLOR-CON) packet 40 mEq (has no administration in time range)  ipratropium-albuterol (DUONEB) 0.5-2.5 (3) MG/3ML nebulizer solution 3 mL (has no administration in time range)  ipratropium-albuterol (DUONEB) 0.5-2.5 (3) MG/3ML nebulizer solution 3 mL (3 mLs Nebulization Given 04/19/19 2342)  acetaminophen (TYLENOL) tablet 1,000 mg (1,000 mg Oral Given 04/19/19 2342)    Mobility walks Low fall risk   Focused Assessments    R Recommendations: See Admitting Provider Note  Report given to: Judeth CornfieldStephanie, RN

## 2019-04-19 NOTE — H&P (Addendum)
[facility] [place] [facility]   PATIENT NAME: Traci Cross    MR#:  [id number]  DATE OF BIRTH:  06-Feb-1961  DATE OF ADMISSION:  04/19/2019  PRIMARY CARE PHYSICIAN: Johnna AcostaScarboro, Adam J, NP   REQUESTING/REFERRING PHYSICIAN: Shaune PollackIsaacs, Cameron, MD  CHIEF COMPLAINT:   Chief Complaint  Patient presents with  . Cerebrovascular Accident    HISTORY OF PRESENT ILLNESS:  Traci Booksllen Mahlum  is a 58 y.o. female with a known history of asthma and COPD, hypertension, obstructive sleep apnea and ongoing tobacco abuse, who presented to the emergency room with acute onset of left-sided numbness that involved her arm and leg with associated mild weakness that started around 9 AM this morning.  No dysphagia or dysarthria.  She admitted to headache without dizziness or blurred vision.  She denies any ataxia, vertigo or tinnitus.  She denies any urinary or stool incontinence.  She denies any chest pain or palpitations.  She initially came to the ER around 1 PM.  She had a tele-neurology consultation without not to be a candidate for TPA.  Head CT scan revealed no acute intracranial normalities.  She had a brain MRI that showed right posterior MCA territory small infarction.  She had to leave to take care of her dog and later came back within an hour to the ER.   Here in the ER her blood pressures been elevated with otherwise normal vital signs.  Labs revealed mild hypokalemia of 3.3 and labs otherwise were unremarkable.  Her COVID-19 rapid test came back negative.  EKG showed normal sinus rhythm with rate of 86 with suspected left atrial enlargement and poor R wave progression.  The patient took aspirin at home.  She was given duo nebs and 1 g of p.o. Tylenol here.  She will be admitted to a telemetry bed for further evaluation and management. PAST MEDICAL HISTORY:   Past Medical History:  Diagnosis Date  . Asthma   . COPD (chronic obstructive pulmonary disease) (HCC)   . Hypertension   .  Sleep apnea   . Tobacco abuse     PAST SURGICAL HISTORY:   Past Surgical History:  Procedure Laterality Date  . CHOLECYSTECTOMY    . TONSILLECTOMY      SOCIAL HISTORY:   Social History   Tobacco Use  . Smoking status: Current Every Day Smoker    Packs/day: 0.25    Years: 35.00    Pack years: 8.75    Types: Cigarettes  . Smokeless tobacco: Never Used  Substance Use Topics  . Alcohol use: No    Alcohol/week: 0.0 standard drinks    FAMILY HISTORY:   Family History  Problem Relation Age of Onset  . Heart failure Mother   . Stroke Mother   . Heart failure Father   . Stroke Father     DRUG ALLERGIES:   Allergies  Allergen Reactions  . Levaquin [Levofloxacin] Hives    Dr notified.   . Codeine   . Penicillins     REVIEW OF SYSTEMS:   ROS As per history of present illness. All pertinent systems were reviewed above. Constitutional,  HEENT, cardiovascular, respiratory, GI, GU, musculoskeletal, neuro, psychiatric, endocrine,  integumentary and hematologic systems were reviewed and are otherwise  negative/unremarkable except for positive findings mentioned above in the HPI.   MEDICATIONS AT HOME:   Prior to Admission medications   Medication Sig Start Date End Date Taking? Authorizing Provider  albuterol (VENTOLIN HFA) 108 (90 Base) MCG/ACT inhaler  Inhale 2 puffs into the lungs every 6 (six) hours as needed for wheezing or shortness of breath. 03/27/19   Johnna AcostaScarboro, Adam J, NP  aspirin EC 81 MG tablet Take 1 tablet (81 mg total) by mouth daily. 04/19/19 04/18/20  Shaune PollackIsaacs, Cameron, MD  budesonide-formoterol (SYMBICORT) 160-4.5 MCG/ACT inhaler Inhale 2 puffs into the lungs 2 (two) times daily. 07/02/18   Johnna AcostaScarboro, Adam J, NP  butalbital-acetaminophen-caffeine (FIORICET, ESGIC) [phone] MG tablet Take 1 tablet by mouth every 6 (six) hours as needed for headache. 11/06/18 11/06/19  Darci CurrentBrown, Busby N, MD  hydrochlorothiazide (HYDRODIURIL) 25 MG tablet Take 1 tablet (25 mg total)  by mouth daily. 07/02/18   Johnna AcostaScarboro, Adam J, NP  HYDROcodone-acetaminophen (NORCO/VICODIN) 5-325 MG tablet Take 1 tablet by mouth every 6 (six) hours as needed for moderate pain. 11/01/18   Tommi RumpsSummers, Rhonda L, PA-C      VITAL SIGNS:  Blood pressure (!) 157/102, pulse 83, temperature 98.8 F (37.1 C), temperature source Oral, resp. rate 18, SpO2 97 %.  PHYSICAL EXAMINATION:  Physical Exam  GENERAL:  58 y.o.-year-old Caucasian female patient lying in the bed in no acute distress.   EYES: Pupils equal, round, reactive to light and accommodation. No scleral icterus. Extraocular muscles intact.  HEENT: Head atraumatic, normocephalic. Oropharynx and nasopharynx clear.  NECK:  Supple, no jugular venous distention. No thyroid enlargement, no tenderness.  LUNGS: Clear to auscultation bilaterally. CARDIOVASCULAR: Regular rate and rhythm, S1, S2 normal. No murmurs, rubs, or gallops.  ABDOMEN: Soft, nondistended, nontender. Bowel sounds present. No organomegaly or mass.  EXTREMITIES: No pitting edema, cyanosis, or clubbing.  NEUROLOGIC: Cranial nerves II through XII are intact. Muscle strength 5/5 in all extremities.  Normal sensory exam to light touch throughout.  Normal finger to finger and finger-to-nose as well as heel-to-shin tests.  No dysmetria or dysdiadochokinesia.  Gait was not tested. PSYCHIATRIC: The patient is alert and oriented x 3.  Normal affect and good eye contact. SKIN: No obvious rash, lesion, or ulcer.   LABORATORY PANEL:   CBC Recent Labs  Lab 04/19/19 1309  WBC 8.0  HGB 14.4  HCT 42.7  PLT 195   ------------------------------------------------------------------------------------------------------------------  Chemistries  Recent Labs  Lab 04/19/19 1309  NA 141  K 3.3*  CL 108  CO2 24  GLUCOSE 99  BUN 14  CREATININE 0.76  CALCIUM 8.3*  AST 19  ALT 15  ALKPHOS 71  BILITOT 0.4    ------------------------------------------------------------------------------------------------------------------  Cardiac Enzymes No results for input(s): TROPONINI in the last 168 hours. ------------------------------------------------------------------------------------------------------------------  RADIOLOGY:  Ct Head Wo Contrast  Result Date: 04/19/2019 CLINICAL DATA:  Patient with shortness of breath. Left-sided weakness. EXAM: CT HEAD WITHOUT CONTRAST TECHNIQUE: Contiguous axial images were obtained from the base of the skull through the vertex without intravenous contrast. COMPARISON:  None. FINDINGS: Brain: Ventricles and sulci are appropriate for patient's age. No evidence for acute cortically based infarct, intracranial hemorrhage, mass lesion or mass-effect. Vascular: Unremarkable Skull: Intact. Sinuses/Orbits: Paranasal sinuses are well aerated. Mastoid air cells are unremarkable. Orbits are unremarkable. Other: None. IMPRESSION: No acute intracranial process. Electronically Signed   By: Annia Beltrew  Davis M.D.   On: 04/19/2019 13:51   Mr Laqueta JeanBrain W JYWo Contrast  Result Date: 04/19/2019 CLINICAL DATA:  [age] year old female with left side weakness, shortness of breath. EXAM: MRI HEAD WITHOUT AND WITH CONTRAST MRI CERVICAL SPINE WITHOUT CONTRAST TECHNIQUE: Multiplanar, multiecho pulse sequences of the brain were obtained without and with intravenous contrast. Multiplanar multisequence noncontrast imaging of the cervical spine.  CONTRAST:  9 milliliters Gadavist. COMPARISON:  Cervical spine MRI 03/21/2019. Head CT 1327 hours today and earlier. FINDINGS: MRI HEAD FINDINGS Brain: There is a linear 10 millimeter area of restricted diffusion in the posterior right corona radiata tracking to the subcortical white matter of the motor strip (series 5, image 40 and series 7, image 17. No associated hemorrhage or mass effect. Underlying Patchy and confluent bilateral cerebral white matter T2 and FLAIR  hyperintensity, especially in the periatrial regions. No other restricted diffusion. No midline shift, mass effect, evidence of mass lesion, ventriculomegaly, extra-axial collection or acute intracranial hemorrhage. Cervicomedullary junction and pituitary are within normal limits. Possible chronic microhemorrhage at the left parieto-occipital sulcus on series 11, image 16. No cortical encephalomalacia identified. Negative deep gray matter nuclei, brainstem and cerebellum. No abnormal enhancement identified.  No dural thickening. Vascular: Major intracranial vascular flow voids are preserved. The major dural venous sinuses appear to be enhancing and patent. Skull and upper cervical spine: Hyperostosis of the calvarium. Normal bone marrow signal at the skull base. Cervical spine described below. Sinuses/Orbits: Negative orbits.  Paranasal sinuses are clear. Other: Mastoids are clear. Grossly normal visible internal auditory structures. Scalp and face soft tissues appear negative. MRI CERVICAL SPINE FINDINGS Alignment: Improved cervical lordosis compared to 03/21/2019. No spondylolisthesis. Vertebrae: No marrow edema or evidence of acute osseous abnormality. Cord: Spinal cord signal is within normal limits at all visualized levels. Posterior Fossa, vertebral arteries, paraspinal tissues: Cervicomedullary junction is within normal limits. Preserved major vascular flow voids in the neck. Negative neck soft tissues and visible lung apices. Disc levels: Stable since 03/21/2019 and notable for: C4-C5: Circumferential disc bulge with endplate spurring and mild facet hypertrophy. Mild spinal stenosis and spinal cord mass effect. Moderate left and mild right C5 foraminal stenosis. C5-C6: Disc space loss with circumferential disc osteophyte complex eccentric to the left. Borderline to mild spinal stenosis, no cord mass effect. Moderate to severe left and mild-to-moderate right C6 foraminal stenosis. IMPRESSION: 1. Small acute  white matter infarct in the posterior right MCA territory tracking toward the motor strip. No associated hemorrhage or mass effect. 2. Moderate underlying cerebral white matter disease, most commonly due to chronic small vessel ischemia. 3. Stable MRI appearance of the cervical spine since June: - C4-C5 mild spinal stenosis and mild spinal cord mass effect but no cord signal abnormality. Up to moderate left neural foraminal stenosis. - C5-C6 moderate to severe left greater than right neural foraminal stenosis. Electronically Signed   By: Odessa Fleming M.D.   On: 04/19/2019 19:31   Mr Cervical Spine Wo Contrast  Result Date: 04/19/2019 CLINICAL DATA:  58 year old female with left side weakness, shortness of breath. EXAM: MRI HEAD WITHOUT AND WITH CONTRAST MRI CERVICAL SPINE WITHOUT CONTRAST TECHNIQUE: Multiplanar, multiecho pulse sequences of the brain were obtained without and with intravenous contrast. Multiplanar multisequence noncontrast imaging of the cervical spine. CONTRAST:  9 milliliters Gadavist. COMPARISON:  Cervical spine MRI 03/21/2019. Head CT 1327 hours today and earlier. FINDINGS: MRI HEAD FINDINGS Brain: There is a linear 10 millimeter area of restricted diffusion in the posterior right corona radiata tracking to the subcortical white matter of the motor strip (series 5, image 40 and series 7, image 17. No associated hemorrhage or mass effect. Underlying Patchy and confluent bilateral cerebral white matter T2 and FLAIR hyperintensity, especially in the periatrial regions. No other restricted diffusion. No midline shift, mass effect, evidence of mass lesion, ventriculomegaly, extra-axial collection or acute intracranial hemorrhage. Cervicomedullary junction and pituitary  are within normal limits. Possible chronic microhemorrhage at the left parieto-occipital sulcus on series 11, image 16. No cortical encephalomalacia identified. Negative deep gray matter nuclei, brainstem and cerebellum. No abnormal  enhancement identified.  No dural thickening. Vascular: Major intracranial vascular flow voids are preserved. The major dural venous sinuses appear to be enhancing and patent. Skull and upper cervical spine: Hyperostosis of the calvarium. Normal bone marrow signal at the skull base. Cervical spine described below. Sinuses/Orbits: Negative orbits.  Paranasal sinuses are clear. Other: Mastoids are clear. Grossly normal visible internal auditory structures. Scalp and face soft tissues appear negative. MRI CERVICAL SPINE FINDINGS Alignment: Improved cervical lordosis compared to 03/21/2019. No spondylolisthesis. Vertebrae: No marrow edema or evidence of acute osseous abnormality. Cord: Spinal cord signal is within normal limits at all visualized levels. Posterior Fossa, vertebral arteries, paraspinal tissues: Cervicomedullary junction is within normal limits. Preserved major vascular flow voids in the neck. Negative neck soft tissues and visible lung apices. Disc levels: Stable since 03/21/2019 and notable for: C4-C5: Circumferential disc bulge with endplate spurring and mild facet hypertrophy. Mild spinal stenosis and spinal cord mass effect. Moderate left and mild right C5 foraminal stenosis. C5-C6: Disc space loss with circumferential disc osteophyte complex eccentric to the left. Borderline to mild spinal stenosis, no cord mass effect. Moderate to severe left and mild-to-moderate right C6 foraminal stenosis. IMPRESSION: 1. 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 No associated hemorrhage or mass effect. 2. Moderate underlying cerebral white matter disease, most commonly due to chronic small vessel ischemia. 3. Stable MRI appearance of the cervical spine since June: - C4-C5 mild spinal stenosis and mild spinal cord mass effect but no cord signal abnormality. Up to moderate left neural foraminal stenosis. - C5-C6 moderate to severe left greater than right  neural foraminal stenosis. Electronically Signed   By: Genevie Ann M.D.   On: 04/19/2019 19:31      IMPRESSION AND PLAN:   1.  Acute right posterior MCA territory infarction with subsequent left-sided numbness and weakness.  The patient will be admitted to a medically monitored bed.  We will follow neuro checks every 4 hours for 24 hours.  Will obtain bilateral carotid Doppler and 2D echo in a.m.  We will place the patient on aspirin and Plavix.  Will check fasting lipids and place her on statin therapy.  2.  COPD/asthma.  The patient will be placed on PRN duo nebs and will hold off her Symbicort.  3.  Ongoing tobacco abuse.  She was counseled for smoking cessation and will receive further counseling here.  For current craving she will be placed on NicoDerm CQ patch.  4.  Chronic respiratory failure on home O2 at night.  She will be placed on 2 L of O2 by nasal cannula nightly.  5.  Hypertension.  Permissive hypertension will be allowed.  6.  DVT prophylaxis.  Subcutaneous Lovenox.  All the records are reviewed and case discussed with ED provider. The plan of care was discussed in details with the patient (and family). I answered all questions. The patient agreed to proceed with the above mentioned plan. Further management will depend upon hospital course.   CODE STATUS: Full code  TOTAL TIME TAKING CARE OF THIS PATIENT: 50 minutes.    Christel Mormon M.D on 04/19/2019 at 11:40 PM  Pager - [phone]  After 6pm go to [email or web address] - [facility]  Office  [phone]  CC: Primary care physician; Johnna AcostaScarboro, Adam J, NP   Note: This dictation was prepared with Dragon dictation along with smaller phrase technology. Any transcriptional errors that result from this process are unintentional.

## 2019-04-19 NOTE — ED Notes (Signed)
Pt told by MD she has had a stroke and that she needs to be admitted. Pt says she needs to leave to take care of her animals and she wants a food tray and she will come back. Pt not making eye contact. Pt signed out ama.

## 2019-04-20 ENCOUNTER — Inpatient Hospital Stay: Payer: Medicaid Other

## 2019-04-20 DIAGNOSIS — I639 Cerebral infarction, unspecified: Secondary | ICD-10-CM

## 2019-04-20 LAB — LIPID PANEL
Cholesterol: 151 mg/dL (ref 0–200)
HDL: 33 mg/dL — ABNORMAL LOW (ref >40)
LDL Cholesterol: 65 mg/dL (ref 0–99)
Total CHOL/HDL Ratio: 4.6 RATIO
Triglycerides: 265 mg/dL — ABNORMAL HIGH (ref <150)
VLDL: 53 mg/dL — ABNORMAL HIGH (ref 0–40)

## 2019-04-20 LAB — BASIC METABOLIC PANEL
Anion gap: 7 (ref 5–15)
BUN: 16 mg/dL (ref 6–20)
CO2: 25 mmol/L (ref 22–32)
Calcium: 8.1 mg/dL — ABNORMAL LOW (ref 8.9–10.3)
Chloride: 108 mmol/L (ref 98–111)
Creatinine, Ser: 0.7 mg/dL (ref 0.44–1.00)
GFR calc Af Amer: >60 mL/min (ref >60)
GFR calc non Af Amer: >60 mL/min (ref >60)
Glucose, Bld: 103 mg/dL — ABNORMAL HIGH (ref 70–99)
Potassium: 3.3 mmol/L — ABNORMAL LOW (ref 3.5–5.1)
Sodium: 140 mmol/L (ref 135–145)

## 2019-04-20 LAB — HEMOGLOBIN A1C
Hgb A1c MFr Bld: 5.4 % (ref 4.8–5.6)
Mean Plasma Glucose: 108.28 mg/dL

## 2019-04-20 LAB — MAGNESIUM: Magnesium: 2.3 mg/dL (ref 1.7–2.4)

## 2019-04-20 MED ORDER — NICOTINE 21 MG/24HR TD PT24
21.0000 mg | MEDICATED_PATCH | Freq: Every day | TRANSDERMAL | Status: DC
  Administered 2019-04-20: 10:00:00 21 mg via TRANSDERMAL
  Filled 2019-04-20 (×2): qty 1

## 2019-04-20 MED ORDER — NICOTINE 21 MG/24HR TD PT24
21.0000 mg | MEDICATED_PATCH | Freq: Every day | TRANSDERMAL | Status: DC
  Administered 2019-04-20 (×2): 21 mg via TRANSDERMAL
  Filled 2019-04-20: qty 1

## 2019-04-20 MED ORDER — ATORVASTATIN CALCIUM 40 MG PO TABS: 40.0000 mg | ORAL_TABLET | Freq: Every day | ORAL | 1 refills | Status: DC

## 2019-04-20 MED ORDER — NICOTINE 21 MG/24HR TD PT24: 21.0000 mg | MEDICATED_PATCH | Freq: Every day | TRANSDERMAL | 1 refills | Status: DC

## 2019-04-20 MED ORDER — CLOPIDOGREL BISULFATE 75 MG PO TABS: 75.0000 mg | ORAL_TABLET | Freq: Every day | ORAL | 0 refills | Status: AC

## 2019-04-20 MED ORDER — SIMVASTATIN 20 MG PO TABS: 20.0000 mg | ORAL_TABLET | Freq: Every day | ORAL | Status: DC

## 2019-04-20 MED ORDER — POTASSIUM CHLORIDE CRYS ER 20 MEQ PO TBCR
40.0000 meq | EXTENDED_RELEASE_TABLET | Freq: Once | ORAL | Status: AC
  Administered 2019-04-20: 16:00:00 40 meq via ORAL
  Filled 2019-04-20: qty 2

## 2019-04-20 MED ORDER — ATORVASTATIN CALCIUM 20 MG PO TABS
40.0000 mg | ORAL_TABLET | Freq: Every day | ORAL | Status: DC
  Administered 2019-04-20: 40 mg via ORAL
  Filled 2019-04-20: qty 2

## 2019-04-20 NOTE — Plan of Care (Signed)
  Problem: Education: Goal: Knowledge of disease or condition will improve Outcome: Progressing Goal: Knowledge of secondary prevention will improve Outcome: Progressing Goal: Knowledge of patient specific risk factors addressed and post discharge goals established will improve Outcome: Progressing   Problem: Coping: Goal: Will verbalize positive feelings about self Outcome: Progressing Goal: Will identify appropriate support needs Outcome: Progressing   Problem: Health Behavior/Discharge Planning: Goal: Ability to manage health-related needs will improve Outcome: Progressing   Problem: Self-Care: Goal: Ability to participate in self-care as condition permits will improve Outcome: Progressing Goal: Verbalization of feelings and concerns over difficulty with self-care will improve Outcome: Progressing Goal: Ability to communicate needs accurately will improve Outcome: Progressing   Problem: Nutrition: Goal: Risk of aspiration will decrease Outcome: Progressing   

## 2019-04-20 NOTE — Discharge Summary (Addendum)
[facility] [place] [facility]   PATIENT NAME: Randel Booksllen Hargreaves    MR#:  [id number]  DATE OF BIRTH:  10-31-1960  DATE OF ADMISSION:  04/19/2019   ADMITTING PHYSICIAN: Hannah BeatJan A Mansy, MD  DATE OF DISCHARGE: 04/21/2019 PRIMARY CARE PHYSICIAN: Johnna AcostaScarboro, Adam J, NP   ADMISSION DIAGNOSIS:  CVA (cerebral vascular accident) [facility](HCC) [I63.9] Cerebrovascular accident (CVA), unspecified mechanism (HCC) [I63.9] DISCHARGE DIAGNOSIS:  Active Problems:   Acute CVA (cerebrovascular accident) (HCC)  SECONDARY DIAGNOSIS:   Past Medical History:  Diagnosis Date  . Asthma   . COPD (chronic obstructive pulmonary disease) (HCC)   . Hypertension   . Sleep apnea   . Tobacco abuse    HOSPITAL COURSE:  1.  Acute right posterior MCA territory infarction with subsequent left-sided numbness and weakness.   Per Dr. Thad Rangereynolds, prophylactic therapy-Dual antiplatelet therapy with ASA 81mg  and Plavix 75mg  for three weeks with change to ASA 81mg  alone as monotherapy after that time. Carotid duplex is unremarkable.  Echo is unremarkable.  2.  COPD/asthma.  The patient will be placed on PRN duo nebs and will hold off her Symbicort.  3.  Ongoing tobacco abuse.   Smoking cessation was counseled for 3 to 4 minutes, on NicoDerm CQ patch.  4.  Chronic respiratory failure on home O2 at night.  In room air without hypoxia. 5.  Hypertension.   Continue home hypertension medication.  Hypokalemia.  Potassium supplement.  Follow-up level as outpatient. PT evaluation suggest home OT. I discussed with Dr. Thad Rangereynolds and Dr. Kirke CorinArida. DISCHARGE CONDITIONS:  Stable, discharge to home with home OT. CONSULTS OBTAINED:  Treatment Team:  Kym Groomriadhosp, Neuro1, MD DRUG ALLERGIES:   Allergies  Allergen Reactions  . Levaquin [Levofloxacin] Hives    Dr notified.   . Codeine Hives  . Penicillins Hives   DISCHARGE MEDICATIONS:   Allergies as of 04/21/2019      Reactions   Levaquin [levofloxacin] Hives   Dr  notified.    Codeine Hives   Penicillins Hives      Medication List    TAKE these medications   albuterol 108 (90 Base) MCG/ACT inhaler Commonly known as: Ventolin HFA Inhale 2 puffs into the lungs every 6 (six) hours as needed for wheezing or shortness of breath.   aspirin EC 81 MG tablet Take 1 tablet (81 mg total) by mouth daily.   atorvastatin 40 MG tablet Commonly known as: LIPITOR Take 1 tablet (40 mg total) by mouth daily at 6 PM.   budesonide-formoterol 160-4.5 MCG/ACT inhaler Commonly known as: Symbicort Inhale 2 puffs into the lungs 2 (two) times daily.   butalbital-acetaminophen-caffeine 50-325-40 MG tablet Commonly known as: FIORICET Take 1 tablet by mouth every 6 (six) hours as needed for headache.   clopidogrel 75 MG tablet Commonly known as: PLAVIX Take 1 tablet (75 mg total) by mouth daily for 21 days.   hydrochlorothiazide 25 MG tablet Commonly known as: HYDRODIURIL Take 1 tablet (25 mg total) by mouth daily.   HYDROcodone-acetaminophen 5-325 MG tablet Commonly known as: NORCO/VICODIN Take 1 tablet by mouth every 6 (six) hours as needed for moderate pain.   nicotine 21 mg/24hr patch Commonly known as: NICODERM CQ - dosed in mg/24 hours Place 1 patch (21 mg total) onto the skin daily.        DISCHARGE INSTRUCTIONS:  See AVS.  If you experience worsening of your admission symptoms, develop shortness of breath, life threatening emergency, suicidal or homicidal thoughts you must seek medical  attention immediately by calling 911 or calling your MD immediately  if symptoms less severe.  You Must read complete instructions/literature along with all the possible adverse reactions/side effects for all the Medicines you take and that have been prescribed to you. Take any new Medicines after you have completely understood and accpet all the possible adverse reactions/side effects.   Please note  You were cared for by a hospitalist during your hospital  stay. If you have any questions about your discharge medications or the care you received while you were in the hospital after you are discharged, you can call the unit and asked to speak with the hospitalist on call if the hospitalist that took care of you is not available. Once you are discharged, your primary care physician will handle any further medical issues. Please note that NO REFILLS for any discharge medications will be authorized once you are discharged, as it is imperative that you return to your primary care physician (or establish a relationship with a primary care physician if you do not have one) for your aftercare needs so that they can reassess your need for medications and monitor your lab values.    On the day of Discharge:  VITAL SIGNS:  Blood pressure (!) 156/94, pulse 63, temperature 97.6 F (36.4 C), temperature source Oral, resp. rate 20, height 5' 7.5" (1.715 m), weight 94.2 kg, SpO2 95 %. PHYSICAL EXAMINATION:  GENERAL:  58 y.o.-year-old patient lying in the bed with no acute distress.  EYES: Pupils equal, round, reactive to light and accommodation. No scleral icterus. Extraocular muscles intact.  HEENT: Head atraumatic, normocephalic. Oropharynx and nasopharynx clear.  NECK:  Supple, no jugular venous distention. No thyroid enlargement, no tenderness.  LUNGS: Normal breath sounds bilaterally, no wheezing, rales,rhonchi or crepitation. No use of accessory muscles of respiration.  CARDIOVASCULAR: S1, S2 normal. No murmurs, rubs, or gallops.  ABDOMEN: Soft, non-tender, non-distended. Bowel sounds present. No organomegaly or mass.  EXTREMITIES: No pedal edema, cyanosis, or clubbing.  NEUROLOGIC: Cranial nerves II through XII are intact. Muscle strength 5/5 in all extremities. Sensation intact. Gait not checked.  PSYCHIATRIC: The patient is alert and oriented x 3.  SKIN: No obvious rash, lesion, or ulcer.  DATA REVIEW:   CBC Recent Labs  Lab 04/19/19 1309  WBC 8.0   HGB 14.4  HCT 42.7  PLT 195    Chemistries  Recent Labs  Lab 04/19/19 1309 04/20/19 0530  NA 141 140  K 3.3* 3.3*  CL 108 108  CO2 24 25  GLUCOSE 99 103*  BUN 14 16  CREATININE 0.76 0.70  CALCIUM 8.3* 8.1*  MG  --  2.3  AST 19  --   ALT 15  --   ALKPHOS 71  --   BILITOT 0.4  --      Microbiology Results  No results found for this or any previous visit.  RADIOLOGY:  Ct Head Wo Contrast  Result Date: 04/19/2019 CLINICAL DATA:  Patient with shortness of breath. Left-sided weakness. EXAM: CT HEAD WITHOUT CONTRAST TECHNIQUE: Contiguous axial images were obtained from the base of the skull through the vertex without intravenous contrast. COMPARISON:  None. FINDINGS: Brain: Ventricles and sulci are appropriate for patient's age. No evidence for acute cortically based infarct, intracranial hemorrhage, mass lesion or mass-effect. Vascular: Unremarkable Skull: Intact. Sinuses/Orbits: Paranasal sinuses are well aerated. Mastoid air cells are unremarkable. Orbits are unremarkable. Other: None. IMPRESSION: No acute intracranial process. Electronically Signed   By: Francis Gainesrew  Davis M.D.  On: 04/19/2019 13:51   Mr Laqueta Jean BJ Contrast  Result Date: 04/19/2019 CLINICAL DATA:  58 year old female with left side weakness, shortness of breath. EXAM: MRI HEAD WITHOUT AND WITH CONTRAST MRI CERVICAL SPINE WITHOUT CONTRAST TECHNIQUE: Multiplanar, multiecho pulse sequences of the brain were obtained without and with intravenous contrast. Multiplanar multisequence noncontrast imaging of the cervical spine. CONTRAST:  9 milliliters Gadavist. COMPARISON:  Cervical spine MRI 03/21/2019. Head CT 1327 hours today and earlier. FINDINGS: MRI HEAD FINDINGS Brain: There is a linear 10 millimeter area of restricted diffusion in the posterior right corona radiata tracking to the subcortical white matter of the motor strip (series 5, image 40 and series 7, image 17. No associated hemorrhage or mass effect. Underlying  Patchy and confluent bilateral cerebral white matter T2 and FLAIR hyperintensity, especially in the periatrial regions. No other restricted diffusion. No midline shift, mass effect, evidence of mass lesion, ventriculomegaly, extra-axial collection or acute intracranial hemorrhage. Cervicomedullary junction and pituitary are within normal limits. Possible chronic microhemorrhage at the left parieto-occipital sulcus on series 11, image 16. No cortical encephalomalacia identified. Negative deep gray matter nuclei, brainstem and cerebellum. No abnormal enhancement identified.  No dural thickening. Vascular: Major intracranial vascular flow voids are preserved. The major dural venous sinuses appear to be enhancing and patent. Skull and upper cervical spine: Hyperostosis of the calvarium. Normal bone marrow signal at the skull base. Cervical spine described below. Sinuses/Orbits: Negative orbits.  Paranasal sinuses are clear. Other: Mastoids are clear. Grossly normal visible internal auditory structures. Scalp and face soft tissues appear negative. MRI CERVICAL SPINE FINDINGS Alignment: Improved cervical lordosis compared to 03/21/2019. No spondylolisthesis. Vertebrae: No marrow edema or evidence of acute osseous abnormality. Cord: Spinal cord signal is within normal limits at all visualized levels. Posterior Fossa, vertebral arteries, paraspinal tissues: Cervicomedullary junction is within normal limits. Preserved major vascular flow voids in the neck. Negative neck soft tissues and visible lung apices. Disc levels: Stable since 03/21/2019 and notable for: C4-C5: Circumferential disc bulge with endplate spurring and mild facet hypertrophy. Mild spinal stenosis and spinal cord mass effect. Moderate left and mild right C5 foraminal stenosis. C5-C6: Disc space loss with circumferential disc osteophyte complex eccentric to the left. Borderline to mild spinal stenosis, no cord mass effect. Moderate to severe left and  mild-to-moderate right C6 foraminal stenosis. IMPRESSION: 1. 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 No associated hemorrhage or mass effect. 2. Moderate underlying cerebral white matter disease, most commonly due to chronic small vessel ischemia. 3. Stable MRI appearance of the cervical spine since June: - C4-C5 mild spinal stenosis and mild spinal cord mass effect but no cord signal abnormality. Up to moderate left neural foraminal stenosis. - C5-C6 moderate to severe left greater than right neural foraminal stenosis. Electronically Signed   By: Odessa Fleming M.D.   On: 04/19/2019 19:31   Mr Cervical Spine Wo Contrast  Result Date: 04/19/2019 CLINICAL DATA:  58 year old female with left side weakness, shortness of breath. EXAM: MRI HEAD WITHOUT AND WITH CONTRAST MRI CERVICAL SPINE WITHOUT CONTRAST TECHNIQUE: Multiplanar, multiecho pulse sequences of the brain were obtained without and with intravenous contrast. Multiplanar multisequence noncontrast imaging of the cervical spine. CONTRAST:  9 milliliters Gadavist. COMPARISON:  Cervical spine MRI 03/21/2019. Head CT 1327 hours today and earlier. FINDINGS: MRI HEAD FINDINGS Brain: There is a linear 10 millimeter area of restricted diffusion in the posterior right corona radiata tracking to the subcortical white  matter of the motor strip (series 5, image 40 and series 7, image 17. No associated hemorrhage or mass effect. Underlying Patchy and confluent bilateral cerebral white matter T2 and FLAIR hyperintensity, especially in the periatrial regions. No other restricted diffusion. No midline shift, mass effect, evidence of mass lesion, ventriculomegaly, extra-axial collection or acute intracranial hemorrhage. Cervicomedullary junction and pituitary are within normal limits. Possible chronic microhemorrhage at the left parieto-occipital sulcus on series 11, image 16. No cortical encephalomalacia identified.  Negative deep gray matter nuclei, brainstem and cerebellum. No abnormal enhancement identified.  No dural thickening. Vascular: Major intracranial vascular flow voids are preserved. The major dural venous sinuses appear to be enhancing and patent. Skull and upper cervical spine: Hyperostosis of the calvarium. Normal bone marrow signal at the skull base. Cervical spine described below. Sinuses/Orbits: Negative orbits.  Paranasal sinuses are clear. Other: Mastoids are clear. Grossly normal visible internal auditory structures. Scalp and face soft tissues appear negative. MRI CERVICAL SPINE FINDINGS Alignment: Improved cervical lordosis compared to 03/21/2019. No spondylolisthesis. Vertebrae: No marrow edema or evidence of acute osseous abnormality. Cord: Spinal cord signal is within normal limits at all visualized levels. Posterior Fossa, vertebral arteries, paraspinal tissues: Cervicomedullary junction is within normal limits. Preserved major vascular flow voids in the neck. Negative neck soft tissues and visible lung apices. Disc levels: Stable since 03/21/2019 and notable for: C4-C5: Circumferential disc bulge with endplate spurring and mild facet hypertrophy. Mild spinal stenosis and spinal cord mass effect. Moderate left and mild right C5 foraminal stenosis. C5-C6: Disc space loss with circumferential disc osteophyte complex eccentric to the left. Borderline to mild spinal stenosis, no cord mass effect. Moderate to severe left and mild-to-moderate right C6 foraminal stenosis. IMPRESSION: 1. 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 No associated hemorrhage or mass effect. 2. Moderate underlying cerebral white matter disease, most commonly due to chronic small vessel ischemia. 3. Stable MRI appearance of the cervical spine since June: - C4-C5 mild spinal stenosis and mild spinal cord mass effect but no cord signal abnormality. Up to moderate left neural  foraminal stenosis. - C5-C6 moderate to severe left greater than right neural foraminal stenosis. Electronically Signed   By: Odessa FlemingH  Hall M.D.   On: 04/19/2019 19:31   [place] Carotid Bilateral (at [facility] Only)  Result Date: 04/20/2019 CLINICAL DATA:  Hypertension, stroke, tobacco use EXAM: BILATERAL CAROTID DUPLEX ULTRASOUND TECHNIQUE: Wallace CullensGray scale imaging, color Doppler and duplex ultrasound were performed of bilateral carotid and vertebral arteries in the neck. COMPARISON:  None. FINDINGS: Criteria: Quantification of carotid stenosis is based on velocity parameters that correlate the residual internal carotid diameter with NASCET-based stenosis levels, using the diameter of the distal internal carotid lumen as the denominator for stenosis measurement. The following velocity measurements were obtained: RIGHT ICA: 45/16 cm/sec CCA: 55/21 cm/sec SYSTOLIC ICA/CCA RATIO:  0.8 ECA: 93 cm/sec LEFT ICA: 85/33 cm/sec CCA: 69/14 cm/sec SYSTOLIC ICA/CCA RATIO:  1.2 ECA: 50 cm/sec RIGHT CAROTID ARTERY: Minor echogenic shadowing plaque formation. No hemodynamically significant right ICA stenosis, velocity elevation, or turbulent flow. Degree of narrowing less than 50%. RIGHT VERTEBRAL ARTERY:  Antegrade LEFT CAROTID ARTERY: Similar scattered minor echogenic plaque formation. No hemodynamically significant left ICA stenosis, velocity elevation, or turbulent flow. LEFT VERTEBRAL ARTERY:  Antegrade IMPRESSION: Minor carotid atherosclerosis. No hemodynamically significant ICA stenosis. Degree of narrowing less than 50% bilaterally by ultrasound criteria. Patent antegrade vertebral flow bilaterally Electronically Signed   By: Judie PetitM.  Shick M.D.   On: 04/20/2019 09:36     Management plans discussed with the patient, family and they are in agreement.  CODE STATUS: Full Code   TOTAL TIME TAKING CARE OF THIS PATIENT:33 minutes.    Demetrios Loll M.D on 04/21/2019 at 1:52 PM  Between 7am to 6pm - Pager - [phone]  After 6pm go to  [email or web address] - Proofreader  [facility]  Office  [phone]  CC: Primary care physician; Kendell Bane, NP   Note: This dictation was prepared with Dragon dictation along with smaller phrase technology. Any transcriptional errors that result from this process are unintentional.

## 2019-04-20 NOTE — Evaluation (Signed)
Physical Therapy Evaluation Patient Details Name: Traci Cross MRN: [id number] DOB: [birth year] Today's Date: 04/20/2019   History of Present Illness  58 y.o. female with a known history of asthma and COPD, hypertension, obstructive sleep apnea and ongoing tobacco abuse, who presented to the emergency room with acute onset of left-sided numbness that involved her arm and leg with associated mild weakness that started 04/19/2019. MRI+ for acute infarct posterior R MCA.  Clinical Impression  Pt did well with PT exam and was able to ambulate ~200 ft and negotiate up/down steps w/o assist.  Discussed concerns or functional limitations now vs before symptoms started and pt reports she feels very close to her baseline and in discussing follow up with PT at home she reports not being interested or feeling as though functionality would be greatly effected.  Despite MRI being positive for new infarct she showed little functional limitations, asymmetry, or cognitive issues and appears safe to go home.  She states she does have family that can assist so that she does not feel the need to "over-do-it" when she first goes home.     Follow Up Recommendations No PT follow up    Equipment Recommendations  None recommended by PT    Recommendations for Other Services       Precautions / Restrictions Precautions Precautions: Fall Restrictions Weight Bearing Restrictions: No      Mobility  Bed Mobility Overal bed mobility: Independent Bed Mobility: Supine to Sit;Sit to Supine     Supine to sit: Modified independent (Device/Increase time) Sit to supine: Modified independent (Device/Increase time)   General bed mobility comments: Pt easily and independently gets to sitting EOB w/o assist  Transfers Overall transfer level: Modified independent Equipment used: None Transfers: Sit to/from Stand Sit to Stand: Supervision         General transfer comment: Pt was able to rise w/o hesitation and though  she was able to maintain balance w/o issue  Ambulation/Gait Ambulation/Gait assistance: Modified independent (Device/Increase time) Gait Distance (Feet): 200 Feet Assistive device: None       General Gait Details: Pt was able to ambulate with good speed and confidence and did not show any LOBs or excessive fatigue with the effort.  She was able to maintain consistent cadence and did not have any LOBs, limp, etc  Stairs Stairs: Yes Stairs assistance: Supervision Stair Management: One rail Right Number of Stairs: 4 General stair comments: Pt was able to negotiate up/down steps w/o assist or hesitation, used of hand rails that was not excessive  Wheelchair Mobility    Modified Rankin (Stroke Patients Only)       Balance Overall balance assessment: Modified Independent                                           Pertinent Vitals/Pain Pain Assessment: 0-10 Pain Score: 6  Pain Location: headache, chronic neck/R>L shoulder pain Pain Descriptors / Indicators: Headache Pain Intervention(s): Limited activity within patient's tolerance;Monitored during session;Repositioned    Home Living Family/patient expects to be discharged to:: Private residence Living Arrangements: Alone;Other (Comment)(multiple pets at home) Available Help at Discharge: Family;Available PRN/intermittently Type of Home: Mobile home Home Access: Stairs to enter Entrance Stairs-Rails: Right(reports rail is not overly stable) Entrance Stairs-Number of Steps: 5 Home Layout: One level Home Equipment: None      Prior Function Level of Independence: Independent  Comments: Pt indep, drives, O2 at night, "a couple falls" in the last year     Hand Dominance   Dominant Hand: Right    Extremity/Trunk Assessment   Upper Extremity Assessment Upper Extremity Assessment: Defer to OT evaluation RUE Deficits / Details: chronic R shoulder issues (no elevation >70*)- assessment limited  by pain; generally 5/5 otherwise RUE: Unable to fully assess due to pain RUE Sensation: WNL RUE Coordination: WNL LUE Deficits / Details: grossly 4-/5 shoulder flexion, all else 4+/5, denies sensory deficits, no significant FMC deficits appreciated LUE Sensation: WNL LUE Coordination: WNL    Lower Extremity Assessment Lower Extremity Assessment: Overall WFL for tasks assessed(equal and functional b/l) LLE Deficits / Details: grossly 4/5 to 4+/5, denies sensory deficits LLE Sensation: WNL LLE Coordination: WNL    Cervical / Trunk Assessment Cervical / Trunk Assessment: Normal  Communication   Communication: No difficulties  Cognition Arousal/Alertness: Awake/alert Behavior During Therapy: WFL for tasks assessed/performed Overall Cognitive Status: Within Functional Limits for tasks assessed                                        General Comments      Exercises Other Exercises Other Exercises: pt educated in pursed lip breathing to support recovery after exertion; pt verbalized understanding   Assessment/Plan    PT Assessment Patent does not need any further PT services  PT Problem List         PT Treatment Interventions Gait training;DME instruction;Stair training;Functional mobility training;Therapeutic activities;Therapeutic exercise;Balance training;Patient/family education    PT Goals (Current goals can be found in the Care Plan section)  Acute Rehab PT Goals Patient Stated Goal: go home ASAP PT Goal Formulation: All assessment and education complete, DC therapy Time For Goal Achievement: 04/20/19    Frequency Min 2X/week   Barriers to discharge        Co-evaluation               AM-PAC PT "6 Clicks" Mobility  Outcome Measure Help needed turning from your back to your side while in a flat bed without using bedrails?: None Help needed moving from lying on your back to sitting on the side of a flat bed without using bedrails?:  None Help needed moving to and from a bed to a chair (including a wheelchair)?: None Help needed standing up from a chair using your arms (e.g., wheelchair or bedside chair)?: None Help needed to walk in hospital room?: None Help needed climbing 3-5 steps with a railing? : None 6 Click Score: 24    End of Session Equipment Utilized During Treatment: Gait belt Activity Tolerance: Patient tolerated treatment well Patient left: with chair alarm set;with call bell/phone within reach   PT Visit Diagnosis: Muscle weakness (generalized) (M62.81);Other symptoms and signs involving the nervous system (R29.898)    Time: 6063-0160 PT Time Calculation (min) (ACUTE ONLY): 18 min   Charges:   PT Evaluation $PT Eval Low Complexity: 1 Low          Kreg Shropshire, DPT 04/20/2019, 1:57 PM

## 2019-04-20 NOTE — Evaluation (Signed)
Occupational Therapy Evaluation Patient Details Name: Traci Cross MRN: [id number] DOB: 1961-05-01 Today's Date: 04/20/2019    History of Present Illness 58 y.o. female with a known history of asthma and COPD, hypertension, obstructive sleep apnea and ongoing tobacco abuse, who presented to the emergency room with acute onset of left-sided numbness that involved her arm and leg with associated mild weakness that started 04/19/2019. MRI+ for acute infarct posterior R MCA.   Clinical Impression   Pt seen for OT evaluation this date. Prior to hospital admission, pt was independent in all aspects, living alone in a mobile home with 5 steps and R handrail. Pt has 2 dogs, 4 adult cats, and 2 kittens that she cares for at home. She does not work. Currently pt demonstrates impairments in 8-9/10 headache paine, mild impairments in L sided strength, and balance requiring supervision to [place] assist for LB ADL and functional mobility. No significant visual, cognitive, sensory, or [place] deficits appreciated. VSS throughout session. Pt would benefit from skilled OT to address noted impairments and functional limitations (see below for any additional details) in order to maximize safety and independence while minimizing falls risk and caregiver burden. Upon hospital discharge, recommend pt discharge with [facility] services.    Follow Up Recommendations  Home health OT    Equipment Recommendations  None recommended by OT    Recommendations for Other Services       Precautions / Restrictions Precautions Precautions: Fall Restrictions Weight Bearing Restrictions: No      Mobility Bed Mobility Overal bed mobility: Modified Independent Bed Mobility: Supine to Sit;Sit to Supine     Supine to sit: Modified independent (Device/Increase time) Sit to supine: Modified independent (Device/Increase time)      Transfers Overall transfer level: Needs assistance Equipment used: None Transfers: Sit to/from  Stand Sit to Stand: Supervision;Min guard              Balance Overall balance assessment: Mild deficits observed, not formally tested                                         ADL either performed or assessed with clinical judgement   ADL                                         General ADL Comments: Pt able to perform with supervision for safety     Vision Baseline Vision/History: Wears glasses Wears Glasses: Reading only Patient Visual Report: No change from baseline Vision Assessment?: No apparent visual deficits     Perception     Praxis      Pertinent Vitals/Pain Pain Assessment: 0-10 Pain Score: 9  Pain Location: headache Pain Descriptors / Indicators: Headache Pain Intervention(s): Limited activity within patient's tolerance;Monitored during session;Repositioned     Hand Dominance Right   Extremity/Trunk Assessment Upper Extremity Assessment Upper Extremity Assessment: RUE deficits/detail;LUE deficits/detail RUE Deficits / Details: 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 - assessment limited by pain; generally 5/5 otherwise RUE: Unable to fully assess due to pain RUE Sensation: WNL RUE Coordination: WNL LUE Deficits / Details: grossly 4-/5 shoulder flexion, all else 4+/5, denies sensory deficits, no significant FMC deficits appreciated LUE Sensation: WNL LUE Coordination: WNL   Lower Extremity Assessment Lower Extremity Assessment: Defer to PT evaluation;LLE deficits/detail(RLE [facility])  LLE Deficits / Details: grossly 4/5 to 4+/5, denies sensory deficits LLE Sensation: WNL LLE Coordination: WNL   Cervical / Trunk Assessment Cervical / Trunk Assessment: Normal   Communication Communication Communication: No difficulties   Cognition Arousal/Alertness: Awake/alert Behavior During Therapy: WFL for tasks assessed/performed Overall Cognitive Status: Within Functional Limits for tasks assessed                                      General Comments       Exercises Other Exercises Other Exercises: pt educated in pursed lip breathing to support recovery after exertion; pt verbalized understanding   Shoulder Instructions      Home Living Family/patient expects to be discharged to:: Private residence Living Arrangements: Alone;Other (Comment)(has 2 dogs, 4 adult cats, and 2 kittens) Available Help at Discharge: Family;Available PRN/intermittently(daughter lives nearby) Type of Home: Mobile home Home Access: Stairs to enter Secretary/administratorntrance Stairs-Number of Steps: 5 Entrance Stairs-Rails: Right Home Layout: One level     Bathroom Shower/Tub: Chief Strategy OfficerTub/shower unit   Bathroom Toilet: Standard     Home Equipment: None          Prior Functioning/Environment Level of Independence: Independent        Comments: Pt indep, drives, per chart 2L O2 at night, endorses "a couple" falls in past 12 months 2/2 tripping over items        OT Problem List: Decreased strength;Pain      OT Treatment/Interventions: Self-care/ADL training;Therapeutic exercise;Neuromuscular education;Therapeutic activities;DME and/or AE instruction;Patient/family education;Balance training;Energy conservation    OT Goals(Current goals can be found in the care plan section) Acute Rehab OT Goals Patient Stated Goal: get rid of headache OT Goal Formulation: With patient Time For Goal Achievement: 05/04/19 Potential to Achieve Goals: Good ADL Goals Pt Will Perform Lower Body Dressing: with modified independence;sit to/from stand Pt Will Transfer to Toilet: with modified independence;ambulating;regular height toilet(LRAD for amb) Additional ADL Goal #1: Pt will verbalize plan to implement at least 1 learned ECS strategy to minimize SOB during ADL.  OT Frequency: Min 1X/week   Barriers to D/C:            Co-evaluation              AM-PAC OT "6 Clicks" Daily Activity     Outcome Measure Help from another person eating  meals?: None Help from another person taking care of personal grooming?: None Help from another person toileting, which includes using toliet, bedpan, or urinal?: A Little Help from another person bathing (including washing, rinsing, drying)?: A Little Help from another person to put on and taking off regular upper body clothing?: None Help from another person to put on and taking off regular lower body clothing?: A Little 6 Click Score: 21   End of Session Equipment Utilized During Treatment: Gait belt  Activity Tolerance: Patient tolerated treatment well;Patient limited by pain Patient left: in bed;with call bell/phone within reach;with bed alarm set  OT Visit Diagnosis: Other abnormalities of gait and mobility (R26.89);Hemiplegia and hemiparesis Hemiplegia - Right/Left: Left Hemiplegia - dominant/non-dominant: Non-Dominant Hemiplegia - caused by: Cerebral infarction                Time: 1000-1013 OT Time Calculation (min): 13 min Charges:  OT General Charges $OT Visit: 1 Visit OT Evaluation $OT Eval Low Complexity: 1 Low  Richrd PrimeJamie Stiller, MPH, MS, OTR/L ascom [phone] 04/20/19, 10:45 AM

## 2019-04-20 NOTE — Progress Notes (Addendum)
[employer] [facility] [place] [facility]   PATIENT NAME: Traci Cross    MR#:  [id number]  DATE OF BIRTH:  [birth year]  SUBJECTIVE:  CHIEF COMPLAINT:   Chief Complaint  Patient presents with  . Cerebrovascular Accident   Headache, left side weakness REVIEW OF SYSTEMS:  Review of Systems  Constitutional: Negative for chills, fever and malaise/fatigue.  HENT: Negative for sore throat.   Eyes: Negative for blurred vision and double vision.  Respiratory: Negative for cough, hemoptysis, shortness of breath, wheezing and stridor.   Cardiovascular: Negative for chest pain, palpitations, orthopnea and leg swelling.  Gastrointestinal: Negative for abdominal pain, blood in stool, diarrhea, melena, nausea and vomiting.  Genitourinary: Negative for dysuria, flank pain and hematuria.  Musculoskeletal: Negative for back pain and joint pain.  Skin: Negative for rash.  Neurological: Positive for focal weakness and headaches. Negative for dizziness, sensory change, seizures, loss of consciousness and weakness.  Endo/Heme/Allergies: Negative for polydipsia.  Psychiatric/Behavioral: Negative for depression. The patient is not nervous/anxious.     DRUG ALLERGIES:   Allergies  Allergen Reactions  . Levaquin [Levofloxacin] Hives    Dr notified.   . Codeine Hives  . Penicillins Hives   VITALS:  Blood pressure (!) 158/88, pulse 75, temperature 98.1 F (36.7 C), temperature source Oral, resp. rate 20, height 5' 7.5" (1.715 m), weight 94.2 kg, SpO2 94 %. PHYSICAL EXAMINATION:  Physical Exam Constitutional:      General: She is not in acute distress. HENT:     Head: Normocephalic.     Mouth/Throat:     Mouth: Mucous membranes are moist.  Eyes:     General: No scleral icterus.    Conjunctiva/sclera: Conjunctivae normal.     Pupils: Pupils are equal, round, and reactive to light.  Neck:     Musculoskeletal: Normal range of motion and neck supple.     Vascular: No JVD.      Trachea: No tracheal deviation.  Cardiovascular:     Rate and Rhythm: Normal rate and regular rhythm.     Heart sounds: Normal heart sounds. No murmur. No gallop.   Pulmonary:     Effort: Pulmonary effort is normal. No respiratory distress.     Breath sounds: Normal breath sounds. No wheezing or rales.  Abdominal:     General: Bowel sounds are normal. There is no distension.     Palpations: Abdomen is soft.     Tenderness: There is no abdominal tenderness. There is no rebound.  Musculoskeletal: Normal range of motion.        General: No tenderness.     Right lower leg: No edema.     Left lower leg: No edema.  Skin:    Findings: No erythema or rash.  Neurological:     General: No focal deficit present.     Mental Status: She is alert and oriented to person, place, and time.     Cranial Nerves: No cranial nerve deficit.  Psychiatric:        Mood and Affect: Mood normal.    LABORATORY PANEL:  Female CBC Recent Labs  Lab 04/19/19 1309  WBC 8.0  HGB 14.4  HCT 42.7  PLT 195   ------------------------------------------------------------------------------------------------------------------ Chemistries  Recent Labs  Lab 04/19/19 1309 04/20/19 0530  NA 141 140  K 3.3* 3.3*  CL 108 108  CO2 24 25  GLUCOSE 99 103*  BUN 14 16  CREATININE 0.76 0.70  CALCIUM 8.3* 8.1*  MG  --  2.3  AST 19  --   ALT 15  --   ALKPHOS 71  --   BILITOT 0.4  --    RADIOLOGY:  Mr Laqueta JeanBrain W Wo Contrast  Result Date: 04/19/2019 CLINICAL DATA:  58 year old female with left side weakness, shortness of breath. EXAM: MRI HEAD WITHOUT AND WITH CONTRAST MRI CERVICAL SPINE WITHOUT CONTRAST TECHNIQUE: Multiplanar, multiecho pulse sequences of the brain were obtained without and with intravenous contrast. Multiplanar multisequence noncontrast imaging of the cervical spine. CONTRAST:  9 milliliters Gadavist. COMPARISON:  Cervical spine MRI 03/21/2019. Head CT 1327 hours today and earlier. FINDINGS: MRI HEAD  FINDINGS Brain: There is a linear 10 millimeter area of restricted diffusion in the posterior right corona radiata tracking to the subcortical white matter of the motor strip (series 5, image 40 and series 7, image 17. No associated hemorrhage or mass effect. Underlying Patchy and confluent bilateral cerebral white matter T2 and FLAIR hyperintensity, especially in the periatrial regions. No other restricted diffusion. No midline shift, mass effect, evidence of mass lesion, ventriculomegaly, extra-axial collection or acute intracranial hemorrhage. Cervicomedullary junction and pituitary are within normal limits. Possible chronic microhemorrhage at the left parieto-occipital sulcus on series 11, image 16. No cortical encephalomalacia identified. Negative deep gray matter nuclei, brainstem and cerebellum. No abnormal enhancement identified.  No dural thickening. Vascular: Major intracranial vascular flow voids are preserved. The major dural venous sinuses appear to be enhancing and patent. Skull and upper cervical spine: Hyperostosis of the calvarium. Normal bone marrow signal at the skull base. Cervical spine described below. Sinuses/Orbits: Negative orbits.  Paranasal sinuses are clear. Other: Mastoids are clear. Grossly normal visible internal auditory structures. Scalp and face soft tissues appear negative. MRI CERVICAL SPINE FINDINGS Alignment: Improved cervical lordosis compared to 03/21/2019. No spondylolisthesis. Vertebrae: No marrow edema or evidence of acute osseous abnormality. Cord: Spinal cord signal is within normal limits at all visualized levels. Posterior Fossa, vertebral arteries, paraspinal tissues: Cervicomedullary junction is within normal limits. Preserved major vascular flow voids in the neck. Negative neck soft tissues and visible lung apices. Disc levels: Stable since 03/21/2019 and notable for: C4-C5: Circumferential disc bulge with endplate spurring and mild facet hypertrophy. Mild spinal  stenosis and spinal cord mass effect. Moderate left and mild right C5 foraminal stenosis. C5-C6: Disc space loss with circumferential disc osteophyte complex eccentric to the left. Borderline to mild spinal stenosis, no cord mass effect. Moderate to severe left and mild-to-moderate right C6 foraminal stenosis. IMPRESSION: 1. 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 No associated hemorrhage or mass effect. 2. Moderate underlying cerebral white matter disease, most commonly due to chronic small vessel ischemia. 3. Stable MRI appearance of the cervical spine since June: - C4-C5 mild spinal stenosis and mild spinal cord mass effect but no cord signal abnormality. Up to moderate left neural foraminal stenosis. - C5-C6 moderate to severe left greater than right neural foraminal stenosis. Electronically Signed   By: Odessa FlemingH  Hall M.D.   On: 04/19/2019 19:31   Mr Cervical Spine Wo Contrast  Result Date: 04/19/2019 CLINICAL DATA:  58 year old female with left side weakness, shortness of breath. EXAM: MRI HEAD WITHOUT AND WITH CONTRAST MRI CERVICAL SPINE WITHOUT CONTRAST TECHNIQUE: Multiplanar, multiecho pulse sequences of the brain were obtained without and with intravenous contrast. Multiplanar multisequence noncontrast imaging of the cervical spine. CONTRAST:  9 milliliters Gadavist. COMPARISON:  Cervical spine MRI 03/21/2019. Head CT 1327 hours today and earlier. FINDINGS: MRI  HEAD FINDINGS Brain: There is a linear 10 millimeter area of restricted diffusion in the posterior right corona radiata tracking to the subcortical white matter of the motor strip (series 5, image 40 and series 7, image 17. No associated hemorrhage or mass effect. Underlying Patchy and confluent bilateral cerebral white matter T2 and FLAIR hyperintensity, especially in the periatrial regions. No other restricted diffusion. No midline shift, mass effect, evidence of mass lesion,  ventriculomegaly, extra-axial collection or acute intracranial hemorrhage. Cervicomedullary junction and pituitary are within normal limits. Possible chronic microhemorrhage at the left parieto-occipital sulcus on series 11, image 16. No cortical encephalomalacia identified. Negative deep gray matter nuclei, brainstem and cerebellum. No abnormal enhancement identified.  No dural thickening. Vascular: Major intracranial vascular flow voids are preserved. The major dural venous sinuses appear to be enhancing and patent. Skull and upper cervical spine: Hyperostosis of the calvarium. Normal bone marrow signal at the skull base. Cervical spine described below. Sinuses/Orbits: Negative orbits.  Paranasal sinuses are clear. Other: Mastoids are clear. Grossly normal visible internal auditory structures. Scalp and face soft tissues appear negative. MRI CERVICAL SPINE FINDINGS Alignment: Improved cervical lordosis compared to 03/21/2019. No spondylolisthesis. Vertebrae: No marrow edema or evidence of acute osseous abnormality. Cord: Spinal cord signal is within normal limits at all visualized levels. Posterior Fossa, vertebral arteries, paraspinal tissues: Cervicomedullary junction is within normal limits. Preserved major vascular flow voids in the neck. Negative neck soft tissues and visible lung apices. Disc levels: Stable since 03/21/2019 and notable for: C4-C5: Circumferential disc bulge with endplate spurring and mild facet hypertrophy. Mild spinal stenosis and spinal cord mass effect. Moderate left and mild right C5 foraminal stenosis. C5-C6: Disc space loss with circumferential disc osteophyte complex eccentric to the left. Borderline to mild spinal stenosis, no cord mass effect. Moderate to severe left and mild-to-moderate right C6 foraminal stenosis. IMPRESSION: 1. 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 No associated hemorrhage or mass effect. 2.  Moderate underlying cerebral white matter disease, most commonly due to chronic small vessel ischemia. 3. Stable MRI appearance of the cervical spine since June: - C4-C5 mild spinal stenosis and mild spinal cord mass effect but no cord signal abnormality. Up to moderate left neural foraminal stenosis. - C5-C6 moderate to severe left greater than right neural foraminal stenosis. Electronically Signed   By: Genevie Ann M.D.   On: 04/19/2019 19:31   US Carotid Bilateral (at [facility] And [facility] Only)  Result Date: 04/20/2019 CLINICAL DATA:  Hypertension, stroke, tobacco use EXAM: BILATERAL CAROTID DUPLEX ULTRASOUND TECHNIQUE: Pearline Cables scale imaging, color Doppler and duplex ultrasound were performed of bilateral carotid and vertebral arteries in the neck. COMPARISON:  None. FINDINGS: Criteria: Quantification of carotid stenosis is based on velocity parameters that correlate the residual internal carotid diameter with NASCET-based stenosis levels, using the diameter of the distal internal carotid lumen as the denominator for stenosis measurement. The following velocity measurements were obtained: RIGHT ICA: 45/16 cm/sec CCA: 29/52 cm/sec SYSTOLIC ICA/CCA RATIO:  0.8 ECA: 93 cm/sec LEFT ICA: 85/33 cm/sec CCA: 84/13 cm/sec SYSTOLIC ICA/CCA RATIO:  1.2 ECA: 50 cm/sec RIGHT CAROTID ARTERY: Minor echogenic shadowing plaque formation. No hemodynamically significant right ICA stenosis, velocity elevation, or turbulent flow. Degree of narrowing less than 50%. RIGHT VERTEBRAL ARTERY:  Antegrade LEFT CAROTID ARTERY: Similar scattered minor echogenic plaque formation. No hemodynamically significant left ICA stenosis, velocity elevation, or turbulent flow. LEFT VERTEBRAL ARTERY:  Antegrade IMPRESSION: Minor carotid atherosclerosis. No hemodynamically  significant ICA stenosis. Degree of narrowing less than 50% bilaterally by ultrasound criteria. Patent antegrade vertebral flow bilaterally Electronically Signed   By: Judie PetitM.  Shick M.D.   On:  04/20/2019 09:36   ASSESSMENT AND PLAN:   1. Acute right posterior MCA territory infarction with subsequent left-sided numbness and weakness. Per Dr. Thad Rangereynolds, prophylactic therapy-Dual antiplatelet therapy with ASA 81mg  and Plavix 75mg  for three weeks with change toASA 81mg alone as monotherapy after that time. Carotid duplex is unremarkable.  Echo is pending.  2. COPD/asthma.The patient will be placed on PRN duo nebs and will hold off her Symbicort.  3. Ongoing tobacco abuse. Smoking cessation was counseled for 3 to 4 minutes, on NicoDerm CQ patch.  4. Chronic respiratory failure on home O2 at night.  In room air without hypoxia. 5. Hypertension.  Continue home hypertension medication.  Hypokalemia.  Potassium supplement.  Follow-up level as outpatient. PT evaluation suggest home OT. I discussed with Dr. Thad Rangereynolds. All the records are reviewed and case discussed with Care Management/Social Worker. Management plans discussed with the patient, family and they are in agreement.  CODE STATUS: Full Code  TOTAL TIME TAKING CARE OF THIS PATIENT: 36 minutes.   More than 50% of the time was spent in counseling/coordination of care: YES  POSSIBLE D/C IN 1-2 DAYS, DEPENDING ON CLINICAL CONDITION.   Shaune PollackQing Riely Baskett M.D on 04/20/2019 at 6:02 PM  Between 7am to 6pm - Pager - [phone]  After 6pm go to [email or web address] - Therapist, nutritionalpassword EPAS [facility]  [facility] [place] [facility]

## 2019-04-20 NOTE — Discharge Instructions (Signed)
Prophylactic therapy-Dual antiplatelet therapy with ASA 81mg  and Plavix 75mg  for three weeks with change to ASA 81mg  alone as monotherapy after that time. Smoking cessation. HHPTOT

## 2019-04-20 NOTE — Consult Note (Signed)
Referring Physician: Imogene Burnhen    Chief Complaint: Left sided numbness  HPI: Traci Cross is an 58 y.o. female with a history of COPD who reports that at 0300 on 7/3 she awakened, which was unusual for her, and put on her oxygen.  She reports that she did not fully awaken and is not sure if she was at baseline at that time.  When she finally got up later in the morning she noted that her left side was numb and weak.  Patient presented after there was no improvement in these symptoms.  Initial NIHSS of 0.   Date last known well: Date: 04/18/2019 Time last known well: Unable to determine tPA Given: No: Unable to determine LKW  Past Medical History:  Diagnosis Date  . Asthma   . COPD (chronic obstructive pulmonary disease) (HCC)   . Hypertension   . Sleep apnea   . Tobacco abuse     Past Surgical History:  Procedure Laterality Date  . CHOLECYSTECTOMY    . TONSILLECTOMY      Family History  Problem Relation Age of Onset  . Heart failure Mother   . Stroke Mother   . Heart failure Father   . Stroke Father    Social History:  reports that she has been smoking cigarettes. She has a 8.75 pack-year smoking history. She has never used smokeless tobacco. She reports that she does not drink alcohol or use drugs.  Allergies:  Allergies  Allergen Reactions  . Levaquin [Levofloxacin] Hives    Dr notified.   . Codeine Hives  . Penicillins Hives    Medications:  I have reviewed the patient's current medications. Prior to Admission:  Medications Prior to Admission  Medication Sig Dispense Refill Last Dose  . albuterol (VENTOLIN HFA) 108 (90 Base) MCG/ACT inhaler Inhale 2 puffs into the lungs every 6 (six) hours as needed for wheezing or shortness of breath. 3 Inhaler 3   . aspirin EC 81 MG tablet Take 1 tablet (81 mg total) by mouth daily. 30 tablet 11   . budesonide-formoterol (SYMBICORT) 160-4.5 MCG/ACT inhaler Inhale 2 puffs into the lungs 2 (two) times daily. 1 Inhaler 2   .  butalbital-acetaminophen-caffeine (FIORICET, ESGIC) 50-325-40 MG tablet Take 1 tablet by mouth every 6 (six) hours as needed for headache. 20 tablet 0   . hydrochlorothiazide (HYDRODIURIL) 25 MG tablet Take 1 tablet (25 mg total) by mouth daily. 90 tablet 3   . HYDROcodone-acetaminophen (NORCO/VICODIN) 5-325 MG tablet Take 1 tablet by mouth every 6 (six) hours as needed for moderate pain. 15 tablet 0    Scheduled: . aspirin EC  81 mg Oral Daily  . atorvastatin  40 mg Oral q1800  . clopidogrel  75 mg Oral Daily  . enoxaparin (LOVENOX) injection  40 mg Subcutaneous Q24H  . hydrochlorothiazide  25 mg Oral Daily  . nicotine  21 mg Transdermal Daily  . nicotine  21 mg Transdermal Daily    ROS: History obtained from the patient  General ROS: negative for - chills, fatigue, fever, night sweats, weight gain or weight loss Psychological ROS: negative for - behavioral disorder, hallucinations, memory difficulties, mood swings or suicidal ideation Ophthalmic ROS: negative for - blurry vision, double vision, eye pain or loss of vision ENT ROS: negative for - epistaxis, nasal discharge, oral lesions, sore throat, tinnitus or vertigo Allergy and Immunology ROS: negative for - hives or itchy/watery eyes Hematological and Lymphatic ROS: negative for - bleeding problems, bruising or swollen lymph nodes  Endocrine ROS: negative for - galactorrhea, hair pattern changes, polydipsia/polyuria or temperature intolerance Respiratory ROS: cough Cardiovascular ROS: negative for - chest pain, dyspnea on exertion, edema or irregular heartbeat Gastrointestinal ROS: negative for - abdominal pain, diarrhea, hematemesis, nausea/vomiting or stool incontinence Genito-Urinary ROS: negative for - dysuria, hematuria, incontinence or urinary frequency/urgency Musculoskeletal ROS: negative for - joint swelling or muscular weakness Neurological ROS: as noted in HPI Dermatological ROS: negative for rash and skin lesion  changes  Physical Examination: Blood pressure (!) 177/88, pulse 74, temperature 97.6 F (36.4 C), temperature source Oral, resp. rate 16, height 5' 7.5" (1.715 m), weight 94.2 kg, SpO2 96 %.  HEENT-  Normocephalic, no lesions, without obvious abnormality.  Normal external eye and conjunctiva.  Normal TM's bilaterally.  Normal auditory canals and external ears. Normal external nose, mucus membranes and septum.  Normal pharynx. Cardiovascular- S1, S2 normal, pulses palpable throughout   Lungs- chest clear, no wheezing, rales, normal symmetric air entry Abdomen- soft, non-tender; bowel sounds normal; no masses,  no organomegaly Extremities- no edema Lymph-no adenopathy palpable Musculoskeletal-no joint tenderness, deformity or swelling Skin-warm and dry, no hyperpigmentation, vitiligo, or suspicious lesions  Neurological Examination   Mental Status: Alert, oriented, apathetic.  Speech fluent without evidence of aphasia.  Able to follow 3 step commands without difficulty. Cranial Nerves: II: Discs flat bilaterally; Visual fields grossly normal, pupils equal, round, reactive to light and accommodation III,IV, VI: ptosis not present, extra-ocular motions intact bilaterally V,VII: smile symmetric, facial light touch sensation normal bilaterally VIII: hearing normal bilaterally IX,X: gag reflex present XI: bilateral shoulder shrug XII: midline tongue extension Motor: Right : Upper extremity   5/5    Left:     Upper extremity   5/5  Lower extremity   5/5     Lower extremity   5-/5 Tone and bulk:normal tone throughout; no atrophy noted Sensory: Pinprick and light touch intact throughout, bilaterally Deep Tendon Reflexes: 2+ and symmetric with absent AJ's bilaterally Plantars: Right: downgoing   Left: upgoing Cerebellar: Normal finger-to-nose and normal heel-to-shin testing bilaterally Gait: not tested due to safety concerns    Laboratory Studies:  Basic Metabolic Panel: Recent Labs   Lab 04/19/19 1309 04/20/19 0530  NA 141 140  K 3.3* 3.3*  CL 108 108  CO2 24 25  GLUCOSE 99 103*  BUN 14 16  CREATININE 0.76 0.70  CALCIUM 8.3* 8.1*    Liver Function Tests: Recent Labs  Lab 04/19/19 1309  AST 19  ALT 15  ALKPHOS 71  BILITOT 0.4  PROT 6.5  ALBUMIN 3.7   No results for input(s): LIPASE, AMYLASE in the last 168 hours. No results for input(s): AMMONIA in the last 168 hours.  CBC: Recent Labs  Lab 04/19/19 1309  WBC 8.0  NEUTROABS 4.9  HGB 14.4  HCT 42.7  MCV 87.3  PLT 195    Cardiac Enzymes: No results for input(s): CKTOTAL, CKMB, CKMBINDEX, TROPONINI in the last 168 hours.  BNP: Invalid input(s): POCBNP  CBG: Recent Labs  Lab 04/19/19 1300  GLUCAP 103*    Microbiology: No results found for this or any previous visit.  Coagulation Studies: Recent Labs    04/19/19 1309  LABPROT 12.1  INR 0.9    Urinalysis: No results for input(s): COLORURINE, LABSPEC, PHURINE, GLUCOSEU, HGBUR, BILIRUBINUR, KETONESUR, PROTEINUR, UROBILINOGEN, NITRITE, LEUKOCYTESUR in the last 168 hours.  Invalid input(s): APPERANCEUR  Lipid Panel:    Component Value Date/Time   CHOL 151 04/20/2019 0530   CHOL 181 07/26/2018  1696   TRIG 265 (H) 04/20/2019 0530   HDL 33 (L) 04/20/2019 0530   HDL 48 07/26/2018 0833   CHOLHDL 4.6 04/20/2019 0530   VLDL 53 (H) 04/20/2019 0530   LDLCALC 65 04/20/2019 0530   LDLCALC 117 (H) 07/26/2018 0833    HgbA1C:  Lab Results  Component Value Date   HGBA1C 5.4 04/20/2019    Urine Drug Screen:  No results found for: LABOPIA, COCAINSCRNUR, LABBENZ, AMPHETMU, THCU, LABBARB  Alcohol Level: No results for input(s): ETH in the last 168 hours.  Other results: EKG: sinus rhythm at 86 bpm.  Imaging: Ct Head Wo Contrast  Result Date: 04/19/2019 CLINICAL DATA:  Patient with shortness of breath. Left-sided weakness. EXAM: CT HEAD WITHOUT CONTRAST TECHNIQUE: Contiguous axial images were obtained from the base of the skull  through the vertex without intravenous contrast. COMPARISON:  None. FINDINGS: Brain: Ventricles and sulci are appropriate for patient's age. No evidence for acute cortically based infarct, intracranial hemorrhage, mass lesion or mass-effect. Vascular: Unremarkable Skull: Intact. Sinuses/Orbits: Paranasal sinuses are well aerated. Mastoid air cells are unremarkable. Orbits are unremarkable. Other: None. IMPRESSION: No acute intracranial process. Electronically Signed   By: Lovey Newcomer M.D.   On: 04/19/2019 13:51   Mr Jeri Cos VE Contrast  Result Date: 04/19/2019 CLINICAL DATA:  58 year old female with left side weakness, shortness of breath. EXAM: MRI HEAD WITHOUT AND WITH CONTRAST MRI CERVICAL SPINE WITHOUT CONTRAST TECHNIQUE: Multiplanar, multiecho pulse sequences of the brain were obtained without and with intravenous contrast. Multiplanar multisequence noncontrast imaging of the cervical spine. CONTRAST:  9 milliliters Gadavist. COMPARISON:  Cervical spine MRI 03/21/2019. Head CT 1327 hours today and earlier. FINDINGS: MRI HEAD FINDINGS Brain: There is a linear 10 millimeter area of restricted diffusion in the posterior right corona radiata tracking to the subcortical white matter of the motor strip (series 5, image 40 and series 7, image 17. No associated hemorrhage or mass effect. Underlying Patchy and confluent bilateral cerebral white matter T2 and FLAIR hyperintensity, especially in the periatrial regions. No other restricted diffusion. No midline shift, mass effect, evidence of mass lesion, ventriculomegaly, extra-axial collection or acute intracranial hemorrhage. Cervicomedullary junction and pituitary are within normal limits. Possible chronic microhemorrhage at the left parieto-occipital sulcus on series 11, image 16. No cortical encephalomalacia identified. Negative deep gray matter nuclei, brainstem and cerebellum. No abnormal enhancement identified.  No dural thickening. Vascular: Major intracranial  vascular flow voids are preserved. The major dural venous sinuses appear to be enhancing and patent. Skull and upper cervical spine: Hyperostosis of the calvarium. Normal bone marrow signal at the skull base. Cervical spine described below. Sinuses/Orbits: Negative orbits.  Paranasal sinuses are clear. Other: Mastoids are clear. Grossly normal visible internal auditory structures. Scalp and face soft tissues appear negative. MRI CERVICAL SPINE FINDINGS Alignment: Improved cervical lordosis compared to 03/21/2019. No spondylolisthesis. Vertebrae: No marrow edema or evidence of acute osseous abnormality. Cord: Spinal cord signal is within normal limits at all visualized levels. Posterior Fossa, vertebral arteries, paraspinal tissues: Cervicomedullary junction is within normal limits. Preserved major vascular flow voids in the neck. Negative neck soft tissues and visible lung apices. Disc levels: Stable since 03/21/2019 and notable for: C4-C5: Circumferential disc bulge with endplate spurring and mild facet hypertrophy. Mild spinal stenosis and spinal cord mass effect. Moderate left and mild right C5 foraminal stenosis. C5-C6: Disc space loss with circumferential disc osteophyte complex eccentric to the left. Borderline to mild spinal stenosis, no cord mass effect. Moderate to severe  left and mild-to-moderate right C6 foraminal stenosis. IMPRESSION: 1. 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 No associated hemorrhage or mass effect. 2. Moderate underlying cerebral white matter disease, most commonly due to chronic small vessel ischemia. 3. Stable MRI appearance of the cervical spine since June: - C4-C5 mild spinal stenosis and mild spinal cord mass effect but no cord signal abnormality. Up to moderate left neural foraminal stenosis. - C5-C6 moderate to severe left greater than right neural foraminal stenosis. Electronically Signed   By: Odessa FlemingH  Hall M.D.   On:  04/19/2019 19:31   Mr Cervical Spine Wo Contrast  Result Date: 04/19/2019 CLINICAL DATA:  58 year old female with left side weakness, shortness of breath. EXAM: MRI HEAD WITHOUT AND WITH CONTRAST MRI CERVICAL SPINE WITHOUT CONTRAST TECHNIQUE: Multiplanar, multiecho pulse sequences of the brain were obtained without and with intravenous contrast. Multiplanar multisequence noncontrast imaging of the cervical spine. CONTRAST:  9 milliliters Gadavist. COMPARISON:  Cervical spine MRI 03/21/2019. Head CT 1327 hours today and earlier. FINDINGS: MRI HEAD FINDINGS Brain: There is a linear 10 millimeter area of restricted diffusion in the posterior right corona radiata tracking to the subcortical white matter of the motor strip (series 5, image 40 and series 7, image 17. No associated hemorrhage or mass effect. Underlying Patchy and confluent bilateral cerebral white matter T2 and FLAIR hyperintensity, especially in the periatrial regions. No other restricted diffusion. No midline shift, mass effect, evidence of mass lesion, ventriculomegaly, extra-axial collection or acute intracranial hemorrhage. Cervicomedullary junction and pituitary are within normal limits. Possible chronic microhemorrhage at the left parieto-occipital sulcus on series 11, image 16. No cortical encephalomalacia identified. Negative deep gray matter nuclei, brainstem and cerebellum. No abnormal enhancement identified.  No dural thickening. Vascular: Major intracranial vascular flow voids are preserved. The major dural venous sinuses appear to be enhancing and patent. Skull and upper cervical spine: Hyperostosis of the calvarium. Normal bone marrow signal at the skull base. Cervical spine described below. Sinuses/Orbits: Negative orbits.  Paranasal sinuses are clear. Other: Mastoids are clear. Grossly normal visible internal auditory structures. Scalp and face soft tissues appear negative. MRI CERVICAL SPINE FINDINGS Alignment: Improved cervical  lordosis compared to 03/21/2019. No spondylolisthesis. Vertebrae: No marrow edema or evidence of acute osseous abnormality. Cord: Spinal cord signal is within normal limits at all visualized levels. Posterior Fossa, vertebral arteries, paraspinal tissues: Cervicomedullary junction is within normal limits. Preserved major vascular flow voids in the neck. Negative neck soft tissues and visible lung apices. Disc levels: Stable since 03/21/2019 and notable for: C4-C5: Circumferential disc bulge with endplate spurring and mild facet hypertrophy. Mild spinal stenosis and spinal cord mass effect. Moderate left and mild right C5 foraminal stenosis. C5-C6: Disc space loss with circumferential disc osteophyte complex eccentric to the left. Borderline to mild spinal stenosis, no cord mass effect. Moderate to severe left and mild-to-moderate right C6 foraminal stenosis. IMPRESSION: 1. 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 No associated hemorrhage or mass effect. 2. Moderate underlying cerebral white matter disease, most commonly due to chronic small vessel ischemia. 3. Stable MRI appearance of the cervical spine since June: - C4-C5 mild spinal stenosis and mild spinal cord mass effect but no cord signal abnormality. Up to moderate left neural foraminal stenosis. - C5-C6 moderate to severe left greater than right neural foraminal stenosis. Electronically Signed   By: Odessa FlemingH  Hall M.D.   On: 04/19/2019 19:31  [place] Carotid Bilateral (at [facility] Only)  Result Date: 04/20/2019 CLINICAL DATA:  Hypertension, stroke, tobacco use EXAM: BILATERAL CAROTID DUPLEX ULTRASOUND TECHNIQUE: Wallace CullensGray scale imaging, color Doppler and duplex ultrasound were performed of bilateral carotid and vertebral arteries in the neck. COMPARISON:  None. FINDINGS: Criteria: Quantification of carotid stenosis is based on velocity parameters that correlate the residual internal carotid diameter with  NASCET-based stenosis levels, using the diameter of the distal internal carotid lumen as the denominator for stenosis measurement. The following velocity measurements were obtained: RIGHT ICA: 45/16 cm/sec CCA: 55/21 cm/sec SYSTOLIC ICA/CCA RATIO:  0.8 ECA: 93 cm/sec LEFT ICA: 85/33 cm/sec CCA: 69/14 cm/sec SYSTOLIC ICA/CCA RATIO:  1.2 ECA: 50 cm/sec RIGHT CAROTID ARTERY: Minor echogenic shadowing plaque formation. No hemodynamically significant right ICA stenosis, velocity elevation, or turbulent flow. Degree of narrowing less than 50%. RIGHT VERTEBRAL ARTERY:  Antegrade LEFT CAROTID ARTERY: Similar scattered minor echogenic plaque formation. No hemodynamically significant left ICA stenosis, velocity elevation, or turbulent flow. LEFT VERTEBRAL ARTERY:  Antegrade IMPRESSION: Minor carotid atherosclerosis. No hemodynamically significant ICA stenosis. Degree of narrowing less than 50% bilaterally by ultrasound criteria. Patent antegrade vertebral flow bilaterally Electronically Signed   By: Judie PetitM.  Shick M.D.   On: 04/20/2019 09:36    Assessment: [age] y.o. female presenting with complaints of left sided numbness.  Some mild weakness noted on neurological examination as well.  MRI of the brain reviewed and shows a small, acute infarct in the right MCA distribution.  Patient not compliant with her ASA at home.  Carotid dopplers show no evidence of hemodynamically significant stenosis.  Echocardiogram pending.  A1c 5.4, LDL 65.  Stroke Risk Factors - hypertension and smoking  Plan: 1. PT consult, OT consult, Speech consult 2. Echocardiogram pending 3. Prophylactic therapy-Dual antiplatelet therapy with ASA 81mg  and Plavix 75mg  for three weeks with change to ASA 81mg  alone as monotherapy after that time. 4. NPO until RN stroke swallow screen 5. Telemetry monitoring 6. Frequent neuro checks 7. Smoking cessation counseling   Thana FarrLeslie Zenaida Tesar, MD Neurology [phone] 04/20/2019, 9:49 AM

## 2019-04-20 NOTE — TOC Transition Note (Signed)
Transition of Care [facility]) - CM/SW Discharge Note   Patient Details  Name: SHIMIKA AMES MRN: [id number] Date of Birth: 1961-06-29  [facility]) CM/SW Contact:  Latanya Maudlin, RN Phone Number: 04/20/2019, 1:51 PM   Clinical Narrative: Patient to be discharged per MD order. Orders in place for home health services. Patient has worked with OT but has not yet worked with PT. Patient reports she has been up independently in the room. Patient still drives. Uses no DME at baseline. Patient prefers outpatient PT opposed to home health at this time. PT is still pending so I have told her we could adjust the plan if she feels differently after PT. Patient prefers to use [facility] as she has been there before. I have provided the contact info as they are closed today.      Final next level of care: Home/Self Care     Patient Goals and CMS Choice   CMS Medicare.gov Compare Post Acute Care list provided to:: Patient Choice offered to / list presented to : Patient  Discharge Placement                       Discharge Plan and Services                          HH Arranged: NA, Patient Refused HH          Social Determinants of Health ([place]) Interventions     Readmission Risk Interventions Readmission Risk Prevention Plan 04/20/2019  Transportation Screening Complete  PCP or Specialist Appt within 5-7 Days Complete  Home Care Screening Complete  Medication Review (RN CM) Complete  Some recent data might be hidden

## 2019-04-20 NOTE — Progress Notes (Signed)
SLP Cancellation Note  Patient Details Name: DESMA WILKOWSKI MRN: [id number] DOB: April 11, 1961   Cancelled treatment:       Reason Eval/Treat Not Completed: SLP screened, no needs identified, will sign off(chart reviewed; consulted NSG then met w/ pt in room). Pt denied any difficulty swallowing and is currently on a regular diet - she had eaten most of the breakfast meal; adhesive noted for Denture. She tolerates swallowing pills w/ water per NSG. Pt conversed at conversational level w/out deficits noted; pt and NSG denied any speech-language deficits. Pt indicated need to go to bathroom; NSG present to assist. No further skilled ST services indicated as pt appears at her baseline. Recommended f/u w/ PCP if any cognitive-linguistic deficits noted once at home post discharge. Pt agreed. NSG to reconsult if any change in status while admitted.      Orinda Kenner, [place], CCC-SLP [place] 04/20/2019, 9:21 AM

## 2019-04-21 ENCOUNTER — Inpatient Hospital Stay (HOSPITAL_COMMUNITY)
Admit: 2019-04-21 | Discharge: 2019-04-21 | Disposition: A | Discharge status: Home / Self Care | Payer: Medicaid Other | Attending: Family Medicine | Admitting: Family Medicine

## 2019-04-21 ENCOUNTER — Inpatient Hospital Stay: Admit: 2019-04-21 | Payer: Medicaid Other

## 2019-04-21 DIAGNOSIS — I639 Cerebral infarction, unspecified: Secondary | ICD-10-CM

## 2019-04-21 LAB — ECHOCARDIOGRAM COMPLETE
Height: 67.5 in
Weight: 3321.6 oz

## 2019-04-21 NOTE — Progress Notes (Signed)
*  PRELIMINARY RESULTS* Echocardiogram 2D Echocardiogram has been performed.  Traci Cross Traci Cross 04/21/2019, 12:19 PM

## 2019-04-21 NOTE — Plan of Care (Signed)
  Problem: Education: Goal: Knowledge of disease or condition will improve Outcome: Progressing Goal: Knowledge of secondary prevention will improve Outcome: Progressing Goal: Knowledge of patient specific risk factors addressed and post discharge goals established will improve Outcome: Progressing   Problem: Coping: Goal: Will verbalize positive feelings about self Outcome: Progressing Goal: Will identify appropriate support needs Outcome: Progressing   Problem: Health Behavior/Discharge Planning: Goal: Ability to manage health-related needs will improve Outcome: Progressing   Problem: Self-Care: Goal: Ability to participate in self-care as condition permits will improve Outcome: Progressing Goal: Verbalization of feelings and concerns over difficulty with self-care will improve Outcome: Progressing Goal: Ability to communicate needs accurately will improve Outcome: Progressing   Problem: Nutrition: Goal: Risk of aspiration will decrease Outcome: Progressing   

## 2019-04-21 NOTE — Plan of Care (Signed)
  Problem: Education: Goal: Knowledge of disease or condition will improve 04/21/2019 1417 by Jenene Slicker, RN Outcome: Completed/Met 04/21/2019 1318 by Jenene Slicker, RN Outcome: Progressing Goal: Knowledge of secondary prevention will improve 04/21/2019 1417 by Jenene Slicker, RN Outcome: Completed/Met 04/21/2019 1318 by Jenene Slicker, RN Outcome: Progressing Goal: Knowledge of patient specific risk factors addressed and post discharge goals established will improve 04/21/2019 1417 by Jenene Slicker, RN Outcome: Completed/Met 04/21/2019 1318 by Jenene Slicker, RN Outcome: Progressing   Problem: Coping: Goal: Will verbalize positive feelings about self 04/21/2019 1417 by Jenene Slicker, RN Outcome: Completed/Met 04/21/2019 1318 by Jenene Slicker, RN Outcome: Progressing Goal: Will identify appropriate support needs 04/21/2019 1417 by Jenene Slicker, RN Outcome: Completed/Met 04/21/2019 1318 by Jenene Slicker, RN Outcome: Progressing   Problem: Health Behavior/Discharge Planning: Goal: Ability to manage health-related needs will improve 04/21/2019 1417 by Jenene Slicker, RN Outcome: Completed/Met 04/21/2019 1318 by Jenene Slicker, RN Outcome: Progressing   Problem: Self-Care: Goal: Ability to participate in self-care as condition permits will improve 04/21/2019 1417 by Jenene Slicker, RN Outcome: Completed/Met 04/21/2019 1318 by Jenene Slicker, RN Outcome: Progressing Goal: Verbalization of feelings and concerns over difficulty with self-care will improve 04/21/2019 1417 by Jenene Slicker, RN Outcome: Completed/Met 04/21/2019 1318 by Jenene Slicker, RN Outcome: Progressing Goal: Ability to communicate needs accurately will improve 04/21/2019 1417 by Jenene Slicker, RN Outcome: Completed/Met 04/21/2019 1318 by Jenene Slicker, RN Outcome: Progressing   Problem: Nutrition: Goal: Risk of aspiration will decrease 04/21/2019 1417 by Jenene Slicker, RN Outcome:  Completed/Met 04/21/2019 1318 by Jenene Slicker, RN Outcome: Progressing

## 2019-04-23 LAB — NOVEL CORONAVIRUS, NAA (HOSP ORDER, SEND-OUT TO REF LAB; TAT 18-24 HRS): SARS-CoV-2, NAA: NOT DETECTED

## 2019-04-23 LAB — HIV ANTIBODY (ROUTINE TESTING W REFLEX): HIV Screen 4th Generation wRfx: NONREACTIVE

## 2019-04-24 ENCOUNTER — Ambulatory Visit: Payer: Medicaid Other | Admitting: Neurology

## 2019-04-24 ENCOUNTER — Encounter: Payer: Self-pay | Admitting: Neurology

## 2019-04-24 ENCOUNTER — Other Ambulatory Visit: Payer: Self-pay

## 2019-04-24 VITALS — BP 125/86 | HR 88 | Temp 97.1°F | Ht 67.5 in | Wt 200.0 lb

## 2019-04-24 DIAGNOSIS — Z9119 Patient's noncompliance with other medical treatment and regimen: Secondary | ICD-10-CM

## 2019-04-24 DIAGNOSIS — F17200 Nicotine dependence, unspecified, uncomplicated: Secondary | ICD-10-CM | POA: Diagnosis not present

## 2019-04-24 DIAGNOSIS — G8929 Other chronic pain: Secondary | ICD-10-CM | POA: Diagnosis not present

## 2019-04-24 DIAGNOSIS — I6381 Other cerebral infarction due to occlusion or stenosis of small artery: Secondary | ICD-10-CM

## 2019-04-24 DIAGNOSIS — Z91199 Patient's noncompliance with other medical treatment and regimen due to unspecified reason: Secondary | ICD-10-CM
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
## 2019-04-24 NOTE — Patient Instructions (Addendum)
Take the Plavix (clopidogrel) for 3 weeks. Then stop. Continue Aspirin and Cholesterol medication every day. Stop smoking. Follow up with pain clinic, physical therapy and Neurosurgery or Orthopaedics.     Stroke Prevention Some medical conditions and behaviors are associated with a higher chance of having a stroke. You can help prevent a stroke by making nutrition, lifestyle, and other changes, including managing any medical conditions you may have. What nutrition changes can be made?   Eat healthy foods. You can do this by: ? Choosing foods high in fiber, such as fresh fruits and vegetables and whole grains. ? Eating at least 5 or more servings of fruits and vegetables a day. Try to fill half of your plate at each meal with fruits and vegetables. ? Choosing lean protein foods, such as lean cuts of meat, poultry without skin, fish, tofu, beans, and nuts. ? Eating low-fat dairy products. ? Avoiding foods that are high in salt (sodium). This can help lower blood pressure. ? Avoiding foods that have saturated fat, trans fat, and cholesterol. This can help prevent high cholesterol. ? Avoiding processed and premade foods.  Follow your health care provider's specific guidelines for losing weight, controlling high blood pressure (hypertension), lowering high cholesterol, and managing diabetes. These may include: ? Reducing your daily calorie intake. ? Limiting your daily sodium intake to 1,500 milligrams (mg). ? Using only healthy fats for cooking, such as olive oil, canola oil, or sunflower oil. ? Counting your daily carbohydrate intake. What lifestyle changes can be made?  Maintain a healthy weight. Talk to your health care provider about your ideal weight.  Get at least 30 minutes of moderate physical activity at least 5 days a week. Moderate activity includes brisk walking, biking, and swimming.  Do not use any products that contain nicotine or tobacco, such as cigarettes and  e-cigarettes. If you need help quitting, ask your health care provider. It may also be helpful to avoid exposure to secondhand smoke.  Limit alcohol intake to no more than 1 drink a day for nonpregnant women and 2 drinks a day for men. One drink equals 12 oz of beer, 5 oz of wine, or 1 oz of hard liquor.  Stop any illegal drug use.  Avoid taking birth control pills. Talk to your health care provider about the risks of taking birth control pills if: ? You are over 6 years old. ? You smoke. ? You get migraines. ? You have ever had a blood clot. What other changes can be made?  Manage your cholesterol levels. ? Eating a healthy diet is important for preventing high cholesterol. If cholesterol cannot be managed through diet alone, you may also need to take medicines. ? Take any prescribed medicines to control your cholesterol as told by your health care provider.  Manage your diabetes. ? Eating a healthy diet and exercising regularly are important parts of managing your blood sugar. If your blood sugar cannot be managed through diet and exercise, you may need to take medicines. ? Take any prescribed medicines to control your diabetes as told by your health care provider.  Control your hypertension. ? To reduce your risk of stroke, try to keep your blood pressure below 130/80. ? Eating a healthy diet and exercising regularly are an important part of controlling your blood pressure. If your blood pressure cannot be managed through diet and exercise, you may need to take medicines. ? Take any prescribed medicines to control hypertension as told by your health care provider. ?  Ask your health care provider if you should monitor your blood pressure at home. ? Have your blood pressure checked every year, even if your blood pressure is normal. Blood pressure increases with age and some medical conditions.  Get evaluated for sleep disorders (sleep apnea). Talk to your health care provider about  getting a sleep evaluation if you snore a lot or have excessive sleepiness.  Take over-the-counter and prescription medicines only as told by your health care provider. Aspirin or blood thinners (antiplatelets or anticoagulants) may be recommended to reduce your risk of forming blood clots that can lead to stroke.  Make sure that any other medical conditions you have, such as atrial fibrillation or atherosclerosis, are managed. What are the warning signs of a stroke? The warning signs of a stroke can be easily remembered as BEFAST.  B is for balance. Signs include: ? Dizziness. ? Loss of balance or coordination. ? Sudden trouble walking.  E is for eyes. Signs include: ? A sudden change in vision. ? Trouble seeing.  F is for face. Signs include: ? Sudden weakness or numbness of the face. ? The face or eyelid drooping to one side.  A is for arms. Signs include: ? Sudden weakness or numbness of the arm, usually on one side of the body.  S is for speech. Signs include: ? Trouble speaking (aphasia). ? Trouble understanding.  T is for time. ? These symptoms may represent a serious problem that is an emergency. Do not wait to see if the symptoms will go away. Get medical help right away. Call your local emergency services (911 in the [place] Do not drive yourself to the hospital.  Other signs of stroke may include: ? A sudden, severe headache with no known cause. ? Nausea or vomiting. ? Seizure. Where to find more information For more information, visit:  American Stroke Association: www.strokeassociation.org  National Stroke Association: www.stroke.org Summary  You can prevent a stroke by eating healthy, exercising, not smoking, limiting alcohol intake, and managing any medical conditions you may have.  Do not use any products that contain nicotine or tobacco, such as cigarettes and e-cigarettes. If you need help quitting, ask your health care provider. It may also be helpful to  avoid exposure to secondhand smoke.  Remember BEFAST for warning signs of stroke. Get help right away if you or a loved one has any of these signs. This information is not intended to replace advice given to you by your health care provider. Make sure you discuss any questions you have with your health care provider. Document Released: 11/10/2004 Document Revised: 09/15/2017 Document Reviewed: 11/08/2016 Elsevier Patient Education  2020 [employer]

## 2019-04-28 DIAGNOSIS — Z91199 Patient's noncompliance with other medical treatment and regimen due to unspecified reason: Secondary | ICD-10-CM | POA: Insufficient documentation

## 2019-04-28 DIAGNOSIS — I6381 Other cerebral infarction due to occlusion or stenosis of small artery: Secondary | ICD-10-CM | POA: Insufficient documentation

## 2019-04-28 DIAGNOSIS — F17200 Nicotine dependence, unspecified, uncomplicated: Secondary | ICD-10-CM | POA: Insufficient documentation

## 2019-04-28 DIAGNOSIS — G894 Chronic pain syndrome: Secondary | ICD-10-CM | POA: Insufficient documentation

## 2019-04-28 DIAGNOSIS — G8929 Other chronic pain: Secondary | ICD-10-CM | POA: Insufficient documentation

## 2019-04-28 DIAGNOSIS — Z9119 Patient's noncompliance with other medical treatment and regimen: Secondary | ICD-10-CM | POA: Insufficient documentation

## 2019-04-30 ENCOUNTER — Encounter: Payer: Self-pay | Admitting: Adult Health

## 2019-04-30 ENCOUNTER — Ambulatory Visit: Payer: Medicaid Other | Admitting: Adult Health

## 2019-04-30 ENCOUNTER — Other Ambulatory Visit: Payer: Self-pay

## 2019-04-30 VITALS — BP 123/83 | HR 82 | Resp 16 | Ht 67.5 in | Wt 204.0 lb

## 2019-04-30 DIAGNOSIS — R0683 Snoring: Secondary | ICD-10-CM | POA: Diagnosis not present

## 2019-04-30 DIAGNOSIS — J449 Chronic obstructive pulmonary disease, unspecified: Secondary | ICD-10-CM

## 2019-04-30 DIAGNOSIS — I10 Essential (primary) hypertension: Secondary | ICD-10-CM

## 2019-04-30 DIAGNOSIS — F17219 Nicotine dependence, cigarettes, with unspecified nicotine-induced disorders: Secondary | ICD-10-CM | POA: Diagnosis not present

## 2019-04-30 MED ORDER — BUDESONIDE-FORMOTEROL FUMARATE 160-4.5 MCG/ACT IN AERO: 2.0000 | INHALATION_SPRAY | Freq: Two times a day (BID) | RESPIRATORY_TRACT | 3 refills | Status: DC

## 2019-04-30 NOTE — Progress Notes (Signed)
[facility] [place], [place] [address]  Internal MEDICINE  Office Visit Note  Patient Name: Traci Cross  [id number]  [id number]  Date of Service: 04/30/2019     Chief Complaint  Patient presents with  . Hospitalization Follow-up    had a stroke first week in July   . Sleep Apnea    need a cpap , patinet states that she stops breathing at night   . Quality Metric Gaps    colonoscopy , pap smear     HPI Pt is here for recent hospital follow up. Pt reports on 04/19/2019 she work up around 5am needing her oxygen.  She put her oxygen back on and went back to sleep.  Then at 9am she woke up and realized her left side was not feeling right, and was having difficulty walking.  About 3 hours later, she went to the ED. She stayed for a few nights and was discharged.  She did have an MRI that showed an acute white matter disease. While in the hospital she was told she had some significant apnea overnight and was encouraged to be evaluated for sleep apnea.   Current Medication: Outpatient Encounter Medications as of 04/30/2019  Medication Sig Note  . acetaminophen (TYLENOL) 500 MG tablet Take 1,000 mg by mouth as needed.   Marland Kitchen albuterol (VENTOLIN HFA) 108 (90 Base) MCG/ACT inhaler Inhale 2 puffs into the lungs every 6 (six) hours as needed for wheezing or shortness of breath.   Marland Kitchen aspirin EC 81 MG tablet Take 1 tablet (81 mg total) by mouth daily.   Marland Kitchen atorvastatin (LIPITOR) 40 MG tablet Take 1 tablet (40 mg total) by mouth daily at 6 PM.   . budesonide-formoterol (SYMBICORT) 160-4.5 MCG/ACT inhaler Inhale 2 puffs into the lungs 2 (two) times daily.   . clopidogrel (PLAVIX) 75 MG tablet Take 1 tablet (75 mg total) by mouth daily for 21 days.   . hydrochlorothiazide (HYDRODIURIL) 25 MG tablet Take 1 tablet (25 mg total) by mouth daily.   . [DISCONTINUED] Albuterol Sulfate (PROAIR HFA IN) Inhale 2 puffs into the lungs as needed.   . [DISCONTINUED] nicotine (NICODERM CQ -  DOSED IN MG/24 HOURS) 21 mg/24hr patch Place 1 patch (21 mg total) onto the skin daily. (Patient not taking: Reported on 04/30/2019) 04/24/2019: Has not started yet    No facility-administered encounter medications on file as of 04/30/2019.     Surgical History: Past Surgical History:  Procedure Laterality Date  . CHOLECYSTECTOMY    . TONSILLECTOMY      Medical History: Past Medical History:  Diagnosis Date  . Asthma   . COPD (chronic obstructive pulmonary disease) ([place])   . CVA (cerebral vascular accident) ([place]) 04/19/2019  . Depression   . Hypertension   . Sleep apnea   . Tobacco abuse     Family History: Family History  Problem Relation Age of Onset  . Heart failure Mother   . Stroke Mother   . Alzheimer's disease Mother   . Heart failure Father   . Stroke Father   . Dementia Father     Social History   Socioeconomic History  . Marital status: Single    Spouse name: Not on file  . Number of children: Not on file  . Years of education: Not on file  . Highest education level: Not on file  Occupational History  . Not on file  Social Needs  . Financial resource strain: Not on file  .  Food insecurity    Worry: Not on file    Inability: Not on file  . Transportation needs    Medical: Not on file    Non-medical: Not on file  Tobacco Use  . Smoking status: Current Every Day Smoker    Packs/day: 0.25    Years: 35.00    Pack years: 8.75    Types: Cigarettes  . Smokeless tobacco: Never Used  Substance and Sexual Activity  . Alcohol use: No    Alcohol/week: 0.0 standard drinks  . Drug use: No  . Sexual activity: Not on file  Lifestyle  . Physical activity    Days per week: Not on file    Minutes per session: Not on file  . Stress: Not on file  Relationships  . Social Musicianconnections    Talks on phone: Not on file    Gets together: Not on file    Attends religious service: Not on file    Active member of club or organization: Not on file    Attends meetings  of clubs or organizations: Not on file    Relationship status: Not on file  . Intimate partner violence    Fear of current or ex partner: Not on file    Emotionally abused: Not on file    Physically abused: Not on file    Forced sexual activity: Not on file  Other Topics Concern  . Not on file  Social History Narrative   Lives at home alone   Right handed   Caffeine: up to a 2L/day of soda       Review of Systems  Constitutional: Negative for chills, fatigue and unexpected weight change.  HENT: Negative for congestion, rhinorrhea, sneezing and sore throat.   Eyes: Negative for photophobia, pain and redness.  Respiratory: Negative for cough, chest tightness and shortness of breath.   Cardiovascular: Negative for chest pain and palpitations.  Gastrointestinal: Negative for abdominal pain, constipation, diarrhea, nausea and vomiting.  Endocrine: Negative.   Genitourinary: Negative for dysuria and frequency.  Musculoskeletal: Negative for arthralgias, back pain, joint swelling and neck pain.  Skin: Negative for rash.  Allergic/Immunologic: Negative.   Neurological: Negative for tremors and numbness.  Hematological: Negative for adenopathy. Does not bruise/bleed easily.  Psychiatric/Behavioral: Negative for behavioral problems and sleep disturbance. The patient is not nervous/anxious.     Vital Signs: BP 123/83   Pulse 82   Resp 16   Ht 5' 7.5" (1.715 m)   Wt 204 lb (92.5 kg)   SpO2 95%   BMI 31.48 kg/m    Physical Exam Vitals signs and nursing note reviewed.  Constitutional:      General: She is not in acute distress.    Appearance: She is well-developed. She is not diaphoretic.  HENT:     Head: Normocephalic and atraumatic.     Mouth/Throat:     Pharynx: No oropharyngeal exudate.  Eyes:     Pupils: Pupils are equal, round, and reactive to light.  Neck:     Musculoskeletal: Normal range of motion and neck supple.     Thyroid: No thyromegaly.     Vascular: No  JVD.     Trachea: No tracheal deviation.  Cardiovascular:     Rate and Rhythm: Normal rate and regular rhythm.     Heart sounds: Normal heart sounds. No murmur. No friction rub. No gallop.   Pulmonary:     Effort: Pulmonary effort is normal. No respiratory distress.  Breath sounds: Normal breath sounds. No wheezing or rales.  Chest:     Chest wall: No tenderness.  Abdominal:     Palpations: Abdomen is soft.     Tenderness: There is no abdominal tenderness. There is no guarding.  Musculoskeletal: Normal range of motion.  Lymphadenopathy:     Cervical: No cervical adenopathy.  Skin:    General: Skin is warm and dry.  Neurological:     Mental Status: She is alert and oriented to person, place, and time.     Cranial Nerves: No cranial nerve deficit.  Psychiatric:        Behavior: Behavior normal.        Thought Content: Thought content normal.        Judgment: Judgment normal.    Assessment/Plan: 1. Chronic obstructive pulmonary disease, unspecified COPD type (HCC) Continue to use inhaler as directed.   - budesonide-formoterol (SYMBICORT) 160-4.5 MCG/ACT inhaler; Inhale 2 puffs into the lungs 2 (two) times daily.  Dispense: 1 Inhaler; Refill: 3  2. Essential hypertension Stable, continue present management.   3. Loud snoring Will order patient home sleep study to evaluate for apnea,.  She was told she stopped breathing at night while hospitalized.  - Home sleep test  4. Cigarette nicotine dependence with nicotine-induced disorder Smoking cessation counseling: 1. Pt acknowledges the risks of long term smoking, she will try to quite smoking. 2. Options for different medications including nicotine products, chewing gum, patch etc, Wellbutrin and Chantix is discussed 3. Goal and date of compete cessation is discussed 4. Total time spent in smoking cessation is 15 min.   General Counseling: Leigh Aurorallen verbalizes understanding of the findings of todays visit and agrees with plan of  treatment. I have discussed any further diagnostic evaluation that may be needed or ordered today. We also reviewed her medications today. she has been encouraged to call the office with any questions or concerns that should arise related to todays visit.    No orders of the defined types were placed in this encounter.     I have reviewed all medical records from hospital follow up including radiology reports and consults from other physicians. Appropriate follow up diagnostics will be scheduled as needed. Patient/ Family understands the plan of treatment.   Time spent 30 minutes.   Blima LedgerAdam Katja Blue AGNP-C Internal Medicine

## 2019-05-07 ENCOUNTER — Other Ambulatory Visit: Payer: Medicaid Other | Admitting: Internal Medicine

## 2019-05-08 ENCOUNTER — Ambulatory Visit (INDEPENDENT_AMBULATORY_CARE_PROVIDER_SITE_OTHER): Payer: Medicaid Other | Admitting: Internal Medicine

## 2019-05-08 DIAGNOSIS — G4710 Hypersomnia, unspecified: Secondary | ICD-10-CM | POA: Diagnosis not present

## 2019-05-09 ENCOUNTER — Other Ambulatory Visit: Payer: Self-pay

## 2019-05-13 ENCOUNTER — Telehealth: Payer: Self-pay

## 2019-05-13 NOTE — Telephone Encounter (Signed)
FAXED REQUESTED MEDICAL RECORDS FOR PATIENT TO [facility].

## 2019-05-16 ENCOUNTER — Encounter: Payer: Self-pay | Admitting: Internal Medicine

## 2019-05-16 ENCOUNTER — Ambulatory Visit: Payer: Medicaid Other | Admitting: Internal Medicine

## 2019-05-16 ENCOUNTER — Other Ambulatory Visit: Payer: Self-pay

## 2019-05-16 VITALS — BP 135/86 | HR 86 | Resp 16 | Ht 67.0 in | Wt 202.0 lb

## 2019-05-16 DIAGNOSIS — R0602 Shortness of breath: Secondary | ICD-10-CM

## 2019-05-16 DIAGNOSIS — F17219 Nicotine dependence, cigarettes, with unspecified nicotine-induced disorders: Secondary | ICD-10-CM

## 2019-05-16 DIAGNOSIS — I10 Essential (primary) hypertension: Secondary | ICD-10-CM

## 2019-05-16 DIAGNOSIS — J449 Chronic obstructive pulmonary disease, unspecified: Secondary | ICD-10-CM

## 2019-05-16 NOTE — Progress Notes (Signed)
[facility] [address] [place], [place] [address]  Pulmonary Sleep Medicine   Office Visit Note  Patient Name: Traci Cross DOB: 04-10-1961 MRN [id number]  Date of Service: 05/16/2019  Complaints/HPI: PT was seen for follow up on sleep study.  We ordered the sleep study because while she was in the hospital and was told she should be evaluated for sleep apnea.  Her sleep study shows and ahi index of 1.9 will 8 total apeneas and hypopneas. She did not have an desaturation, and patient reports she was wearing her normal oxygen during the study.    ROS  General: (-) fever, (-) chills, (-) night sweats, (-) weakness Skin: (-) rashes, (-) itching,. Eyes: (-) visual changes, (-) redness, (-) itching. Nose and Sinuses: (-) nasal stuffiness or itchiness, (-) postnasal drip, (-) nosebleeds, (-) sinus trouble. Mouth and Throat: (-) sore throat, (-) hoarseness. Neck: (-) swollen glands, (-) enlarged thyroid, (-) neck pain. Respiratory: - cough, (-) bloody sputum, - shortness of breath, - wheezing. Cardiovascular: - ankle swelling, (-) chest pain. Lymphatic: (-) lymph node enlargement. Neurologic: (-) numbness, (-) tingling. Psychiatric: (-) anxiety, (-) depression   Current Medication: Outpatient Encounter Medications as of 05/16/2019  Medication Sig  . acetaminophen (TYLENOL) 500 MG tablet Take 1,000 mg by mouth as needed.  Marland Kitchen albuterol (VENTOLIN HFA) 108 (90 Base) MCG/ACT inhaler Inhale 2 puffs into the lungs every 6 (six) hours as needed for wheezing or shortness of breath.  Marland Kitchen aspirin EC 81 MG tablet Take 1 tablet (81 mg total) by mouth daily.  Marland Kitchen atorvastatin (LIPITOR) 40 MG tablet Take 1 tablet (40 mg total) by mouth daily at 6 PM.  . budesonide-formoterol (SYMBICORT) 160-4.5 MCG/ACT inhaler Inhale 2 puffs into the lungs 2 (two) times daily.  . hydrochlorothiazide (HYDRODIURIL) 25 MG tablet Take 1 tablet (25 mg total) by mouth daily.  . OXYGEN Inhale into the lungs.  Oxygen at night 2 l   No facility-administered encounter medications on file as of 05/16/2019.     Surgical History: Past Surgical History:  Procedure Laterality Date  . CHOLECYSTECTOMY    . TONSILLECTOMY      Medical History: Past Medical History:  Diagnosis Date  . Asthma   . COPD (chronic obstructive pulmonary disease) (HCC)   . CVA (cerebral vascular accident) (HCC) 04/19/2019  . Depression   . Hypertension   . Sleep apnea   . Tobacco abuse     Family History: Family History  Problem Relation Age of Onset  . Heart failure Mother   . Stroke Mother   . Alzheimer's disease Mother   . Heart failure Father   . Stroke Father   . Dementia Father     Social History: Social History   Socioeconomic History  . Marital status: Single    Spouse name: Not on file  . Number of children: Not on file  . Years of education: Not on file  . Highest education level: Not on file  Occupational History  . Not on file  Social Needs  . Financial resource strain: Not on file  . Food insecurity    Worry: Not on file    Inability: Not on file  . Transportation needs    Medical: Not on file    Non-medical: Not on file  Tobacco Use  . Smoking status: Current Every Day Smoker    Packs/day: 0.25    Years: 35.00    Pack years: 8.75    Types: Cigarettes  .  Smokeless tobacco: Never Used  Substance and Sexual Activity  . Alcohol use: No    Alcohol/week: 0.0 standard drinks  . Drug use: No  . Sexual activity: Not on file  Lifestyle  . Physical activity    Days per week: Not on file    Minutes per session: Not on file  . Stress: Not on file  Relationships  . Social Musician on phone: Not on file    Gets together: Not on file    Attends religious service: Not on file    Active member of club or organization: Not on file    Attends meetings of clubs or organizations: Not on file    Relationship status: Not on file  . Intimate partner violence    Fear of current  or ex partner: Not on file    Emotionally abused: Not on file    Physically abused: Not on file    Forced sexual activity: Not on file  Other Topics Concern  . Not on file  Social History Narrative   Lives at home alone   Right handed   Caffeine: up to a 2L/day of soda     Vital Signs: Blood pressure 135/86, pulse 86, resp. rate 16, height 5\' 7"  (1.702 m), weight 202 lb (91.6 kg), SpO2 96 %.  Examination: General Appearance: The patient is well-developed, well-nourished, and in no distress. Skin: Gross inspection of skin unremarkable. Head: normocephalic, no gross deformities. Eyes: no gross deformities noted. ENT: ears appear grossly normal no exudates. Neck: Supple. No thyromegaly. No LAD. Respiratory: clear bilaterally. Cardiovascular: Normal S1 and S2 without murmur or rub. Extremities: No cyanosis. pulses are equal. Neurologic: Alert and oriented. No involuntary movements.  LABS: Recent Results (from the past 2160 hour(s))  Glucose, capillary     Status: Abnormal   Collection Time: 04/19/19  1:00 PM  Result Value Ref Range   Glucose-Capillary 103 (H) 70 - 99 mg/dL  Protime-INR     Status: None   Collection Time: 04/19/19  1:09 PM  Result Value Ref Range   Prothrombin Time 12.1 11.4 - 15.2 seconds   INR 0.9 0.8 - 1.2    Comment: (NOTE) INR goal varies based on device and disease states. Performed at [facility] [address] [place], [place] [address]   APTT     Status: None   Collection Time: 04/19/19  1:09 PM  Result Value Ref Range   aPTT 31 24 - 36 seconds    Comment: Performed at [facility], [address] [place], [place] [address]  CBC     Status: None   Collection Time: 04/19/19  1:09 PM  Result Value Ref Range   WBC 8.0 4.0 - 10.5 K/uL   RBC 4.89 3.87 - 5.11 MIL/uL   Hemoglobin 14.4 12.0 - 15.0 g/dL   HCT 82.9 56.2 - 13.0 %   MCV 87.3 80.0 - 100.0 fL   MCH 29.4 26.0 - 34.0 pg   MCHC 33.7 30.0 - 36.0 g/dL   RDW 86.5 78.4  - 69.6 %   Platelets 195 150 - 400 K/uL   nRBC 0.0 0.0 - 0.2 %    Comment: Performed at [facility], [address] [place], [place] [address]  Differential     Status: None   Collection Time: 04/19/19  1:09 PM  Result Value Ref Range   Neutrophils Relative % 61 %   Neutro Abs 4.9 1.7 - 7.7 K/uL  Lymphocytes Relative 26 %   Lymphs Abs 2.1 0.7 - 4.0 K/uL   Monocytes Relative 8 %   Monocytes Absolute 0.7 0.1 - 1.0 K/uL   Eosinophils Relative 4 %   Eosinophils Absolute 0.3 0.0 - 0.5 K/uL   Basophils Relative 1 %   Basophils Absolute 0.1 0.0 - 0.1 K/uL   Immature Granulocytes 0 %   Abs Immature Granulocytes 0.01 0.00 - 0.07 K/uL    Comment: Performed at [facility], [address] [place], [place] [address]  Comprehensive metabolic panel     Status: Abnormal   Collection Time: 04/19/19  1:09 PM  Result Value Ref Range   Sodium 141 135 - 145 mmol/L   Potassium 3.3 (L) 3.5 - 5.1 mmol/L   Chloride 108 98 - 111 mmol/L   CO2 24 22 - 32 mmol/L   Glucose, Bld 99 70 - 99 mg/dL   BUN 14 6 - 20 mg/dL   Creatinine, Ser 0.76 0.44 - 1.00 mg/dL   Calcium 8.3 (L) 8.9 - 10.3 mg/dL   Total Protein 6.5 6.5 - 8.1 g/dL   Albumin 3.7 3.5 - 5.0 g/dL   AST 19 15 - 41 U/L   ALT 15 0 - 44 U/L   Alkaline Phosphatase 71 38 - 126 U/L   Total Bilirubin 0.4 0.3 - 1.2 mg/dL   GFR calc non Af Amer >60 >60 mL/min   GFR calc Af Amer >60 >60 mL/min   Anion gap 9 5 - 15    Comment: Performed at [facility], [address] [place], [place] [address]  Novel Coronavirus,NAA,(SEND-OUT TO REF LAB - TAT 24-48 hrs); Hosp Order     Status: None   Collection Time: 04/19/19 10:19 PM   Specimen: Nasopharyngeal Swab; Respiratory  Result Value Ref Range   SARS-CoV-2, NAA NOT DETECTED NOT DETECTED    Comment: (NOTE) This test was developed and its performance characteristics determined by [employer]. This test has not been FDA cleared or approved. This test has been  authorized by FDA under an Emergency Use Authorization (EUA). This test is only authorized for the duration of time the declaration that circumstances exist justifying the authorization of the emergency use of in vitro diagnostic tests for detection of SARS-CoV-2 virus and/or diagnosis of COVID-19 infection under section 564(b)(1) of the Act, 21 U.S.C. 062IRS-8(N)(4), unless the authorization is terminated or revoked sooner. When diagnostic testing is negative, the possibility of a false negative result should be considered in the context of a patient's recent exposures and the presence of clinical signs and symptoms consistent with COVID-19. An individual without symptoms of COVID-19 and who is not shedding SARS-CoV-2 virus would expect to have a negative (not detected) result in this assay. Performed  At: [facility] [address] [place], [place] [id number] Rush Farmer MD FG:[id number]    Coronavirus Source NASOPHARYNGEAL     Comment: Performed at [facility], [address] [place], [place] [address]  HIV antibody (Routine Testing)     Status: None   Collection Time: 04/20/19  5:30 AM  Result Value Ref Range   HIV Screen 4th Generation wRfx Non Reactive Non Reactive    Comment: (NOTE) Performed At: [facility] [place], [place] [id number] Rush Farmer MD [id number]   Hemoglobin A1c     Status: None   Collection Time: 04/20/19  5:30 AM  Result Value Ref Range   Hgb A1c MFr Bld 5.4 4.8 - 5.6 %  Comment: (NOTE) Pre diabetes:          5.7%-6.4% Diabetes:              >6.4% Glycemic control for   <7.0% adults with diabetes    Mean Plasma Glucose 108.28 mg/dL    Comment: Performed at [facility], [address] [place], [place] [address]  Lipid panel     Status: Abnormal   Collection Time: 04/20/19  5:30 AM  Result Value Ref Range   Cholesterol 151 0 - 200 mg/dL   Triglycerides 604265 (H) <150 mg/dL   HDL 33 (L)  >54>40 mg/dL   Total CHOL/HDL Ratio 4.6 RATIO   VLDL 53 (H) 0 - 40 mg/dL   LDL Cholesterol 65 0 - 99 mg/dL    Comment:        Total Cholesterol/HDL:CHD Risk Coronary Heart Disease Risk Table                     Men   Women  1/2 Average Risk   3.4   3.3  Average Risk       5.0   4.4  2 X Average Risk   9.6   7.1  3 X Average Risk  23.4   11.0        Use the calculated Patient Ratio above and the CHD Risk Table to determine the patient's CHD Risk.        ATP III CLASSIFICATION (LDL):  <100     mg/dL   Optimal  098-119100-129  mg/dL   Near or Above                    Optimal  130-159  mg/dL   Borderline  147-829160-189  mg/dL   High  >562>190     mg/dL   Very High Performed at [facility], [address] [place], [place] [address]   Basic metabolic panel     Status: Abnormal   Collection Time: 04/20/19  5:30 AM  Result Value Ref Range   Sodium 140 135 - 145 mmol/L   Potassium 3.3 (L) 3.5 - 5.1 mmol/L   Chloride 108 98 - 111 mmol/L   CO2 25 22 - 32 mmol/L   Glucose, Bld 103 (H) 70 - 99 mg/dL   BUN 16 6 - 20 mg/dL   Creatinine, Ser 5.780.70 0.44 - 1.00 mg/dL   Calcium 8.1 (L) 8.9 - 10.3 mg/dL   GFR calc non Af Amer >60 >60 mL/min   GFR calc Af Amer >60 >60 mL/min   Anion gap 7 5 - 15    Comment: Performed at [facility], [address] [place], [place] [address]  Magnesium     Status: None   Collection Time: 04/20/19  5:30 AM  Result Value Ref Range   Magnesium 2.3 1.7 - 2.4 mg/dL    Comment: Performed at [facility], [address] [place], [place] [address]  ECHOCARDIOGRAM COMPLETE     Status: None   Collection Time: 04/21/19 12:18 PM  Result Value Ref Range   Weight 3,321.6 oz   Height 67.5 in   BP 145/94 mmHg    Radiology: No results found.  No results found.  Ct Head Wo Contrast  Result Date: 04/19/2019 CLINICAL DATA:  Patient with shortness of breath. Left-sided weakness. EXAM: CT HEAD WITHOUT CONTRAST TECHNIQUE: Contiguous axial images were  obtained from the base of the skull through the vertex without  intravenous contrast. COMPARISON:  None. FINDINGS: Brain: Ventricles and sulci are appropriate for patient's age. No evidence for acute cortically based infarct, intracranial hemorrhage, mass lesion or mass-effect. Vascular: Unremarkable Skull: Intact. Sinuses/Orbits: Paranasal sinuses are well aerated. Mastoid air cells are unremarkable. Orbits are unremarkable. Other: None. IMPRESSION: No acute intracranial process. Electronically Signed   By: Annia Belt M.D.   On: 04/19/2019 13:51   Mr Laqueta Jean ZO Contrast  Result Date: 04/19/2019 CLINICAL DATA:  58 year old female with left side weakness, shortness of breath. EXAM: MRI HEAD WITHOUT AND WITH CONTRAST MRI CERVICAL SPINE WITHOUT CONTRAST TECHNIQUE: Multiplanar, multiecho pulse sequences of the brain were obtained without and with intravenous contrast. Multiplanar multisequence noncontrast imaging of the cervical spine. CONTRAST:  9 milliliters Gadavist. COMPARISON:  Cervical spine MRI 03/21/2019. Head CT 1327 hours today and earlier. FINDINGS: MRI HEAD FINDINGS Brain: There is a linear 10 millimeter area of restricted diffusion in the posterior right corona radiata tracking to the subcortical white matter of the motor strip (series 5, image 40 and series 7, image 17. No associated hemorrhage or mass effect. Underlying Patchy and confluent bilateral cerebral white matter T2 and FLAIR hyperintensity, especially in the periatrial regions. No other restricted diffusion. No midline shift, mass effect, evidence of mass lesion, ventriculomegaly, extra-axial collection or acute intracranial hemorrhage. Cervicomedullary junction and pituitary are within normal limits. Possible chronic microhemorrhage at the left parieto-occipital sulcus on series 11, image 16. No cortical encephalomalacia identified. Negative deep gray matter nuclei, brainstem and cerebellum. No abnormal enhancement identified.  No dural  thickening. Vascular: Major intracranial vascular flow voids are preserved. The major dural venous sinuses appear to be enhancing and patent. Skull and upper cervical spine: Hyperostosis of the calvarium. Normal bone marrow signal at the skull base. Cervical spine described below. Sinuses/Orbits: Negative orbits.  Paranasal sinuses are clear. Other: Mastoids are clear. Grossly normal visible internal auditory structures. Scalp and face soft tissues appear negative. MRI CERVICAL SPINE FINDINGS Alignment: Improved cervical lordosis compared to 03/21/2019. No spondylolisthesis. Vertebrae: No marrow edema or evidence of acute osseous abnormality. Cord: Spinal cord signal is within normal limits at all visualized levels. Posterior Fossa, vertebral arteries, paraspinal tissues: Cervicomedullary junction is within normal limits. Preserved major vascular flow voids in the neck. Negative neck soft tissues and visible lung apices. Disc levels: Stable since 03/21/2019 and notable for: C4-C5: Circumferential disc bulge with endplate spurring and mild facet hypertrophy. Mild spinal stenosis and spinal cord mass effect. Moderate left and mild right C5 foraminal stenosis. C5-C6: Disc space loss with circumferential disc osteophyte complex eccentric to the left. Borderline to mild spinal stenosis, no cord mass effect. Moderate to severe left and mild-to-moderate right C6 foraminal stenosis. IMPRESSION: 1. 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 No associated hemorrhage or mass effect. 2. Moderate underlying cerebral white matter disease, most commonly due to chronic small vessel ischemia. 3. Stable MRI appearance of the cervical spine since June: - C4-C5 mild spinal stenosis and mild spinal cord mass effect but no cord signal abnormality. Up to moderate left neural foraminal stenosis. - C5-C6 moderate to severe left greater than right neural foraminal stenosis.  Electronically Signed   By: Odessa Fleming M.D.   On: 04/19/2019 19:31   Mr Cervical Spine Wo Contrast  Result Date: 04/19/2019 CLINICAL DATA:  58 year old female with left side weakness, shortness of breath. EXAM: MRI HEAD WITHOUT AND WITH CONTRAST MRI CERVICAL SPINE WITHOUT CONTRAST TECHNIQUE: Multiplanar, multiecho  pulse sequences of the brain were obtained without and with intravenous contrast. Multiplanar multisequence noncontrast imaging of the cervical spine. CONTRAST:  9 milliliters Gadavist. COMPARISON:  Cervical spine MRI 03/21/2019. Head CT 1327 hours today and earlier. FINDINGS: MRI HEAD FINDINGS Brain: There is a linear 10 millimeter area of restricted diffusion in the posterior right corona radiata tracking to the subcortical white matter of the motor strip (series 5, image 40 and series 7, image 17. No associated hemorrhage or mass effect. Underlying Patchy and confluent bilateral cerebral white matter T2 and FLAIR hyperintensity, especially in the periatrial regions. No other restricted diffusion. No midline shift, mass effect, evidence of mass lesion, ventriculomegaly, extra-axial collection or acute intracranial hemorrhage. Cervicomedullary junction and pituitary are within normal limits. Possible chronic microhemorrhage at the left parieto-occipital sulcus on series 11, image 16. No cortical encephalomalacia identified. Negative deep gray matter nuclei, brainstem and cerebellum. No abnormal enhancement identified.  No dural thickening. Vascular: Major intracranial vascular flow voids are preserved. The major dural venous sinuses appear to be enhancing and patent. Skull and upper cervical spine: Hyperostosis of the calvarium. Normal bone marrow signal at the skull base. Cervical spine described below. Sinuses/Orbits: Negative orbits.  Paranasal sinuses are clear. Other: Mastoids are clear. Grossly normal visible internal auditory structures. Scalp and face soft tissues appear negative. MRI CERVICAL  SPINE FINDINGS Alignment: Improved cervical lordosis compared to 03/21/2019. No spondylolisthesis. Vertebrae: No marrow edema or evidence of acute osseous abnormality. Cord: Spinal cord signal is within normal limits at all visualized levels. Posterior Fossa, vertebral arteries, paraspinal tissues: Cervicomedullary junction is within normal limits. Preserved major vascular flow voids in the neck. Negative neck soft tissues and visible lung apices. Disc levels: Stable since 03/21/2019 and notable for: C4-C5: Circumferential disc bulge with endplate spurring and mild facet hypertrophy. Mild spinal stenosis and spinal cord mass effect. Moderate left and mild right C5 foraminal stenosis. C5-C6: Disc space loss with circumferential disc osteophyte complex eccentric to the left. Borderline to mild spinal stenosis, no cord mass effect. Moderate to severe left and mild-to-moderate right C6 foraminal stenosis. IMPRESSION: 1. 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 No associated hemorrhage or mass effect. 2. Moderate underlying cerebral white matter disease, most commonly due to chronic small vessel ischemia. 3. Stable MRI appearance of the cervical spine since June: - C4-C5 mild spinal stenosis and mild spinal cord mass effect but no cord signal abnormality. Up to moderate left neural foraminal stenosis. - C5-C6 moderate to severe left greater than right neural foraminal stenosis. Electronically Signed   By: Odessa FlemingH  Hall M.D.   On: 04/19/2019 19:31   [place] Carotid Bilateral (at [facility] Only)  Result Date: 04/20/2019 CLINICAL DATA:  Hypertension, stroke, tobacco use EXAM: BILATERAL CAROTID DUPLEX ULTRASOUND TECHNIQUE: Wallace CullensGray scale imaging, color Doppler and duplex ultrasound were performed of bilateral carotid and vertebral arteries in the neck. COMPARISON:  None. FINDINGS: Criteria: Quantification of carotid stenosis is based on velocity parameters that correlate  the residual internal carotid diameter with NASCET-based stenosis levels, using the diameter of the distal internal carotid lumen as the denominator for stenosis measurement. The following velocity measurements were obtained: RIGHT ICA: 45/16 cm/sec CCA: 55/21 cm/sec SYSTOLIC ICA/CCA RATIO:  0.8 ECA: 93 cm/sec LEFT ICA: 85/33 cm/sec CCA: 69/14 cm/sec SYSTOLIC ICA/CCA RATIO:  1.2 ECA: 50 cm/sec RIGHT CAROTID ARTERY: Minor echogenic shadowing plaque formation. No hemodynamically significant right ICA stenosis, velocity elevation, or turbulent flow. Degree of  narrowing less than 50%. RIGHT VERTEBRAL ARTERY:  Antegrade LEFT CAROTID ARTERY: Similar scattered minor echogenic plaque formation. No hemodynamically significant left ICA stenosis, velocity elevation, or turbulent flow. LEFT VERTEBRAL ARTERY:  Antegrade IMPRESSION: Minor carotid atherosclerosis. No hemodynamically significant ICA stenosis. Degree of narrowing less than 50% bilaterally by ultrasound criteria. Patent antegrade vertebral flow bilaterally Electronically Signed   By: Judie PetitM.  Shick M.D.   On: 04/20/2019 09:36      Assessment and Plan: Patient Active Problem List   Diagnosis Date Noted  . Lacunar stroke (HCC) 04/28/2019  . Medically noncompliant 04/28/2019  . Current smoker 04/28/2019  . Other chronic pain 04/28/2019  . Acute CVA (cerebrovascular accident) (HCC) 04/19/2019  . Bilateral carpal tunnel syndrome 12/08/2016  . Goiter 08/12/2015  . COPD (chronic obstructive pulmonary disease) (HCC) 04/15/2015  . Asthma 04/15/2015  . Accelerated hypertension 03/26/2015  . Essential hypertension 03/26/2015  . Tobacco abuse 03/25/2015  . COPD exacerbation (HCC) 03/25/2015  . Acute respiratory failure (HCC) 03/25/2015    1. Chronic obstructive pulmonary disease, unspecified COPD type (HCC) Continue to use inhalers and oxygen as prescribed.   2. Essential hypertension Stable, continue current regimen.   3. Cigarette nicotine dependence  with nicotine-induced disorder Smoking cessation counseling: 1. Pt acknowledges the risks of long term smoking, she will try to quite smoking. 2. Options for different medications including nicotine products, chewing gum, patch etc, Wellbutrin and Chantix is discussed 3. Goal and date of compete cessation is discussed 4. Total time spent in smoking cessation is 15 min.   4. SOB (shortness of breath) - Spirometry with Graph  General Counseling: I have discussed the findings of the evaluation and examination with Alvino ChapelEllen.  I have also discussed any further diagnostic evaluation thatmay be needed or ordered today. Alvino Chapelllen verbalizes understanding of the findings of todays visit. We also reviewed her medications today and discussed drug interactions and side effects including but not limited excessive drowsiness and altered mental states. We also discussed that there is always a risk not just to her but also people around her. she has been encouraged to call the office with any questions or concerns that should arise related to todays visit.    Time spent: 15 This patient was seen by Blima LedgerAdam Grayton Lobo AGNP-C in Collaboration with Dr. Freda MunroSaadat Khan as a part of collaborative care agreement.   I have personally obtained a history, examined the patient, evaluated laboratory and imaging results, formulated the assessment and plan and placed orders.    Yevonne PaxSaadat A Khan, MD [facility] Pulmonary and Critical Care Sleep medicine

## 2019-05-19 NOTE — Procedures (Signed)
[facility] [address] [place], [place] [address]  Patient Name: Traci Cross DOB: 04/02/1961   SLEEP STUDY INTERPRETATION  DATE OF SERVICE: 05/08/2019   SLEEP STUDY HISTORY: This patient is referred to the sleep lab for a baseline Polysomnography. Pertinent history includes a history of diagnosis of excessive daytime somnolence and snoring.  PROCEDURE: This overnight polysomnogram was performed using the Alice 5 acquisition system using the standard diagnostic protocol as outlined by the AASM. This includes 6 channels of EEG, 2 channelscannels of EOG, chin EMG, bilateral anterior tibialis EMG, nasal/oral thermister, PTAF, chest and abdominal wall movements, ECG and pulse oximetry. Apneas and Hypopneas were scored per AASM definition.  SLEEP ARCHITECHTURE: This is a baseline polysomnograph  study. The total recording time was 319.5 minutes and the patients total sleep time is noted to be 250.0 minutes. Sleep onset latency was 30.4 minutes and is prolonged.  Stage R sleep onset latency was 75.5 minutes. Sleep maintenance efficiency was 79.4 % and is decreased.  Sleep staging expressed as a percentage of total sleep time demonstrated 10.8 % N1, 21.8 % N2 and 45.2 % N3  sleep. Stage R represents 22.2 % of total sleep time. This is decreased.  There were a total of 24 arousals  for an overall arousal index of 5.2 per hour of sleep. PLMS arousal were not noted. Arousals without respiratory events are  noted. This can contribute to sleep architechture disruption.  RESPIRATORY MONITORING:   Patient exhibits insignificant evidence of sleep disorderd breathing characterized by 0 central apneas, 0 obstructive apneas and 0 mixed apneas. There were 8 obstructive hypopneas and 16 RERAs. Most of the apneas/hypopneas were of obstructive variety. The total apnea hypopnea index (apneas and hypopneas per hour of sleep) is 1.9 respiratory events per hour and is within normal  limits.  Respiratory monitoring demonstrated moderate snoring through the night. There are a total of 296 snoring episodes representing 18.5 % of sleep.   Baseline oxygen saturation during wakefulness was 94 % and during NREM sleep averaged 94 % through the night. Arterial saturation during REM sleep was 95 % through the night. There is no significant  oxygen desaturation with the respiratory events. Arterial oxygen desaturation occurred of at least 4% was noted with a low saturation of 92 %. The study was performed off oxygen.  CARDIAC MONITORING:   Average heart rate is 65 during sleep with a high of 89 beats per minute. Malignant arrhythmias were not noted.    IMPRESSIONS:  --This overnight polysomnogram demonstrates insignificant sleep apnea with an overall AHI 1.9/h per hour. --The overall AHI was somewhat worse  during Stage R. --There were associated insignificant arterial oxygen desaturations noted down to 92% --There was no significant PLMS noted in this study. --There is moderate snoring noted throughout the study.    RECOMMENDATIONS:  --CPAP titration study is not clearly indicated in this case due to insignificant OSA. --Nasal decongestants and antihistamines may be of help for increased upper airways resistance when present. --Weight loss through dietary and lifestyle modification is recommended in the presence of obesity. --A search for and treatment of any underlying cardiopulmonary disease is      recommended in the presence of oxygen desaturations. --Alternative treatment options if the patient is not willing to use CPAP include oral   appliances as well as surgical intervention which may help in the appropriate patient. --Clinical correlation is recommended. Please feel free to call the office for any further  questions or assistance  in the care of this patient.     Allyne Gee, MD [facility] Pulmonary Critical Care Medicine Sleep medicine

## 2019-05-28 ENCOUNTER — Encounter: Payer: Self-pay | Admitting: Internal Medicine

## 2019-05-28 ENCOUNTER — Other Ambulatory Visit: Payer: Self-pay

## 2019-05-28 ENCOUNTER — Ambulatory Visit: Payer: Medicaid Other | Admitting: Internal Medicine

## 2019-05-28 VITALS — BP 124/88 | HR 85 | Resp 16 | Ht 67.5 in | Wt 203.0 lb

## 2019-05-28 DIAGNOSIS — I10 Essential (primary) hypertension: Secondary | ICD-10-CM | POA: Diagnosis not present

## 2019-05-28 DIAGNOSIS — F17219 Nicotine dependence, cigarettes, with unspecified nicotine-induced disorders: Secondary | ICD-10-CM

## 2019-05-28 DIAGNOSIS — G4734 Idiopathic sleep related nonobstructive alveolar hypoventilation: Secondary | ICD-10-CM | POA: Diagnosis not present

## 2019-05-28 DIAGNOSIS — J449 Chronic obstructive pulmonary disease, unspecified: Secondary | ICD-10-CM

## 2019-05-28 NOTE — Progress Notes (Signed)
[facility] [address] [place], [place] [address]  Pulmonary Sleep Medicine   Office Visit Note  Patient Name: Traci Cross DOB: Jun 21, 1961 MRN [id number]  Date of Service: 05/28/2019  Complaints/HPI: Pt is here for follow up.  She has been wearing her oxygen faithfully at night.  She reports excellent relief of symptoms using oxygen.  She needs to be re-certified at this time.  Will get an overnight oximetry to evaluate patients hypoxia.   ROS  General: (-) fever, (-) chills, (-) night sweats, (-) weakness Skin: (-) rashes, (-) itching,. Eyes: (-) visual changes, (-) redness, (-) itching. Nose and Sinuses: (-) nasal stuffiness or itchiness, (-) postnasal drip, (-) nosebleeds, (-) sinus trouble. Mouth and Throat: (-) sore throat, (-) hoarseness. Neck: (-) swollen glands, (-) enlarged thyroid, (-) neck pain. Respiratory: - cough, (-) bloody sputum, - shortness of breath, - wheezing. Cardiovascular: - ankle swelling, (-) chest pain. Lymphatic: (-) lymph node enlargement. Neurologic: (-) numbness, (-) tingling. Psychiatric: (-) anxiety, (-) depression   Current Medication: Outpatient Encounter Medications as of 05/28/2019  Medication Sig  . acetaminophen (TYLENOL) 500 MG tablet Take 1,000 mg by mouth as needed.  Marland Kitchen. albuterol (VENTOLIN HFA) 108 (90 Base) MCG/ACT inhaler Inhale 2 puffs into the lungs every 6 (six) hours as needed for wheezing or shortness of breath.  Marland Kitchen. aspirin EC 81 MG tablet Take 1 tablet (81 mg total) by mouth daily.  Marland Kitchen. atorvastatin (LIPITOR) 40 MG tablet Take 1 tablet (40 mg total) by mouth daily at 6 PM.  . budesonide-formoterol (SYMBICORT) 160-4.5 MCG/ACT inhaler Inhale 2 puffs into the lungs 2 (two) times daily.  . hydrochlorothiazide (HYDRODIURIL) 25 MG tablet Take 1 tablet (25 mg total) by mouth daily.  . OXYGEN Inhale into the lungs. Oxygen at night 2 l   No facility-administered encounter medications on file as of 05/28/2019.     Surgical  History: Past Surgical History:  Procedure Laterality Date  . CHOLECYSTECTOMY    . TONSILLECTOMY      Medical History: Past Medical History:  Diagnosis Date  . Asthma   . COPD (chronic obstructive pulmonary disease) (HCC)   . CVA (cerebral vascular accident) (HCC) 04/19/2019  . Depression   . Hypertension   . Sleep apnea   . Tobacco abuse     Family History: Family History  Problem Relation Age of Onset  . Heart failure Mother   . Stroke Mother   . Alzheimer's disease Mother   . Heart failure Father   . Stroke Father   . Dementia Father     Social History: Social History   Socioeconomic History  . Marital status: Single    Spouse name: Not on file  . Number of children: Not on file  . Years of education: Not on file  . Highest education level: Not on file  Occupational History  . Not on file  Social Needs  . Financial resource strain: Not on file  . Food insecurity    Worry: Not on file    Inability: Not on file  . Transportation needs    Medical: Not on file    Non-medical: Not on file  Tobacco Use  . Smoking status: Current Every Day Smoker    Packs/day: 0.25    Years: 35.00    Pack years: 8.75    Types: Cigarettes  . Smokeless tobacco: Never Used  Substance and Sexual Activity  . Alcohol use: No    Alcohol/week: 0.0 standard drinks  .  Drug use: No  . Sexual activity: Not on file  Lifestyle  . Physical activity    Days per week: Not on file    Minutes per session: Not on file  . Stress: Not on file  Relationships  . Social Musician on phone: Not on file    Gets together: Not on file    Attends religious service: Not on file    Active member of club or organization: Not on file    Attends meetings of clubs or organizations: Not on file    Relationship status: Not on file  . Intimate partner violence    Fear of current or ex partner: Not on file    Emotionally abused: Not on file    Physically abused: Not on file    Forced  sexual activity: Not on file  Other Topics Concern  . Not on file  Social History Narrative   Lives at home alone   Right handed   Caffeine: up to a 2L/day of soda     Vital Signs: Blood pressure 124/88, pulse 85, resp. rate 16, height 5' 7.5" (1.715 m), weight 203 lb (92.1 kg), SpO2 98 %.  Examination: General Appearance: The patient is well-developed, well-nourished, and in no distress. Skin: Gross inspection of skin unremarkable. Head: normocephalic, no gross deformities. Eyes: no gross deformities noted. ENT: ears appear grossly normal no exudates. Neck: Supple. No thyromegaly. No LAD. Respiratory: clear bilaterally. Cardiovascular: Normal S1 and S2 without murmur or rub. Extremities: No cyanosis. pulses are equal. Neurologic: Alert and oriented. No involuntary movements.  LABS: Recent Results (from the past 2160 hour(s))  Glucose, capillary     Status: Abnormal   Collection Time: 04/19/19  1:00 PM  Result Value Ref Range   Glucose-Capillary 103 (H) 70 - 99 mg/dL  Protime-INR     Status: None   Collection Time: 04/19/19  1:09 PM  Result Value Ref Range   Prothrombin Time 12.1 11.4 - 15.2 seconds   INR 0.9 0.8 - 1.2    Comment: (NOTE) INR goal varies based on device and disease states. Performed at [facility], [address] [place], [place] [address]   APTT     Status: None   Collection Time: 04/19/19  1:09 PM  Result Value Ref Range   aPTT 31 24 - 36 seconds    Comment: Performed at [facility], [address] [place], [place] [address]  CBC     Status: None   Collection Time: 04/19/19  1:09 PM  Result Value Ref Range   WBC 8.0 4.0 - 10.5 K/uL   RBC 4.89 3.87 - 5.11 MIL/uL   Hemoglobin 14.4 12.0 - 15.0 g/dL   HCT 29.5 62.1 - 30.8 %   MCV 87.3 80.0 - 100.0 fL   MCH 29.4 26.0 - 34.0 pg   MCHC 33.7 30.0 - 36.0 g/dL   RDW 65.7 84.6 - 96.2 %   Platelets 195 150 - 400 K/uL   nRBC 0.0 0.0 - 0.2 %    Comment: Performed at [facility], [address] [place], [place] [address]  Differential     Status: None   Collection Time: 04/19/19  1:09 PM  Result Value Ref Range   Neutrophils Relative % 61 %   Neutro Abs 4.9 1.7 - 7.7 K/uL   Lymphocytes Relative 26 %   Lymphs Abs 2.1 0.7 - 4.0 K/uL   Monocytes Relative 8 %  Monocytes Absolute 0.7 0.1 - 1.0 K/uL   Eosinophils Relative 4 %   Eosinophils Absolute 0.3 0.0 - 0.5 K/uL   Basophils Relative 1 %   Basophils Absolute 0.1 0.0 - 0.1 K/uL   Immature Granulocytes 0 %   Abs Immature Granulocytes 0.01 0.00 - 0.07 K/uL    Comment: Performed at [facility], [address] [place], [place] [address]  Comprehensive metabolic panel     Status: Abnormal   Collection Time: 04/19/19  1:09 PM  Result Value Ref Range   Sodium 141 135 - 145 mmol/L   Potassium 3.3 (L) 3.5 - 5.1 mmol/L   Chloride 108 98 - 111 mmol/L   CO2 24 22 - 32 mmol/L   Glucose, Bld 99 70 - 99 mg/dL   BUN 14 6 - 20 mg/dL   Creatinine, Ser 6.040.76 0.44 - 1.00 mg/dL   Calcium 8.3 (L) 8.9 - 10.3 mg/dL   Total Protein 6.5 6.5 - 8.1 g/dL   Albumin 3.7 3.5 - 5.0 g/dL   AST 19 15 - 41 U/L   ALT 15 0 - 44 U/L   Alkaline Phosphatase 71 38 - 126 U/L   Total Bilirubin 0.4 0.3 - 1.2 mg/dL   GFR calc non Af Amer >60 >60 mL/min   GFR calc Af Amer >60 >60 mL/min   Anion gap 9 5 - 15    Comment: Performed at [facility], [address] [place], [place] [address]  Novel Coronavirus,NAA,(SEND-OUT TO REF LAB - TAT 24-48 hrs); Hosp Order     Status: None   Collection Time: 04/19/19 10:19 PM   Specimen: Nasopharyngeal Swab; Respiratory  Result Value Ref Range   SARS-CoV-2, NAA NOT DETECTED NOT DETECTED    Comment: (NOTE) This test was developed and its performance characteristics determined by [facility]. This test has not been FDA cleared or approved. This test has been authorized by FDA under an Emergency Use Authorization (EUA). This test is only authorized for the  duration of time the declaration that circumstances exist justifying the authorization of the emergency use of in vitro diagnostic tests for detection of SARS-CoV-2 virus and/or diagnosis of COVID-19 infection under section 564(b)(1) of the Act, 21 U.S.C. 119JYN-8(G)(9360bbb-3(b)(1), unless the authorization is terminated or revoked sooner. When diagnostic testing is negative, the possibility of a false negative result should be considered in the context of a patient's recent exposures and the presence of clinical signs and symptoms consistent with COVID-19. An individual without symptoms of COVID-19 and who is not shedding SARS-CoV-2 virus would expect to have a negative (not detected) result in this assay. Performed  At: [facility] [place] [address] [place], [place] [id number] Jolene SchimkeNagendra Sanjai MD HQ:[id number]:[phone]    Coronavirus Source NASOPHARYNGEAL     Comment: Performed at [facility], [address] [place], [place] [address]  HIV antibody (Routine Testing)     Status: None   Collection Time: 04/20/19  5:30 AM  Result Value Ref Range   HIV Screen 4th Generation wRfx Non Reactive Non Reactive    Comment: (NOTE) Performed At: [facility] [place] [address] [place], [place] [id number] Jolene SchimkeNagendra Sanjai MD UY:[id number]:[phone]   Hemoglobin A1c     Status: None   Collection Time: 04/20/19  5:30 AM  Result Value Ref Range   Hgb A1c MFr Bld 5.4 4.8 - 5.6 %    Comment: (NOTE) Pre diabetes:          5.7%-6.4% Diabetes:              >  6.4% Glycemic control for   <7.0% adults with diabetes    Mean Plasma Glucose 108.28 mg/dL    Comment: Performed at [facility] [address] [place], [place] [address]  Lipid panel     Status: Abnormal   Collection Time: 04/20/19  5:30 AM  Result Value Ref Range   Cholesterol 151 0 - 200 mg/dL   Triglycerides 265 (H) <150 mg/dL   HDL 33 (L) >40 mg/dL   Total CHOL/HDL Ratio 4.6 RATIO   VLDL 53 (H) 0 - 40 mg/dL   LDL Cholesterol 65 0 - 99  mg/dL    Comment:        Total Cholesterol/HDL:CHD Risk Coronary Heart Disease Risk Table                     Men   Women  1/2 Average Risk   3.4   3.3  Average Risk       5.0   4.4  2 X Average Risk   9.6   7.1  3 X Average Risk  23.4   11.0        Use the calculated Patient Ratio above and the CHD Risk Table to determine the patient's CHD Risk.        ATP III CLASSIFICATION (LDL):  <100     mg/dL   Optimal  100-129  mg/dL   Near or Above                    Optimal  130-159  mg/dL   Borderline  160-189  mg/dL   High  >190     mg/dL   Very High Performed at [facility], [address] [place], [place] [address]   Basic metabolic panel     Status: Abnormal   Collection Time: 04/20/19  5:30 AM  Result Value Ref Range   Sodium 140 135 - 145 mmol/L   Potassium 3.3 (L) 3.5 - 5.1 mmol/L   Chloride 108 98 - 111 mmol/L   CO2 25 22 - 32 mmol/L   Glucose, Bld 103 (H) 70 - 99 mg/dL   BUN 16 6 - 20 mg/dL   Creatinine, Ser 0.70 0.44 - 1.00 mg/dL   Calcium 8.1 (L) 8.9 - 10.3 mg/dL   GFR calc non Af Amer >60 >60 mL/min   GFR calc Af Amer >60 >60 mL/min   Anion gap 7 5 - 15    Comment: Performed at [facility], [address], [place], [place] [address]  Magnesium     Status: None   Collection Time: 04/20/19  5:30 AM  Result Value Ref Range   Magnesium 2.3 1.7 - 2.4 mg/dL    Comment: Performed at [facility], [address] [place], [place] [address]  ECHOCARDIOGRAM COMPLETE     Status: None   Collection Time: 04/21/19 12:18 PM  Result Value Ref Range   Weight 3,321.6 oz   Height 67.5 in   BP 145/94 mmHg    Radiology: No results found.  No results found.  No results found.    Assessment and Plan: Patient Active Problem List   Diagnosis Date Noted  . Lacunar stroke ([place]) 04/28/2019  . Medically noncompliant 04/28/2019  . Current smoker 04/28/2019  . Other chronic pain 04/28/2019  . Acute CVA (cerebrovascular accident) ([place])  04/19/2019  . Bilateral carpal tunnel syndrome 12/08/2016  . Goiter 08/12/2015  . COPD (chronic obstructive pulmonary disease) ([place])  04/15/2015  . Asthma 04/15/2015  . Accelerated hypertension 03/26/2015  . Essential hypertension 03/26/2015  . Tobacco abuse 03/25/2015  . COPD exacerbation (HCC) 03/25/2015  . Acute respiratory failure (HCC) 03/25/2015    1. Chronic obstructive pulmonary disease, unspecified COPD type (HCC) Stable, pt is doing well at this time.  Unfortunately continues to smoke 1 PPD, we again discussed smoking cessation.   2. Nocturnal hypoxia Overnight oximetry ordered, instructed patient to NOT wear her oxygen during test. - Pulse oximetry, overnight; Future  3. Essential hypertension Stable, continue present management.  4. Cigarette nicotine dependence with nicotine-induced disorder Smoking cessation counseling: 1. Pt acknowledges the risks of long term smoking, she will try to quite smoking. 2. Options for different medications including nicotine products, chewing gum, patch etc, Wellbutrin and Chantix is discussed 3. Goal and date of compete cessation is discussed 4. Total time spent in smoking cessation is 15 min.  General Counseling: I have discussed the findings of the evaluation and examination with Traci Cross.  I have also discussed any further diagnostic evaluation thatmay be needed or ordered today. Traci Chapelllen verbalizes understanding of the findings of todays visit. We also reviewed her medications today and discussed drug interactions and side effects including but not limited excessive drowsiness and altered mental states. We also discussed that there is always a risk not just to her but also people around her. she has been encouraged to call the office with any questions or concerns that should arise related to todays visit.    Time spent: 15  I have personally obtained a history, examined the patient, evaluated laboratory and imaging results, formulated the  assessment and plan and placed orders.    Yevonne PaxSaadat A Khan, MD [facility] Pulmonary and Critical Care Sleep medicine

## 2019-05-29 ENCOUNTER — Telehealth: Payer: Self-pay

## 2019-05-29 NOTE — Telephone Encounter (Signed)
Faxed [employer] overnight oximetry order for re cert O2. Beth

## 2019-06-10 ENCOUNTER — Encounter: Payer: Self-pay | Admitting: Internal Medicine
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
## 2019-06-20 ENCOUNTER — Encounter: Payer: Self-pay | Admitting: Adult Health

## 2019-06-25 ENCOUNTER — Other Ambulatory Visit: Payer: Self-pay

## 2019-06-25 ENCOUNTER — Encounter: Payer: Self-pay | Admitting: Adult Health

## 2019-06-25 ENCOUNTER — Ambulatory Visit (INDEPENDENT_AMBULATORY_CARE_PROVIDER_SITE_OTHER): Payer: Medicaid Other | Admitting: Adult Health

## 2019-06-25 DIAGNOSIS — F17219 Nicotine dependence, cigarettes, with unspecified nicotine-induced disorders: Secondary | ICD-10-CM | POA: Diagnosis not present

## 2019-06-25 DIAGNOSIS — R1030 Lower abdominal pain, unspecified: Secondary | ICD-10-CM | POA: Diagnosis not present

## 2019-06-25 DIAGNOSIS — I10 Essential (primary) hypertension: Secondary | ICD-10-CM | POA: Diagnosis not present

## 2019-06-25 DIAGNOSIS — J449 Chronic obstructive pulmonary disease, unspecified: Secondary | ICD-10-CM

## 2019-06-25 NOTE — Progress Notes (Signed)
[facility] [address] [place], [place] [address]  Internal MEDICINE  Telephone Visit  Patient Name: Traci Cross  [id number]  [id number]  Date of Service: 06/25/2019  I connected with the patient at 1115 by telephone and verified the patients identity using two identifiers.   I discussed the limitations, risks, security and privacy concerns of performing an evaluation and management service by telephone and the availability of in person appointments. I also discussed with the patient that there may be a patient responsible charge related to the service.  The patient expressed understanding and agrees to proceed.    Chief Complaint  Patient presents with  . Telephone Screen  . Abdominal Pain    right lower pain in stomach and going to to back , started about noontime yesterday got real sick to stomach .  . Telephone Assessment    HPI  Pt reports that she layed down for a nap with grandson yesterday and felt sick on her stomach. She reports that she began to feel pain right lower quadrant and radiates into lower back.  She denies any diarrhea, constipation, vomiting, urinary changes. No fever or chills.  She describes the pain as constant, that is worse with movement.   She reports the pain is slightly improved when applying deep pressure.     Current Medication: Outpatient Encounter Medications as of 06/25/2019  Medication Sig  . acetaminophen (TYLENOL) 500 MG tablet Take 1,000 mg by mouth as needed.  Marland Kitchen. albuterol (VENTOLIN HFA) 108 (90 Base) MCG/ACT inhaler Inhale 2 puffs into the lungs every 6 (six) hours as needed for wheezing or shortness of breath.  Marland Kitchen. aspirin EC 81 MG tablet Take 1 tablet (81 mg total) by mouth daily.  Marland Kitchen. atorvastatin (LIPITOR) 40 MG tablet Take 1 tablet (40 mg total) by mouth daily at 6 PM.  . budesonide-formoterol (SYMBICORT) 160-4.5 MCG/ACT inhaler Inhale 2 puffs into the lungs 2 (two) times daily.  . hydrochlorothiazide (HYDRODIURIL) 25 MG tablet  Take 1 tablet (25 mg total) by mouth daily.  . OXYGEN Inhale into the lungs. Oxygen at night 2 l   No facility-administered encounter medications on file as of 06/25/2019.     Surgical History: Past Surgical History:  Procedure Laterality Date  . CHOLECYSTECTOMY    . TONSILLECTOMY      Medical History: Past Medical History:  Diagnosis Date  . Asthma   . COPD (chronic obstructive pulmonary disease) (HCC)   . CVA (cerebral vascular accident) (HCC) 04/19/2019  . Depression   . Hypertension   . Sleep apnea   . Tobacco abuse     Family History: Family History  Problem Relation Age of Onset  . Heart failure Mother   . Stroke Mother   . Alzheimer's disease Mother   . Heart failure Father   . Stroke Father   . Dementia Father     Social History   Socioeconomic History  . Marital status: Single    Spouse name: Not on file  . Number of children: Not on file  . Years of education: Not on file  . Highest education level: Not on file  Occupational History  . Not on file  Social Needs  . Financial resource strain: Not on file  . Food insecurity    Worry: Not on file    Inability: Not on file  . Transportation needs    Medical: Not on file    Non-medical: Not on file  Tobacco Use  . Smoking  status: Current Every Day Smoker    Packs/day: 0.25    Years: 35.00    Pack years: 8.75    Types: Cigarettes  . Smokeless tobacco: Never Used  Substance and Sexual Activity  . Alcohol use: No    Alcohol/week: 0.0 standard drinks  . Drug use: No  . Sexual activity: Not on file  Lifestyle  . Physical activity    Days per week: Not on file    Minutes per session: Not on file  . Stress: Not on file  Relationships  . Social Herbalist on phone: Not on file    Gets together: Not on file    Attends religious service: Not on file    Active member of club or organization: Not on file    Attends meetings of clubs or organizations: Not on file    Relationship status: Not  on file  . Intimate partner violence    Fear of current or ex partner: Not on file    Emotionally abused: Not on file    Physically abused: Not on file    Forced sexual activity: Not on file  Other Topics Concern  . Not on file  Social History Narrative   Lives at home alone   Right handed   Caffeine: up to a 2L/day of soda       Review of Systems  Constitutional: Negative for chills, fatigue and unexpected weight change.  HENT: Negative for congestion, rhinorrhea, sneezing and sore throat.   Eyes: Negative for photophobia, pain and redness.  Respiratory: Negative for cough, chest tightness and shortness of breath.   Cardiovascular: Negative for chest pain and palpitations.  Gastrointestinal: Negative for abdominal pain, constipation, diarrhea, nausea and vomiting.  Endocrine: Negative.   Genitourinary: Negative for dysuria and frequency.  Musculoskeletal: Negative for arthralgias, back pain, joint swelling and neck pain.  Skin: Negative for rash.  Allergic/Immunologic: Negative.   Neurological: Negative for tremors and numbness.  Hematological: Negative for adenopathy. Does not bruise/bleed easily.  Psychiatric/Behavioral: Negative for behavioral problems and sleep disturbance. The patient is not nervous/anxious.     Vital Signs: There were no vitals taken for this visit.   Observation/Objective:  Well sounding, nad noted.    Assessment/Plan: 1. Lower abdominal pain Concerning for appendicitis.  Will get CT abdomen pelvis to evaluate. - CT Abdomen Pelvis W Contrast; Future  2. Chronic obstructive pulmonary disease, unspecified COPD type ([place]) Stable at this time.  Continue present management.  3. Essential hypertension Patient reports BP has been controlled.  Continue present management  4. Cigarette nicotine dependence with nicotine-induced disorder Smoking cessation counseling: 1. Pt acknowledges the risks of long term smoking, she will try to quite  smoking. 2. Options for different medications including nicotine products, chewing gum, patch etc, Wellbutrin and Chantix is discussed 3. Goal and date of compete cessation is discussed 4. Total time spent in smoking cessation is 15 min.   General Counseling: ziah turvey understanding of the findings of today's phone visit and agrees with plan of treatment. I have discussed any further diagnostic evaluation that may be needed or ordered today. We also reviewed her medications today. she has been encouraged to call the office with any questions or concerns that should arise related to todays visit.    No orders of the defined types were placed in this encounter.   No orders of the defined types were placed in this encounter.   Time spent: 15 Minutes  Orson Gear AGNP-C Internal medicine

## 2019-06-26 ENCOUNTER — Telehealth: Payer: Self-pay

## 2019-06-26 NOTE — Telephone Encounter (Signed)
Patient has been advised her ct scan has not been approved and if her pain gets worse or does not get any better to please go to ER and have a work up. Traci Cross

## 2019-06-27 ENCOUNTER — Telehealth: Payer: Self-pay

## 2019-06-27 NOTE — Telephone Encounter (Signed)
CMN SIGNED AND PLACED IN [place] FOLDER. °

## 2019-06-28 ENCOUNTER — Inpatient Hospital Stay
Admission: EM | Admit: 2019-06-28 | Discharge: 2019-07-01 | DRG: 331 | Disposition: A | Discharge status: Home / Self Care | Payer: Medicaid Other | Attending: Surgery | Admitting: Surgery

## 2019-06-28 ENCOUNTER — Ambulatory Visit
Admission: RE | Admit: 2019-06-28 | Discharge: 2019-06-28 | Disposition: A | Discharge status: Home / Self Care | Payer: Medicaid Other | Source: Ambulatory Visit | Attending: Adult Health | Admitting: Adult Health

## 2019-06-28 ENCOUNTER — Other Ambulatory Visit: Payer: Self-pay

## 2019-06-28 DIAGNOSIS — Z79899 Other long term (current) drug therapy: Secondary | ICD-10-CM | POA: Diagnosis not present

## 2019-06-28 DIAGNOSIS — Z880 Allergy status to penicillin: Secondary | ICD-10-CM

## 2019-06-28 DIAGNOSIS — Z8249 Family history of ischemic heart disease and other diseases of the circulatory system: Secondary | ICD-10-CM | POA: Diagnosis not present

## 2019-06-28 DIAGNOSIS — K8050 Calculus of bile duct without cholangitis or cholecystitis without obstruction: Secondary | ICD-10-CM | POA: Diagnosis present

## 2019-06-28 DIAGNOSIS — F17210 Nicotine dependence, cigarettes, uncomplicated: Secondary | ICD-10-CM | POA: Diagnosis present

## 2019-06-28 DIAGNOSIS — G4730 Sleep apnea, unspecified: Secondary | ICD-10-CM | POA: Diagnosis present

## 2019-06-28 DIAGNOSIS — Z885 Allergy status to narcotic agent status: Secondary | ICD-10-CM | POA: Diagnosis not present

## 2019-06-28 DIAGNOSIS — Z20828 Contact with and (suspected) exposure to other viral communicable diseases: Secondary | ICD-10-CM | POA: Diagnosis present

## 2019-06-28 DIAGNOSIS — J449 Chronic obstructive pulmonary disease, unspecified: Secondary | ICD-10-CM | POA: Diagnosis present

## 2019-06-28 DIAGNOSIS — Z8673 Personal history of transient ischemic attack (TIA), and cerebral infarction without residual deficits: Secondary | ICD-10-CM

## 2019-06-28 DIAGNOSIS — Z7951 Long term (current) use of inhaled steroids: Secondary | ICD-10-CM

## 2019-06-28 DIAGNOSIS — R1030 Lower abdominal pain, unspecified: Secondary | ICD-10-CM

## 2019-06-28 DIAGNOSIS — I10 Essential (primary) hypertension: Secondary | ICD-10-CM | POA: Diagnosis present

## 2019-06-28 DIAGNOSIS — Z888 Allergy status to other drugs, medicaments and biological substances status: Secondary | ICD-10-CM

## 2019-06-28 DIAGNOSIS — K37 Unspecified appendicitis: Secondary | ICD-10-CM | POA: Diagnosis present

## 2019-06-28 DIAGNOSIS — K3580 Unspecified acute appendicitis: Secondary | ICD-10-CM

## 2019-06-28 DIAGNOSIS — F329 Major depressive disorder, single episode, unspecified: Secondary | ICD-10-CM | POA: Diagnosis present

## 2019-06-28 DIAGNOSIS — Z7982 Long term (current) use of aspirin: Secondary | ICD-10-CM | POA: Diagnosis not present

## 2019-06-28 DIAGNOSIS — K3589 Other acute appendicitis without perforation or gangrene: Secondary | ICD-10-CM

## 2019-06-28 DIAGNOSIS — K3532 Acute appendicitis with perforation and localized peritonitis, without abscess: Secondary | ICD-10-CM | POA: Diagnosis present

## 2019-06-28 LAB — COMPREHENSIVE METABOLIC PANEL
ALT: 11 U/L (ref 0–44)
AST: 17 U/L (ref 15–41)
Albumin: 3.8 g/dL (ref 3.5–5.0)
Alkaline Phosphatase: 83 U/L (ref 38–126)
Anion gap: 12 (ref 5–15)
BUN: 19 mg/dL (ref 6–20)
CO2: 27 mmol/L (ref 22–32)
Calcium: 8.7 mg/dL — ABNORMAL LOW (ref 8.9–10.3)
Chloride: 95 mmol/L — ABNORMAL LOW (ref 98–111)
Creatinine, Ser: 0.87 mg/dL (ref 0.44–1.00)
GFR calc Af Amer: >60 mL/min (ref >60)
GFR calc non Af Amer: >60 mL/min (ref >60)
Glucose, Bld: 103 mg/dL — ABNORMAL HIGH (ref 70–99)
Potassium: 3.4 mmol/L — ABNORMAL LOW (ref 3.5–5.1)
Sodium: 134 mmol/L — ABNORMAL LOW (ref 135–145)
Total Bilirubin: 0.7 mg/dL (ref 0.3–1.2)
Total Protein: 7.5 g/dL (ref 6.5–8.1)

## 2019-06-28 LAB — CBC WITH DIFFERENTIAL/PLATELET
Abs Immature Granulocytes: 0.04 10*3/uL (ref 0.00–0.07)
Basophils Absolute: 0.1 10*3/uL (ref 0.0–0.1)
Basophils Relative: 0 %
Eosinophils Absolute: 0.2 10*3/uL (ref 0.0–0.5)
Eosinophils Relative: 1 %
HCT: 41.3 % (ref 36.0–46.0)
Hemoglobin: 14.3 g/dL (ref 12.0–15.0)
Immature Granulocytes: 0 %
Lymphocytes Relative: 14 %
Lymphs Abs: 1.7 10*3/uL (ref 0.7–4.0)
MCH: 29.7 pg (ref 26.0–34.0)
MCHC: 34.6 g/dL (ref 30.0–36.0)
MCV: 85.9 fL (ref 80.0–100.0)
Monocytes Absolute: 0.8 10*3/uL (ref 0.1–1.0)
Monocytes Relative: 7 %
Neutro Abs: 9.5 10*3/uL — ABNORMAL HIGH (ref 1.7–7.7)
Neutrophils Relative %: 78 %
Platelets: 240 10*3/uL (ref 150–400)
RBC: 4.81 MIL/uL (ref 3.87–5.11)
RDW: 12.4 % (ref 11.5–15.5)
WBC: 12.2 10*3/uL — ABNORMAL HIGH (ref 4.0–10.5)
nRBC: 0 % (ref 0.0–0.2)

## 2019-06-28 LAB — POCT I-STAT CREATININE: Creatinine, Ser: 0.9 mg/dL (ref 0.44–1.00)

## 2019-06-28 LAB — SURGICAL PCR SCREEN
MRSA, PCR: NEGATIVE
Staphylococcus aureus: NEGATIVE

## 2019-06-28 LAB — LIPASE, BLOOD: Lipase: 28 U/L (ref 11–51)

## 2019-06-28 LAB — SARS CORONAVIRUS 2 BY RT PCR (HOSPITAL ORDER, PERFORMED IN ~~LOC~~ HOSPITAL LAB): SARS Coronavirus 2: NEGATIVE

## 2019-06-28 MED ORDER — SODIUM CHLORIDE 0.9 % IV SOLN
2.0000 g | Freq: Three times a day (TID) | INTRAVENOUS | Status: DC
  Administered 2019-06-28 – 2019-07-01 (×8): 2 g via INTRAVENOUS
  Filled 2019-06-28 (×10): qty 2

## 2019-06-28 MED ORDER — HEPARIN SODIUM (PORCINE) 5000 UNIT/ML IJ SOLN
5000.0000 [IU] | Freq: Three times a day (TID) | INTRAMUSCULAR | Status: DC
  Administered 2019-06-28 – 2019-07-01 (×5): 5000 [IU] via SUBCUTANEOUS
  Filled 2019-06-28 (×7): qty 1

## 2019-06-28 MED ORDER — NICOTINE 14 MG/24HR TD PT24
14.0000 mg | MEDICATED_PATCH | Freq: Once | TRANSDERMAL | Status: AC
  Administered 2019-06-28: 22:00:00 14 mg via TRANSDERMAL
  Filled 2019-06-28: qty 1

## 2019-06-28 MED ORDER — KETOROLAC TROMETHAMINE 15 MG/ML IJ SOLN
15.0000 mg | Freq: Four times a day (QID) | INTRAMUSCULAR | Status: DC | PRN
  Administered 2019-06-29 – 2019-06-30 (×3): 15 mg via INTRAVENOUS
  Filled 2019-06-28 (×3): qty 1

## 2019-06-28 MED ORDER — ASPIRIN EC 81 MG PO TBEC
81.0000 mg | DELAYED_RELEASE_TABLET | Freq: Every day | ORAL | Status: DC
  Administered 2019-06-30 – 2019-07-01 (×2): 81 mg via ORAL
  Filled 2019-06-28 (×2): qty 1

## 2019-06-28 MED ORDER — ONDANSETRON HCL 4 MG/2ML IJ SOLN
4.0000 mg | Freq: Once | INTRAMUSCULAR | Status: AC
  Administered 2019-06-28: 16:00:00 4 mg via INTRAVENOUS
  Filled 2019-06-28: qty 2

## 2019-06-28 MED ORDER — ALBUTEROL SULFATE (2.5 MG/3ML) 0.083% IN NEBU: 3.0000 mL | INHALATION_SOLUTION | Freq: Four times a day (QID) | RESPIRATORY_TRACT | Status: DC | PRN

## 2019-06-28 MED ORDER — MUPIROCIN 2 % EX OINT
1.0000 "application " | TOPICAL_OINTMENT | Freq: Two times a day (BID) | CUTANEOUS | Status: DC
  Filled 2019-06-28: qty 22

## 2019-06-28 MED ORDER — PROCHLORPERAZINE MALEATE 10 MG PO TABS
10.0000 mg | ORAL_TABLET | Freq: Four times a day (QID) | ORAL | Status: DC | PRN
  Filled 2019-06-28: qty 1

## 2019-06-28 MED ORDER — PROCHLORPERAZINE EDISYLATE 10 MG/2ML IJ SOLN
5.0000 mg | Freq: Four times a day (QID) | INTRAMUSCULAR | Status: DC | PRN
  Filled 2019-06-28: qty 2

## 2019-06-28 MED ORDER — DIPHENHYDRAMINE HCL 50 MG/ML IJ SOLN
12.5000 mg | Freq: Once | INTRAMUSCULAR | Status: AC
  Administered 2019-06-28: 17:00:00 12.5 mg via INTRAVENOUS
  Filled 2019-06-28: qty 1

## 2019-06-28 MED ORDER — MOMETASONE FURO-FORMOTEROL FUM 200-5 MCG/ACT IN AERO
2.0000 | INHALATION_SPRAY | Freq: Two times a day (BID) | RESPIRATORY_TRACT | Status: DC
  Administered 2019-06-28 – 2019-07-01 (×5): 2 via RESPIRATORY_TRACT
  Filled 2019-06-28: qty 8.8

## 2019-06-28 MED ORDER — SODIUM CHLORIDE 0.9 % IV SOLN
Freq: Once | INTRAVENOUS | Status: AC
  Administered 2019-06-28: 18:00:00 via INTRAVENOUS

## 2019-06-28 MED ORDER — METRONIDAZOLE IN NACL 5-0.79 MG/ML-% IV SOLN
500.0000 mg | Freq: Three times a day (TID) | INTRAVENOUS | Status: DC
  Administered 2019-06-28 – 2019-07-01 (×8): 500 mg via INTRAVENOUS
  Filled 2019-06-28 (×10): qty 100

## 2019-06-28 MED ORDER — ONDANSETRON HCL 4 MG/2ML IJ SOLN
4.0000 mg | Freq: Four times a day (QID) | INTRAMUSCULAR | Status: DC | PRN
  Administered 2019-06-28: 20:00:00 4 mg via INTRAVENOUS
  Filled 2019-06-28: qty 2

## 2019-06-28 MED ORDER — SODIUM CHLORIDE 0.9 % IV BOLUS
500.0000 mL | Freq: Once | INTRAVENOUS | Status: AC
  Administered 2019-06-28: 500 mL via INTRAVENOUS

## 2019-06-28 MED ORDER — IOHEXOL 300 MG/ML  SOLN
100.0000 mL | Freq: Once | INTRAMUSCULAR | Status: AC | PRN
  Administered 2019-06-28: 100 mL via INTRAVENOUS

## 2019-06-28 MED ORDER — SODIUM CHLORIDE 0.9 % IV SOLN
INTRAVENOUS | Status: DC
  Administered 2019-06-28: 22:00:00 via INTRAVENOUS
  Administered 2019-06-29: 14:00:00 100 mL via INTRAVENOUS
  Administered 2019-06-29 – 2019-06-30 (×2): via INTRAVENOUS

## 2019-06-28 MED ORDER — PIPERACILLIN-TAZOBACTAM 3.375 G IVPB 30 MIN
3.3750 g | Freq: Once | INTRAVENOUS | Status: AC
  Administered 2019-06-28: 3.375 g via INTRAVENOUS
  Filled 2019-06-28: qty 50

## 2019-06-28 MED ORDER — HYDRALAZINE HCL 20 MG/ML IJ SOLN: 10.0000 mg | INTRAMUSCULAR | Status: DC | PRN

## 2019-06-28 MED ORDER — ONDANSETRON 4 MG PO TBDP: 4.0000 mg | ORAL_TABLET | Freq: Four times a day (QID) | ORAL | Status: DC | PRN

## 2019-06-28 MED ORDER — MORPHINE SULFATE (PF) 4 MG/ML IV SOLN
4.0000 mg | INTRAVENOUS | Status: DC | PRN
  Administered 2019-06-28: 4 mg via INTRAVENOUS
  Filled 2019-06-28: qty 1

## 2019-06-28 MED ORDER — OXYCODONE HCL 5 MG PO TABS
5.0000 mg | ORAL_TABLET | ORAL | Status: DC | PRN
  Administered 2019-06-28 – 2019-06-29 (×2): 10 mg via ORAL
  Administered 2019-06-29: 5 mg via ORAL
  Administered 2019-06-30: 10 mg via ORAL
  Administered 2019-06-30: 5 mg via ORAL
  Administered 2019-06-30: 10 mg via ORAL
  Filled 2019-06-28: qty 2
  Filled 2019-06-28: qty 1
  Filled 2019-06-28: qty 2
  Filled 2019-06-28: qty 1
  Filled 2019-06-28 (×2): qty 2
  Filled 2019-06-28: qty 1

## 2019-06-28 MED ORDER — MORPHINE SULFATE (PF) 2 MG/ML IV SOLN
2.0000 mg | INTRAVENOUS | Status: DC | PRN
  Administered 2019-06-29: 2 mg via INTRAVENOUS
  Filled 2019-06-28: qty 1

## 2019-06-28 MED ORDER — ACETAMINOPHEN 500 MG PO TABS
1000.0000 mg | ORAL_TABLET | Freq: Four times a day (QID) | ORAL | Status: DC
  Administered 2019-06-28 – 2019-06-30 (×7): 1000 mg via ORAL
  Filled 2019-06-28 (×9): qty 2

## 2019-06-28 NOTE — ED Notes (Signed)
This RN to pt's bedside to answer call bell. Upon assessment of IV fluids, bag is still mostly full. Asked pt if she has been messing with IV pole to which "I hadn't touched it except to press this to make it stop beeping". Instructed pt to not touch machine and get nurse if it beeps. Fluids restarted.

## 2019-06-28 NOTE — ED Triage Notes (Signed)
RLQ pain that started Monday. Had outpatient CT scan today and pt states that she was told that she has appendicitis. Arrives with PIV in place.

## 2019-06-28 NOTE — ED Notes (Signed)
PT c/o IV location bothering her. This RN offered to take a look and potentially start a different IV for pt b/c pt saying she will rip IV out. Upon looking RN looked at veins in hand and daughter of pt states it cannot go in her hand either. PT now saying IV is fine and does not want different IV.

## 2019-06-28 NOTE — Anesthesia Preprocedure Evaluation (Addendum)
Anesthesia Evaluation  Patient identified by MRN, date of birth, ID band Patient awake    Reviewed: Allergy & Precautions, H&P , NPO status , reviewed documented beta blocker date and time   Airway Mallampati: III  TM Distance: >3 FB Neck ROM: full    Dental  (+) Upper Dentures, Partial Lower, Chipped   Pulmonary asthma , sleep apnea , COPD, Current Smoker,     + decreased breath sounds      Cardiovascular hypertension, Normal cardiovascular exam  ECHO 04/2019 1. The left ventricle has normal systolic function, with an ejection fraction of 55-60%. The cavity size was normal. There is mild concentric left ventricular hypertrophy. Left ventricular diastolic Doppler parameters are consistent with impaired  relaxation.  2. The right ventricle has normal systolic function. The cavity was normal. There is no increase in right ventricular wall thickness. Right ventricular systolic pressure could not be assessed.  3. The mitral valve is grossly normal.  4. The tricuspid valve is grossly normal.  5. The aortic valve is abnormal. Mild calcification of the aortic valve. No stenosis of the aortic valve.  6. No intracardiac thrombi or masses were visualized.   Neuro/Psych PSYCHIATRIC DISORDERS Depression  Neuromuscular disease CVA    GI/Hepatic   Endo/Other    Renal/GU      Musculoskeletal  (+) Arthritis ,   Abdominal   Peds  Hematology   Anesthesia Other Findings Past Medical History: No date: Asthma No date: COPD (chronic obstructive pulmonary disease) ([place]) 04/19/2019: CVA (cerebral vascular accident) ([place]) No date: Depression No date: Hypertension No date: Sleep apnea No date: Tobacco abuse   Reproductive/Obstetrics                           Anesthesia Physical Anesthesia Plan  ASA: III and emergent  Anesthesia Plan: General   Post-op Pain Management:    Induction: Intravenous  PONV Risk  Score and Plan: 3 and Ondansetron, Treatment may vary due to age or medical condition, Midazolam and Dexamethasone  Airway Management Planned: Oral ETT  Additional Equipment:   Intra-op Plan:   Post-operative Plan: Extubation in OR  Informed Consent: I have reviewed the patients History and Physical, chart, labs and discussed the procedure including the risks, benefits and alternatives for the proposed anesthesia with the patient or authorized representative who has indicated his/her understanding and acceptance.     Dental Advisory Given  Plan Discussed with: CRNA  Anesthesia Plan Comments:         Anesthesia Quick Evaluation

## 2019-06-28 NOTE — ED Provider Notes (Signed)
[facility] Emergency Department Provider Note    First MD Initiated Contact with Patient 06/28/19 1616     (approximate)  I have reviewed the triage vital signs and the nursing notes.   HISTORY  Chief Complaint Abdominal Pain    HPI Traci Cross is a 58 y.o. female status post cholecystectomy presents the ER for 5 days of progressively worsening right lower quadrant abdominal pain as well as right upper quadrant pain.  Is not had any measured fevers but has had some chills.  States the pain is mild to moderate.  Had outpatient CT imaging today which showed evidence of acute appendicitis.  She presented to the ER for further evaluation and management.    Past Medical History:  Diagnosis Date  . Asthma   . COPD (chronic obstructive pulmonary disease) ([place])   . CVA (cerebral vascular accident) ([place]) 04/19/2019  . Depression   . Hypertension   . Sleep apnea   . Tobacco abuse    Family History  Problem Relation Age of Onset  . Heart failure Mother   . Stroke Mother   . Alzheimer's disease Mother   . Heart failure Father   . Stroke Father   . Dementia Father    Past Surgical History:  Procedure Laterality Date  . CHOLECYSTECTOMY    . TONSILLECTOMY     Patient Active Problem List   Diagnosis Date Noted  . 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 Medically noncompliant 04/28/2019  . Current smoker 04/28/2019  . Other chronic pain 04/28/2019  . Acute CVA (cerebrovascular accident) ([place]) 04/19/2019  . Bilateral carpal tunnel syndrome 12/08/2016  . Goiter 08/12/2015  . COPD (chronic obstructive pulmonary disease) ([place]) 04/15/2015  . Asthma 04/15/2015  . Accelerated hypertension 03/26/2015  . Essential hypertension 03/26/2015  . Tobacco abuse 03/25/2015  . COPD exacerbation ([place]) 03/25/2015  . Acute respiratory failure ([place]) 03/25/2015      Prior to Admission medications   Medication Sig Start Date End Date Taking? Authorizing Provider   acetaminophen (TYLENOL) 500 MG tablet Take 1,000 mg by mouth as needed.   Yes [provider]  albuterol (VENTOLIN HFA) 108 (90 Base) MCG/ACT inhaler Inhale 2 puffs into the lungs every 6 (six) hours as needed for wheezing or shortness of breath. 03/27/19  Yes Kendell Bane, NP  aspirin EC 81 MG tablet Take 1 tablet (81 mg total) by mouth daily. 04/19/19 04/18/20 Yes Duffy Bruce, MD  atorvastatin (LIPITOR) 40 MG tablet Take 1 tablet (40 mg total) by mouth daily at 6 PM. 04/20/19  Yes Demetrios Loll, MD  budesonide-formoterol [facility]) 160-4.5 MCG/ACT inhaler Inhale 2 puffs into the lungs 2 (two) times daily. 04/30/19  Yes Scarboro, Audie Clear, NP  celecoxib (CELEBREX) 100 MG capsule Take 100 mg by mouth 2 (two) times daily.   Yes [provider]  hydrochlorothiazide (HYDRODIURIL) 25 MG tablet Take 1 tablet (25 mg total) by mouth daily. 07/02/18  Yes Scarboro, Audie Clear, NP  naproxen sodium (ALEVE) 220 MG tablet Take 220 mg by mouth 2 (two) times daily as needed.   Yes [provider]    Allergies Levaquin [levofloxacin], Codeine, Hydrocodone, and Penicillins    Social History Social History   Tobacco Use  . Smoking status: Current Every Day Smoker    Packs/day: 0.25    Years: 35.00    Pack years: 8.75    Types: Cigarettes  . Smokeless tobacco: Never Used  Substance Use Topics  . Alcohol use:  No    Alcohol/week: 0.0 standard drinks  . Drug use: No    Review of Systems Patient denies headaches, rhinorrhea, blurry vision, numbness, shortness of breath, chest pain, edema, cough, abdominal pain, nausea, vomiting, diarrhea, dysuria, fevers, rashes or hallucinations unless otherwise stated above in HPI. ____________________________________________   PHYSICAL EXAM:  VITAL SIGNS: Vitals:   06/28/19 1607  BP: 131/86  Pulse: 99  Resp: 18  Temp: 98.6 F (37 C)  SpO2: 96%    Constitutional: Alert and oriented.  Eyes: Conjunctivae are normal.  Head:  Atraumatic. Nose: No congestion/rhinnorhea. Mouth/Throat: Mucous membranes are moist.   Neck: No stridor. Painless ROM.  Cardiovascular: Normal rate, regular rhythm. Grossly normal heart sounds.  Good peripheral circulation. Respiratory: Normal respiratory effort.  No retractions. Lungs CTAB. Gastrointestinal: Soft tenderness palpation of the right lower quadrant and right upper quadrant no distention. No abdominal bruits. No CVA tenderness. Genitourinary:  Musculoskeletal: No lower extremity tenderness nor edema.  No joint effusions. Neurologic:  Normal speech and language. No gross focal neurologic deficits are appreciated. No facial droop Skin:  Skin is warm, dry and intact. No rash noted. Psychiatric: Mood and affect are normal. Speech and behavior are normal.  ____________________________________________   LABS (all labs ordered are listed, but only abnormal results are displayed)  Results for orders placed or performed during the hospital encounter of 06/28/19 (from the past 24 hour(s))  CBC with Differential/Platelet     Status: Abnormal   Collection Time: 06/28/19  4:14 PM  Result Value Ref Range   WBC 12.2 (H) 4.0 - 10.5 K/uL   RBC 4.81 3.87 - 5.11 MIL/uL   Hemoglobin 14.3 12.0 - 15.0 g/dL   HCT 16.141.3 09.636.0 - 04.546.0 %   MCV 85.9 80.0 - 100.0 fL   MCH 29.7 26.0 - 34.0 pg   MCHC 34.6 30.0 - 36.0 g/dL   RDW 40.912.4 81.111.5 - 91.415.5 %   Platelets 240 150 - 400 K/uL   nRBC 0.0 0.0 - 0.2 %   Neutrophils Relative % 78 %   Neutro Abs 9.5 (H) 1.7 - 7.7 K/uL   Lymphocytes Relative 14 %   Lymphs Abs 1.7 0.7 - 4.0 K/uL   Monocytes Relative 7 %   Monocytes Absolute 0.8 0.1 - 1.0 K/uL   Eosinophils Relative 1 %   Eosinophils Absolute 0.2 0.0 - 0.5 K/uL   Basophils Relative 0 %   Basophils Absolute 0.1 0.0 - 0.1 K/uL   Immature Granulocytes 0 %   Abs Immature Granulocytes 0.04 0.00 - 0.07 K/uL  Comprehensive metabolic panel     Status: Abnormal   Collection Time: 06/28/19  4:14 PM  Result  Value Ref Range   Sodium 134 (L) 135 - 145 mmol/L   Potassium 3.4 (L) 3.5 - 5.1 mmol/L   Chloride 95 (L) 98 - 111 mmol/L   CO2 27 22 - 32 mmol/L   Glucose, Bld 103 (H) 70 - 99 mg/dL   BUN 19 6 - 20 mg/dL   Creatinine, Ser 7.820.87 0.44 - 1.00 mg/dL   Calcium 8.7 (L) 8.9 - 10.3 mg/dL   Total Protein 7.5 6.5 - 8.1 g/dL   Albumin 3.8 3.5 - 5.0 g/dL   AST 17 15 - 41 U/L   ALT 11 0 - 44 U/L   Alkaline Phosphatase 83 38 - 126 U/L   Total Bilirubin 0.7 0.3 - 1.2 mg/dL   GFR calc non Af Amer >60 >60 mL/min   GFR calc Af Amer >60 >  60 mL/min   Anion gap 12 5 - 15  Lipase, blood     Status: None   Collection Time: 06/28/19  4:14 PM  Result Value Ref Range   Lipase 28 11 - 51 U/L  SARS Coronavirus 2 [facility] order, Performed in [facility] hospital lab) Nasopharyngeal Nasopharyngeal Swab     Status: None   Collection Time: 06/28/19  4:27 PM   Specimen: Nasopharyngeal Swab  Result Value Ref Range   SARS Coronavirus 2 NEGATIVE NEGATIVE   ____________________________________________ ____________________________________________  RADIOLOGY   ____________________________________________   PROCEDURES  Procedure(s) performed:  Procedures    Critical Care performed: no ____________________________________________   INITIAL IMPRESSION / ASSESSMENT AND PLAN / ED COURSE  Pertinent labs & imaging results that were available during my care of the patient were reviewed by me and considered in my medical decision making (see chart for details).   DDX: Appendicitis, choledocholithiasis, enteritis, colitis, UTI  Traci Cross is a 58 y.o. who presents to the ED with symptoms as described above.  She is afebrile nontoxic-appearing does have mild leukocytosis and CT imaging showing evidence of acute appendicitis.  Incidentally noted to have choledocholithiasis as well.  Will add on hepatic panel.  Discussed case with Dr. Elenor Legato of general surgery.  Will give IV Zosyn.  She is a listed penicillin  allergy but states that she is able to tolerate amoxicillin and is not not certain where that listed allergy came from.     The patient was evaluated in Emergency Department today for the symptoms described in the history of present illness. He/she was evaluated in the context of the global COVID-19 pandemic, which necessitated consideration that the patient might be at risk for infection with the SARS-CoV-2 virus that causes COVID-19. Institutional protocols and algorithms that pertain to the evaluation of patients at risk for COVID-19 are in a state of rapid change based on information released by regulatory bodies including the CDC and federal and state organizations. These policies and algorithms were followed during the patient's care in the ED.  As part of my medical decision making, I reviewed the following data within the electronic MEDICAL RECORD NUMBER Nursing notes reviewed and incorporated, Labs reviewed, notes from prior ED visits and  Controlled Substance Database   ____________________________________________   FINAL CLINICAL IMPRESSION(S) / ED DIAGNOSES  Final diagnoses:  Other acute appendicitis  Choledocholithiasis      NEW MEDICATIONS STARTED DURING THIS VISIT:  New Prescriptions   No medications on file     Note:  This document was prepared using Dragon voice recognition software and may include unintentional dictation errors.    Willy Eddy, MD 06/28/19 1733

## 2019-06-28 NOTE — H&P (Signed)
Patient ID: Traci Cross, female   DOB: [birth year], 58 y.o.   MRN: [id number]  HPI Traci Cross is a 58 y.o. female 5 to 6-day history of abdominal pain.  She reports that the pain was initially periumbilical now has localized to her lateral right lower quadrant.  She is also endorses some decreased appetite.  No fevers no chills no evidence of biliary obstruction.  She did have a history of cholecystectomy more than 20 years ago.  No records are available.  Does have a history of a stroke and is on daily aspirin.  She does have COPD.  Work-up in the ER included a CT scan of the abdomen and pelvis that I have personally reviewed showing evidence of acute appendicitis without perforation no free air there is also incidental findings consistent with choledocholithiasis.  Her white count is a little slightly elevated and her LFTs are completely normal.  Have also discussed with Dr. Allen Norris regarding ERCP and this time the priority is to get acute illness which is the appendicitis therefore that takes priority  HPI  Past Medical History:  Diagnosis Date  . Asthma   . COPD (chronic obstructive pulmonary disease) ([place])   . CVA (cerebral vascular accident) ([place]) 04/19/2019  . Depression   . Hypertension   . Sleep apnea   . Tobacco abuse     Past Surgical History:  Procedure Laterality Date  . CHOLECYSTECTOMY    . TONSILLECTOMY      Family History  Problem Relation Age of Onset  . Heart failure Mother   . Stroke Mother   . Alzheimer's disease Mother   . Heart failure Father   . Stroke Father   . Dementia Father     Social History Social History   Tobacco Use  . Smoking status: Current Every Day Smoker    Packs/day: 0.25    Years: 35.00    Pack years: 8.75    Types: Cigarettes  . Smokeless tobacco: Never Used  Substance Use Topics  . Alcohol use: No    Alcohol/week: 0.0 standard drinks  . Drug use: No    Allergies  Allergen Reactions  . Levaquin [Levofloxacin] Hives    Dr  notified.   . Codeine Hives  . Hydrocodone Hives  . Penicillins Hives    Current Facility-Administered Medications  Medication Dose Route Frequency Provider Last Rate Last Dose  . morphine 4 MG/ML injection 4 mg  4 mg Intravenous Q3H PRN Merlyn Lot, MD   4 mg at 06/28/19 1621  . sodium chloride 0.9 % bolus 500 mL  500 mL Intravenous Once [place] Peifer, Marjory Lies, MD       Current Outpatient Medications  Medication Sig Dispense Refill  . acetaminophen (TYLENOL) 500 MG tablet Take 1,000 mg by mouth as needed.    Marland Kitchen albuterol (VENTOLIN HFA) 108 (90 Base) MCG/ACT inhaler Inhale 2 puffs into the lungs every 6 (six) hours as needed for wheezing or shortness of breath. 3 Inhaler 3  . aspirin EC 81 MG tablet Take 1 tablet (81 mg total) by mouth daily. 30 tablet 11  . atorvastatin (LIPITOR) 40 MG tablet Take 1 tablet (40 mg total) by mouth daily at 6 PM. 30 tablet 1  . budesonide-formoterol (SYMBICORT) 160-4.5 MCG/ACT inhaler Inhale 2 puffs into the lungs 2 (two) times daily. 1 Inhaler 3  . celecoxib (CELEBREX) 100 MG capsule Take 100 mg by mouth 2 (two) times daily.    . hydrochlorothiazide (HYDRODIURIL) 25 MG tablet Take  1 tablet (25 mg total) by mouth daily. 90 tablet 3  . naproxen sodium (ALEVE) 220 MG tablet Take 220 mg by mouth 2 (two) times daily as needed.       Review of Systems Full ROS  was asked and was negative except for the information on the HPI  Physical Exam Blood pressure 131/86, pulse 99, temperature 98.6 F (37 C), temperature source Oral, resp. rate 18, height 5\' 7"  (1.702 m), weight 90.7 kg, SpO2 96 %. CONSTITUTIONAL: NAD EYES: Pupils are equal, round, and reactive to light, Sclera are non-icteric. EARS, NOSE, MOUTH AND THROAT: The oropharynx is clear. The oral mucosa is pink and moist. Hearing is intact to voice. LYMPH NODES:  Lymph nodes in the neck are normal. RESPIRATORY:  Lungs are clear. There is normal respiratory effort, with equal breath sounds bilaterally, and  without pathologic use of accessory muscles. CARDIOVASCULAR: Heart is regular without murmurs, gallops, or rubs. GI: The abdomen is  Soft, TTP RLQ. No overt peritonitis, evidence of reducible small ventral hernia. GU: Rectal deferred.   MUSCULOSKELETAL: Normal muscle strength and tone. No cyanosis or edema.   SKIN: Turgor is good and there are no pathologic skin lesions or ulcers. NEUROLOGIC: Motor and sensation is grossly normal. Cranial nerves are grossly intact. PSYCH:  Oriented to person, place and time. Affect is normal.  Data Reviewed  I have personally reviewed the patient's imaging, laboratory findings and medical records.    Assessment/Plan Acute appendicitis in a 58 year old female with incidental choledocholithiasis but no evidence of cholangitis or true biliary obstruction.  Discussed with the patient in detail and she definitely is no need to be admitted for IV antibiotic and appendectomy.  I have discussed with the OR and currently things are pretty busy and more than likely is been have to wait for several hours and might not be able to go tonight.   The risks, benefits, complications, treatment options, and expected outcomes were discussed with the patient.   Also discussed continuing to the operating room for Laparoscopic Appendectomy.  The possibilities of  bleeding, recurrent infection, perforation of viscus, finding a normal appendix, the need for additional procedures, failure to diagnose a condition, conversion to open procedure and creating a complication requiring transfusion or further operations were discussed. The patient was given the opportunity to ask questions and have them answered.  Patient would like to proceed with Laparoscopic Appendectomy and consent was obtained.  Some point time she will need to get an ERCP either on the outpatient setting or what she is hospitalized.   Sterling Bigiego Genette Huertas, MD FACS General Surgeon 06/28/2019, 6:20 PM

## 2019-06-28 NOTE — ED Notes (Signed)
ED TO INPATIENT HANDOFF REPORT  ED Nurse Name and Phone #: Isa Hitz [phone]  S Name/Age/Gender Traci Cross 58 y.o. female Room/Bed: ED10A/ED10A  Code Status   Code Status: Prior  Home/SNF/Other Home Patient oriented to: self, place, time and situation Is this baseline? No   Triage Complete: Triage complete  Chief Complaint Appendicitis  Triage Note RLQ pain that started Monday. Had outpatient CT scan today and pt states that she was told that she has appendicitis. Arrives with PIV in place.   Allergies Allergies  Allergen Reactions  . Levaquin [Levofloxacin] Hives    Dr notified.   . Codeine Hives  . Hydrocodone Hives  . Penicillins Hives    Level of Care/Admitting Diagnosis ED Disposition    ED Disposition Condition Comment   Admit  [facility]ospital [facility] [[id number]]  Level of Care: Med-Surg [16]  Covid Evaluation: Confirmed COVID Negative  Diagnosis: Appendicitis [161096][[id number]]  Admitting Physician: Leafy RoBON, DIEGO F [[id number]]  Attending Physician: Leafy RoBON, DIEGO F [[id number]]  Estimated length of stay: 3 - 4 days  Certification:: I certify this patient will need inpatient services for at least 2 midnights  PT Class (Do Not Modify): Inpatient [101]  PT Acc Code (Do Not Modify): Private [1]       B Medical/Surgery History Past Medical History:  Diagnosis Date  . Asthma   . COPD (chronic obstructive pulmonary disease) ([facility])   . CVA (cerebral vascular accident) ([facility]) 04/19/2019  . Depression   . Hypertension   . Sleep apnea   . Tobacco abuse    Past Surgical History:  Procedure Laterality Date  . CHOLECYSTECTOMY    . TONSILLECTOMY       A IV Location/Drains/Wounds Patient Lines/Drains/Airways Status   Active Line/Drains/Airways    Name:   Placement date:   Placement time:   Site:   Days:   Peripheral IV 04/19/19 Right Forearm   04/19/19    2232    Forearm   70   Peripheral IV 06/28/19 Left Antecubital   06/28/19    1448    Antecubital    less than 1          Intake/Output Last 24 hours No intake or output data in the 24 hours ending 06/28/19 1851  Labs/Imaging Results for orders placed or performed during the hospital encounter of 06/28/19 (from the past 48 hour(s))  CBC with Differential/Platelet     Status: Abnormal   Collection Time: 06/28/19  4:14 PM  Result Value Ref Range   WBC 12.2 (H) 4.0 - 10.5 K/uL   RBC 4.81 3.87 - 5.11 MIL/uL   Hemoglobin 14.3 12.0 - 15.0 g/dL   HCT 29.541.3 62.136.0 - 30.846.0 %   MCV 85.9 80.0 - 100.0 fL   MCH 29.7 26.0 - 34.0 pg   MCHC 34.6 30.0 - 36.0 g/dL   RDW 65.712.4 84.611.5 - 96.215.5 %   Platelets 240 150 - 400 K/uL   nRBC 0.0 0.0 - 0.2 %   Neutrophils Relative % 78 %   Neutro Abs 9.5 (H) 1.7 - 7.7 K/uL   Lymphocytes Relative 14 %   Lymphs Abs 1.7 0.7 - 4.0 K/uL   Monocytes Relative 7 %   Monocytes Absolute 0.8 0.1 - 1.0 K/uL   Eosinophils Relative 1 %   Eosinophils Absolute 0.2 0.0 - 0.5 K/uL   Basophils Relative 0 %   Basophils Absolute 0.1 0.0 - 0.1 K/uL   Immature Granulocytes 0 %   Abs Immature  Granulocytes 0.04 0.00 - 0.07 K/uL    Comment: Performed at [facility], [address] [place], [place] [address]  Comprehensive metabolic panel     Status: Abnormal   Collection Time: 06/28/19  4:14 PM  Result Value Ref Range   Sodium 134 (L) 135 - 145 mmol/L   Potassium 3.4 (L) 3.5 - 5.1 mmol/L   Chloride 95 (L) 98 - 111 mmol/L   CO2 27 22 - 32 mmol/L   Glucose, Bld 103 (H) 70 - 99 mg/dL   BUN 19 6 - 20 mg/dL   Creatinine, Ser 0.87 0.44 - 1.00 mg/dL   Calcium 8.7 (L) 8.9 - 10.3 mg/dL   Total Protein 7.5 6.5 - 8.1 g/dL   Albumin 3.8 3.5 - 5.0 g/dL   AST 17 15 - 41 U/L   ALT 11 0 - 44 U/L   Alkaline Phosphatase 83 38 - 126 U/L   Total Bilirubin 0.7 0.3 - 1.2 mg/dL   GFR calc non Af Amer >60 >60 mL/min   GFR calc Af Amer >60 >60 mL/min   Anion gap 12 5 - 15    Comment: Performed at [facility], [address] [place], [place] [address]  Lipase, blood      Status: None   Collection Time: 06/28/19  4:14 PM  Result Value Ref Range   Lipase 28 11 - 51 U/L    Comment: Performed at [facility], [address] [place], [place] [address]  SARS Coronavirus 2 [facility] order, Performed in [facility] hospital lab) Nasopharyngeal Nasopharyngeal Swab     Status: None   Collection Time: 06/28/19  4:27 PM   Specimen: Nasopharyngeal Swab  Result Value Ref Range   SARS Coronavirus 2 NEGATIVE NEGATIVE    Comment: (NOTE) If result is NEGATIVE SARS-CoV-2 target nucleic acids are NOT DETECTED. The SARS-CoV-2 RNA is generally detectable in upper and lower  respiratory specimens during the acute phase of infection. The lowest  concentration of SARS-CoV-2 viral copies this assay can detect is 250  copies / mL. A negative result does not preclude SARS-CoV-2 infection  and should not be used as the sole basis for treatment or other  patient management decisions.  A negative result may occur with  improper specimen collection / handling, submission of specimen other  than nasopharyngeal swab, presence of viral mutation(s) within the  areas targeted by this assay, and inadequate number of viral copies  (<250 copies / mL). A negative result must be combined with clinical  observations, patient history, and epidemiological information. If result is POSITIVE SARS-CoV-2 target nucleic acids are DETECTED. The SARS-CoV-2 RNA is generally detectable in upper and lower  respiratory specimens dur ing the acute phase of infection.  Positive  results are indicative of active infection with SARS-CoV-2.  Clinical  correlation with patient history and other diagnostic information is  necessary to determine patient infection status.  Positive results do  not rule out bacterial infection or co-infection with other viruses. If result is PRESUMPTIVE POSTIVE SARS-CoV-2 nucleic acids MAY BE PRESENT.   A presumptive positive result was obtained on the submitted  specimen  and confirmed on repeat testing.  While 2019 novel coronavirus  (SARS-CoV-2) nucleic acids may be present in the submitted sample  additional confirmatory testing may be necessary for epidemiological  and / or clinical management purposes  to differentiate between  SARS-CoV-2 and other Sarbecovirus currently known to infect humans.  If clinically indicated additional testing with an  alternate test  methodology [phone]) is advised. The SARS-CoV-2 RNA is generally  detectable in upper and lower respiratory sp ecimens during the acute  phase of infection. The expected result is Negative. Fact Sheet for Patients:  BoilerBrush.com.cy Fact Sheet for Healthcare Providers: https://pope.com/ This test is not yet approved or cleared by the [place] FDA and has been authorized for detection and/or diagnosis of SARS-CoV-2 by FDA under an Emergency Use Authorization (EUA).  This EUA will remain in effect (meaning this test can be used) for the duration of the COVID-19 declaration under Section 564(b)(1) of the Act, 21 U.S.C. section 360bbb-3(b)(1), unless the authorization is terminated or revoked sooner. Performed at [facility], [address] [place], [place] [address]    Ct Abdomen Pelvis W Contrast  Result Date: 06/28/2019 CLINICAL DATA:  58 year old female with acute RIGHT abdominal and pelvic pain for 1 week. EXAM: CT ABDOMEN AND PELVIS WITH CONTRAST TECHNIQUE: Multidetector CT imaging of the abdomen and pelvis was performed using the standard protocol following bolus administration of intravenous contrast. CONTRAST:  OMNIPAQUE IOHEXOL 300 MG/ML  SOLN COMPARISON:  03/31/2018 chest CT FINDINGS: Lower chest: Unremarkable Hepatobiliary: Calcifications and small hyperdensities within the distal CBD near the ampulla noted compatible with choledocholithiasis. Mild CBD dilatation (10 mm) and mild intrahepatic biliary  dilatation identified. A LEFT hepatic cyst is again noted. The patient is status post cholecystectomy. Pancreas: Unremarkable Spleen: UPPER limits normal spleen size noted with a splenic volume of 400 cc. No focal splenic abnormalities are identified. Adrenals/Urinary Tract: The kidneys, adrenal glands and bladder are unremarkable. Stomach/Bowel: An enlarged thick-walled appendix measuring up to 13 mm in diameter with moderate adjacent inflammation is noted compatible with acute appendicitis. There is no evidence of pneumoperitoneum, abscess or bowel obstruction. No other significant bowel abnormalities are identified except for a few scattered colonic diverticula. Vascular/Lymphatic: Aortic atherosclerosis. No enlarged abdominal or pelvic lymph nodes. Reproductive: Uterus and bilateral adnexa are unremarkable. Other: No ascites Musculoskeletal: No acute or suspicious bony abnormalities. IMPRESSION: 1. Acute appendicitis with moderate periappendiceal inflammation. No bowel obstruction, free fluid, abscess or pneumoperitoneum. 2. Distal choledocholithiasis with mild CBD and intrahepatic biliary dilatation. 3. UPPER limits normal spleen size. 4.  Aortic Atherosclerosis (ICD10-I70.0). Critical Value/emergent results were called by telephone at the time of interpretation on 06/28/2019 at 3:30 pm to provider Dr ADAM Juanda Bond , who verbally acknowledged these results. The patient will be transferred to the ER. Electronically Signed   By: Harmon Pier M.D.   On: 06/28/2019 15:34    Pending Labs Unresulted Labs (From admission, onward)    Start     Ordered   Signed and Held  CBC  (heparin)  Once,   R    Comments: Baseline for heparin therapy IF NOT ALREADY DRAWN.  Notify MD if PLT < 100 K.    Signed and Held   Signed and Held  Creatinine, serum  (heparin)  Once,   R    Comments: Baseline for heparin therapy IF NOT ALREADY DRAWN.    Signed and Held   Signed and Held  CBC  Tomorrow morning,   R     Signed and  Held          Vitals/Pain Today's Vitals   06/28/19 1607 06/28/19 1610 06/28/19 1700 06/28/19 1800  BP: 131/86  132/78 127/85  Pulse: 99  79 84  Resp: 18  17 (!) 21  Temp: 98.6 F (37 C)     TempSrc: Oral  SpO2: 96%  99% 100%  Weight:      Height:      PainSc:  9       Isolation Precautions No active isolations  Medications Medications  morphine 4 MG/ML injection 4 mg (4 mg Intravenous Given 06/28/19 1621)  ondansetron (ZOFRAN) injection 4 mg (4 mg Intravenous Given 06/28/19 1622)  sodium chloride 0.9 % bolus 500 mL (500 mLs Intravenous New Bag/Given 06/28/19 1626)  piperacillin-tazobactam (ZOSYN) IVPB 3.375 g (0 g Intravenous Stopped 06/28/19 1720)  diphenhydrAMINE (BENADRYL) injection 12.5 mg (12.5 mg Intravenous Given 06/28/19 1651)  0.9 %  sodium chloride infusion ( Intravenous New Bag/Given 06/28/19 1740)  sodium chloride 0.9 % bolus 500 mL (500 mLs Intravenous Bolus 06/28/19 1816)    Mobility walks Low fall risk   Focused Assessments GI: tender, no NVD   R Recommendations: See Admitting Provider Note  Report given to:   Additional Notes:

## 2019-06-28 NOTE — ED Notes (Signed)
Surgeon at bedside.  

## 2019-06-28 NOTE — ED Notes (Signed)
ED Provider at bedside. 

## 2019-06-29 ENCOUNTER — Inpatient Hospital Stay: Payer: Medicaid Other | Admitting: Anesthesiology

## 2019-06-29 ENCOUNTER — Encounter
Admission: EM | Disposition: A | Discharge status: Home / Self Care | Payer: Self-pay | Source: Home / Self Care | Attending: Surgery

## 2019-06-29 HISTORY — PX: LAPAROSCOPIC APPENDECTOMY: SHX408

## 2019-06-29 LAB — CBC
HCT: 35.2 % — ABNORMAL LOW (ref 36.0–46.0)
Hemoglobin: 11.9 g/dL — ABNORMAL LOW (ref 12.0–15.0)
MCH: 29.7 pg (ref 26.0–34.0)
MCHC: 33.8 g/dL (ref 30.0–36.0)
MCV: 87.8 fL (ref 80.0–100.0)
Platelets: 184 10*3/uL (ref 150–400)
RBC: 4.01 MIL/uL (ref 3.87–5.11)
RDW: 12.4 % (ref 11.5–15.5)
WBC: 5.9 10*3/uL (ref 4.0–10.5)
nRBC: 0 % (ref 0.0–0.2)

## 2019-06-29 SURGERY — APPENDECTOMY, LAPAROSCOPIC
Anesthesia: General

## 2019-06-29 MED ORDER — LACTATED RINGERS IV SOLN
INTRAVENOUS | Status: DC | PRN
  Administered 2019-06-29: 11:00:00 via INTRAVENOUS

## 2019-06-29 MED ORDER — DEXAMETHASONE SODIUM PHOSPHATE 10 MG/ML IJ SOLN
INTRAMUSCULAR | Status: DC | PRN
  Administered 2019-06-29: 10 mg via INTRAVENOUS

## 2019-06-29 MED ORDER — PROPOFOL 10 MG/ML IV BOLUS
INTRAVENOUS | Status: DC | PRN
  Administered 2019-06-29: 180 mg via INTRAVENOUS
  Administered 2019-06-29: 20 mg via INTRAVENOUS

## 2019-06-29 MED ORDER — LIDOCAINE HCL (CARDIAC) PF 100 MG/5ML IV SOSY
PREFILLED_SYRINGE | INTRAVENOUS | Status: DC | PRN
  Administered 2019-06-29: 100 mg via INTRAVENOUS

## 2019-06-29 MED ORDER — FENTANYL CITRATE (PF) 100 MCG/2ML IJ SOLN
INTRAMUSCULAR | Status: DC | PRN
  Administered 2019-06-29 (×2): 50 ug via INTRAVENOUS

## 2019-06-29 MED ORDER — FENTANYL CITRATE (PF) 100 MCG/2ML IJ SOLN
INTRAMUSCULAR | Status: AC
  Administered 2019-06-29: 14:00:00
  Filled 2019-06-29: qty 2

## 2019-06-29 MED ORDER — PROMETHAZINE HCL 25 MG/ML IJ SOLN
INTRAMUSCULAR | Status: AC
  Administered 2019-06-29: 14:00:00
  Filled 2019-06-29: qty 1

## 2019-06-29 MED ORDER — PROMETHAZINE HCL 25 MG/ML IJ SOLN
6.2500 mg | INTRAMUSCULAR | Status: DC | PRN
  Administered 2019-06-29: 12.5 mg via INTRAVENOUS

## 2019-06-29 MED ORDER — BUPIVACAINE HCL (PF) 0.25 % IJ SOLN
INTRAMUSCULAR | Status: AC
  Filled 2019-06-29: qty 30

## 2019-06-29 MED ORDER — MEPERIDINE HCL 50 MG/ML IJ SOLN: 6.2500 mg | INTRAMUSCULAR | Status: DC | PRN

## 2019-06-29 MED ORDER — NICOTINE 14 MG/24HR TD PT24
14.0000 mg | MEDICATED_PATCH | Freq: Every day | TRANSDERMAL | Status: DC
  Administered 2019-06-29 – 2019-06-30 (×2): 14 mg via TRANSDERMAL
  Filled 2019-06-29 (×3): qty 1

## 2019-06-29 MED ORDER — ONDANSETRON HCL 4 MG/2ML IJ SOLN
INTRAMUSCULAR | Status: AC
  Filled 2019-06-29: qty 2

## 2019-06-29 MED ORDER — ONDANSETRON HCL 4 MG/2ML IJ SOLN
INTRAMUSCULAR | Status: DC | PRN
  Administered 2019-06-29: 4 mg via INTRAVENOUS

## 2019-06-29 MED ORDER — SODIUM CHLORIDE FLUSH 0.9 % IV SOLN
INTRAVENOUS | Status: AC
  Filled 2019-06-29: qty 50

## 2019-06-29 MED ORDER — ROCURONIUM BROMIDE 100 MG/10ML IV SOLN
INTRAVENOUS | Status: DC | PRN
  Administered 2019-06-29: 40 mg via INTRAVENOUS
  Administered 2019-06-29: 20 mg via INTRAVENOUS
  Administered 2019-06-29: 10 mg via INTRAVENOUS

## 2019-06-29 MED ORDER — BUPIVACAINE LIPOSOME 1.3 % IJ SUSP
INTRAMUSCULAR | Status: AC
  Filled 2019-06-29: qty 20

## 2019-06-29 MED ORDER — DEXMEDETOMIDINE HCL IN NACL 200 MCG/50ML IV SOLN
INTRAVENOUS | Status: DC | PRN
  Administered 2019-06-29: 12 ug via INTRAVENOUS
  Administered 2019-06-29: 8 ug via INTRAVENOUS

## 2019-06-29 MED ORDER — IPRATROPIUM-ALBUTEROL 0.5-2.5 (3) MG/3ML IN SOLN
RESPIRATORY_TRACT | Status: AC
  Filled 2019-06-29: qty 3

## 2019-06-29 MED ORDER — SUGAMMADEX SODIUM 500 MG/5ML IV SOLN
INTRAVENOUS | Status: AC
  Filled 2019-06-29: qty 5

## 2019-06-29 MED ORDER — PHENYLEPHRINE HCL (PRESSORS) 10 MG/ML IV SOLN
INTRAVENOUS | Status: DC | PRN
  Administered 2019-06-29 (×3): 200 ug via INTRAVENOUS

## 2019-06-29 MED ORDER — FENTANYL CITRATE (PF) 100 MCG/2ML IJ SOLN
INTRAMUSCULAR | Status: AC
  Administered 2019-06-29: 15:00:00
  Filled 2019-06-29: qty 2

## 2019-06-29 MED ORDER — ACETAMINOPHEN 160 MG/5ML PO SOLN
325.0000 mg | ORAL | Status: DC | PRN
  Filled 2019-06-29: qty 20.3

## 2019-06-29 MED ORDER — ROCURONIUM BROMIDE 50 MG/5ML IV SOLN
INTRAVENOUS | Status: AC
  Filled 2019-06-29: qty 1

## 2019-06-29 MED ORDER — SEVOFLURANE IN SOLN
RESPIRATORY_TRACT | Status: AC
  Filled 2019-06-29: qty 250

## 2019-06-29 MED ORDER — ACETAMINOPHEN 10 MG/ML IV SOLN
INTRAVENOUS | Status: AC
  Filled 2019-06-29: qty 100

## 2019-06-29 MED ORDER — BUPIVACAINE-EPINEPHRINE (PF) 0.25% -1:200000 IJ SOLN
INTRAMUSCULAR | Status: AC
  Filled 2019-06-29: qty 30

## 2019-06-29 MED ORDER — DEXAMETHASONE SODIUM PHOSPHATE 10 MG/ML IJ SOLN
INTRAMUSCULAR | Status: AC
  Filled 2019-06-29: qty 1

## 2019-06-29 MED ORDER — LIDOCAINE HCL (PF) 2 % IJ SOLN
INTRAMUSCULAR | Status: AC
  Filled 2019-06-29: qty 10

## 2019-06-29 MED ORDER — ACETAMINOPHEN 10 MG/ML IV SOLN
INTRAVENOUS | Status: DC | PRN
  Administered 2019-06-29: 1000 mg via INTRAVENOUS

## 2019-06-29 MED ORDER — PROPOFOL 10 MG/ML IV BOLUS
INTRAVENOUS | Status: AC
  Filled 2019-06-29: qty 20

## 2019-06-29 MED ORDER — SUGAMMADEX SODIUM 500 MG/5ML IV SOLN
INTRAVENOUS | Status: DC | PRN
  Administered 2019-06-29: 400 mg via INTRAVENOUS

## 2019-06-29 MED ORDER — DEXMEDETOMIDINE HCL IN NACL 80 MCG/20ML IV SOLN
INTRAVENOUS | Status: AC
  Filled 2019-06-29: qty 20

## 2019-06-29 MED ORDER — FENTANYL CITRATE (PF) 100 MCG/2ML IJ SOLN
INTRAMUSCULAR | Status: AC
  Filled 2019-06-29: qty 2

## 2019-06-29 MED ORDER — MIDAZOLAM HCL 2 MG/2ML IJ SOLN
INTRAMUSCULAR | Status: DC | PRN
  Administered 2019-06-29: 2 mg via INTRAVENOUS

## 2019-06-29 MED ORDER — SODIUM CHLORIDE 0.9 % IV SOLN
INTRAVENOUS | Status: DC | PRN
  Administered 2019-06-29: 13:00:00 70 mL

## 2019-06-29 MED ORDER — FENTANYL CITRATE (PF) 100 MCG/2ML IJ SOLN
25.0000 ug | INTRAMUSCULAR | Status: DC | PRN
  Administered 2019-06-29 (×3): 50 ug via INTRAVENOUS

## 2019-06-29 MED ORDER — MIDAZOLAM HCL 2 MG/2ML IJ SOLN
INTRAMUSCULAR | Status: AC
  Filled 2019-06-29: qty 2

## 2019-06-29 MED ORDER — SODIUM CHLORIDE FLUSH 0.9 % IV SOLN
INTRAVENOUS | Status: AC
  Administered 2019-06-29: 14:00:00
  Filled 2019-06-29: qty 10

## 2019-06-29 MED ORDER — IPRATROPIUM-ALBUTEROL 0.5-2.5 (3) MG/3ML IN SOLN
3.0000 mL | Freq: Once | RESPIRATORY_TRACT | Status: AC
  Administered 2019-06-29: 3 mL via RESPIRATORY_TRACT

## 2019-06-29 MED ORDER — DIPHENHYDRAMINE HCL 50 MG/ML IJ SOLN
25.0000 mg | Freq: Once | INTRAMUSCULAR | Status: AC
  Administered 2019-06-29: 25 mg via INTRAVENOUS
  Filled 2019-06-29: qty 1

## 2019-06-29 MED ORDER — BUPIVACAINE HCL (PF) 0.25 % IJ SOLN
INTRAMUSCULAR | Status: DC | PRN
  Administered 2019-06-29: 30 mL

## 2019-06-29 MED ORDER — ACETAMINOPHEN 325 MG PO TABS: 325.0000 mg | ORAL_TABLET | ORAL | Status: DC | PRN

## 2019-06-29 SURGICAL SUPPLY — 52 items
APL PRP STRL LF DISP 70% ISPRP (MISCELLANEOUS) ×1
APPLIER CLIP 5 13 M/L LIGAMAX5 (MISCELLANEOUS) ×3
BAG SPEC RTRVL LRG 6X4 10 (ENDOMECHANICALS) ×2
BLADE CLIPPER SURG (BLADE) ×3 IMPLANT
CANISTER SUCT 1200ML W/VALVE (MISCELLANEOUS) ×3 IMPLANT
CHLORAPREP W/TINT 26 (MISCELLANEOUS) ×3 IMPLANT
CLIP APPLIE 5 13 M/L LIGAMAX5 (MISCELLANEOUS) ×1 IMPLANT
COVER WAND RF STERILE (DRAPES) ×3 IMPLANT
CUTTER FLEX LINEAR 45M (STAPLE) ×3 IMPLANT
DERMABOND ADVANCED (GAUZE/BANDAGES/DRESSINGS) ×2
DERMABOND ADVANCED .7 DNX12 (GAUZE/BANDAGES/DRESSINGS) ×1 IMPLANT
DRAIN CHANNEL JP 15F RND 16 (MISCELLANEOUS) ×3 IMPLANT
DRAPE INCISE IOBAN 66X45 STRL (DRAPES) ×3 IMPLANT
DRSG OPSITE POSTOP 3X4 (GAUZE/BANDAGES/DRESSINGS) ×3 IMPLANT
DRSG OPSITE POSTOP 4X6 (GAUZE/BANDAGES/DRESSINGS) ×3 IMPLANT
ELECT CAUTERY BLADE 6.4 (BLADE) ×3 IMPLANT
ELECT REM PT RETURN 9FT ADLT (ELECTROSURGICAL) ×3
ELECTRODE REM PT RTRN 9FT ADLT (ELECTROSURGICAL) ×1 IMPLANT
GLOVE BIO SURGEON STRL SZ7 (GLOVE) ×6 IMPLANT
GOWN STRL REUS W/ TWL LRG LVL3 (GOWN DISPOSABLE) ×2 IMPLANT
GOWN STRL REUS W/TWL LRG LVL3 (GOWN DISPOSABLE) ×6
IRRIGATION STRYKERFLOW (MISCELLANEOUS) ×1 IMPLANT
IRRIGATOR STRYKERFLOW (MISCELLANEOUS) ×3
IV NS 1000ML (IV SOLUTION) ×2
IV NS 1000ML BAXH (IV SOLUTION) ×1 IMPLANT
NEEDLE HYPO 22GX1.5 SAFETY (NEEDLE) ×9 IMPLANT
NS IRRIG 500ML POUR BTL (IV SOLUTION) ×3 IMPLANT
PACK LAP CHOLECYSTECTOMY (MISCELLANEOUS) ×3 IMPLANT
PENCIL ELECTRO HAND CTR (MISCELLANEOUS) ×3 IMPLANT
POUCH SPECIMEN RETRIEVAL 10MM (ENDOMECHANICALS) ×6 IMPLANT
RELOAD 45 VASCULAR/THIN (ENDOMECHANICALS) IMPLANT
RELOAD STAPLE TA45 3.5 REG BLU (ENDOMECHANICALS) ×12 IMPLANT
SCISSORS METZENBAUM CVD 33 (INSTRUMENTS) IMPLANT
SHEARS HARMONIC ACE PLUS 36CM (ENDOMECHANICALS) ×3 IMPLANT
SLEEVE ENDOPATH XCEL 5M (ENDOMECHANICALS) ×3 IMPLANT
SPONGE DRAIN TRACH 4X4 STRL 2S (GAUZE/BANDAGES/DRESSINGS) ×3 IMPLANT
SPONGE LAP 18X18 RF (DISPOSABLE) ×3 IMPLANT
STAPLER SKIN PROX 35W (STAPLE) ×3 IMPLANT
SUCT RESERVOIR 100CC (MISCELLANEOUS) ×3 IMPLANT
SUT ETHILON 3-0 FS-10 30 BLK (SUTURE) ×3
SUT MNCRL AB 4-0 PS2 18 (SUTURE) ×3 IMPLANT
SUT PDS AB 0 CT1 27 (SUTURE) ×3 IMPLANT
SUT VICRYL 0 AB UR-6 (SUTURE) ×6 IMPLANT
SUTURE EHLN 3-0 FS-10 30 BLK (SUTURE) ×1 IMPLANT
SYR 20ML LL LF (SYRINGE) ×3 IMPLANT
SYR 30ML LL (SYRINGE) ×3 IMPLANT
SYS LAPSCP GELPORT 120MM (MISCELLANEOUS) ×3
SYSTEM LAPSCP GELPORT 120MM (MISCELLANEOUS) ×1 IMPLANT
TRAY FOLEY MTR SLVR 16FR STAT (SET/KITS/TRAYS/PACK) IMPLANT
TROCAR XCEL BLUNT TIP 100MML (ENDOMECHANICALS) ×3 IMPLANT
TROCAR XCEL NON-BLD 5MMX100MML (ENDOMECHANICALS) ×6 IMPLANT
TUBING EVAC SMOKE HEATED PNEUM (TUBING) ×3 IMPLANT

## 2019-06-29 NOTE — Progress Notes (Signed)
Patient awaked from sleep, states she still has pain, and rates 8/10.  Medication given as ordered.  Patient drifted back off to sleep.  Will continue to monitor.

## 2019-06-29 NOTE — Anesthesia Post-op Follow-up Note (Signed)
Anesthesia QCDR form completed.        

## 2019-06-29 NOTE — Progress Notes (Signed)
15 minute call to floor. 

## 2019-06-29 NOTE — Progress Notes (Signed)
Patient resting peacefully, snoring.

## 2019-06-29 NOTE — Progress Notes (Signed)
Preoperative Review   Patient is met in the preoperatively. The history is reviewed in the chart and with the patient. I personally reviewed the options and rationale as well as the risks of this procedure that have been previously discussed with the patient. All questions asked by the patient and/or family were answered to their satisfaction.  Patient agrees to proceed with this procedure at this time.  Diego Pabon M.D. FACS   

## 2019-06-29 NOTE — Transfer of Care (Signed)
Anesthesia Post Note  Patient: Traci Cross  Procedure(s) Performed: Procedure(s) (LRB): APPENDECTOMY LAPAROSCOPIC cecectomy (N/A)  Anesthesia type: General  Patient location: ICU  Post pain: Pain level controlled  Post assessment: Post-op Vital signs reviewed  Last Vitals:  Vitals:   06/29/19 0451 06/29/19 1322  BP: 98/73 (!) 147/94  Pulse: 69 88  Resp: 18 18  Temp: 36.8 C (!) 36.3 C  SpO2: 96% 98%    Post vital signs: stable  Level of consciousness: Patient remains intubated per anesthesia plan  Complications: No apparent anesthesia complications

## 2019-06-29 NOTE — Progress Notes (Signed)
Dr Dahlia Byes notified patients complaints of itching due to possibly flagyl, no rash or hives noted, orders given

## 2019-06-29 NOTE — Anesthesia Procedure Notes (Signed)
Procedure Name: Intubation Date/Time: 06/29/2019 11:36 AM Performed by: Doreen Salvage, CRNA Pre-anesthesia Checklist: Patient identified, Patient being monitored, Timeout performed, Emergency Drugs available and Suction available Patient Re-evaluated:Patient Re-evaluated prior to induction Oxygen Delivery Method: Circle system utilized Preoxygenation: Pre-oxygenation with 100% oxygen Induction Type: IV induction Ventilation: Mask ventilation without difficulty and Oral airway inserted - appropriate to patient size Laryngoscope Size: Mac, 3 and McGraph Grade View: Grade I Tube type: Oral Tube size: 7.0 mm Number of attempts: 1 Airway Equipment and Method: Stylet Placement Confirmation: ETT inserted through vocal cords under direct vision,  positive ETCO2 and breath sounds checked- equal and bilateral Secured at: 20 cm Tube secured with: Tape Dental Injury: Teeth and Oropharynx as per pre-operative assessment

## 2019-06-29 NOTE — Progress Notes (Signed)
Patient resting comfortably on her right side, patient on and off snoring, no complaints or distress noted.  Will continue To monitor patient.

## 2019-06-29 NOTE — Op Note (Signed)
laparoscopic cecectomy  [name] Date of operation:  06/29/2019  Indications: The patient presented with a history of  abdominal pain. Workup has revealed findings consistent with acute appendicitis.  Pre-operative Diagnosis: Acute appendicitis without mention of peritonitis  Post-operative Diagnosis: Same  Surgeon: Caroleen Hamman, MD, FACS  Anesthesia: General with endotracheal tube  Findings: Necrotic appendix with contained perforation near the base. Phlegmon within the Right cecum  Estimated Blood Loss: 20cc         Specimens: appendix         Complications:  none  Procedure Details  The patient was seen again in the preop area. The options of surgery versus observation were reviewed with the patient and/or family. The risks of bleeding, infection, recurrence of symptoms, negative laparoscopy, potential for an open procedure, bowel injury, abscess or infection, were all reviewed as well. The patient was taken to Operating Room, identified as Traci Cross and the procedure verified as laparoscopic appendectomy. A Time Out was held and the above information confirmed.  The patient was placed in the supine position and general anesthesia was induced.  Antibiotic prophylaxis was administered and VT E prophylaxis was in place.  The abdomen was prepped and draped in a sterile fashion. An infraumbilical incision was made. A cutdown technique was used to enter the abdominal cavity. Two vicryl stitches were placed on the fascia and a Hasson trocar inserted. Pneumoperitoneum obtained. Two 5 mm ports were placed under direct visualization.  The appendix was identified and found to be acutely inflamed  There was severe inflammatory reaction and it was necrotic. I was able to dissect and divide the mesoappendix. Given the concerning finding I elected to convert to hand assisted and increase the length of my incision. Gelport was placed. I was able to place my hand and feel around.  The  cecum was hard and indurated w/o any perforation.  Given all these findings I had to do a partial cecectomy to be able to obtain a good viable margin. I fired multiple blue loads with a standard load incorporating the base of the appendix and lateral cecal wall  Endo GIA.The appendix and part of the cecum was placed in a Endo Catch bag and removed via the Hasson port.    The right lower quadrant and pelvis was then irrigated with  normal saline which was aspirated. Inspection  failed to identify any additional bleeding and there were no signs of bowel injury. Again the right lower quadrant was inspected there was no sign of bleeding or bowel injury therefore pneumoperitoneum was released, all ports were removed. 15 FR blake drain place in the RLQ. Liposomal marcaine was infiltrated and fascia closed w a 0 PDS suture. Skin closed w staples.   The patient tolerated the procedure well, there were no complications. The sponge lap and needle count were correct at the end of the procedure.  The patient was taken to the recovery room in stable condition to be admitted for continued care.    Caroleen Hamman, MD FACS

## 2019-06-29 NOTE — Anesthesia Postprocedure Evaluation (Signed)
Anesthesia Post Note  Patient: Traci Cross  Procedure(s) Performed: APPENDECTOMY LAPAROSCOPIC cecectomy (N/A )  Patient location during evaluation: PACU Anesthesia Type: General Level of consciousness: awake and alert Pain management: pain level controlled Vital Signs Assessment: post-procedure vital signs reviewed and stable Respiratory status: spontaneous breathing, nonlabored ventilation, respiratory function stable and patient connected to nasal cannula oxygen Cardiovascular status: blood pressure returned to baseline and stable Postop Assessment: no apparent nausea or vomiting Anesthetic complications: no     Last Vitals:  Vitals:   06/29/19 1422 06/29/19 1516  BP: (!) 110/58 119/75  Pulse: 68 71  Resp: 17 17  Temp:  36.5 C  SpO2: 100% 91%    Last Pain:  Vitals:   06/29/19 1719  TempSrc:   PainSc: 2                  Alphonsus Sias

## 2019-06-30 ENCOUNTER — Encounter: Payer: Self-pay | Admitting: Surgery

## 2019-06-30 LAB — COMPREHENSIVE METABOLIC PANEL
ALT: 52 U/L — ABNORMAL HIGH (ref 0–44)
AST: 35 U/L (ref 15–41)
Albumin: 2.9 g/dL — ABNORMAL LOW (ref 3.5–5.0)
Alkaline Phosphatase: 103 U/L (ref 38–126)
Anion gap: 5 (ref 5–15)
BUN: 10 mg/dL (ref 6–20)
CO2: 28 mmol/L (ref 22–32)
Calcium: 8 mg/dL — ABNORMAL LOW (ref 8.9–10.3)
Chloride: 107 mmol/L (ref 98–111)
Creatinine, Ser: 0.77 mg/dL (ref 0.44–1.00)
GFR calc Af Amer: >60 mL/min (ref >60)
GFR calc non Af Amer: >60 mL/min (ref >60)
Glucose, Bld: 124 mg/dL — ABNORMAL HIGH (ref 70–99)
Potassium: 3.8 mmol/L (ref 3.5–5.1)
Sodium: 140 mmol/L (ref 135–145)
Total Bilirubin: 0.3 mg/dL (ref 0.3–1.2)
Total Protein: 6 g/dL — ABNORMAL LOW (ref 6.5–8.1)

## 2019-06-30 MED ORDER — GABAPENTIN 600 MG PO TABS
600.0000 mg | ORAL_TABLET | Freq: Three times a day (TID) | ORAL | Status: DC
  Administered 2019-06-30 – 2019-07-01 (×4): 600 mg via ORAL
  Filled 2019-06-30 (×4): qty 1

## 2019-06-30 MED ORDER — SODIUM CHLORIDE 0.9 % IV SOLN
INTRAVENOUS | Status: DC | PRN
  Administered 2019-06-30 – 2019-07-01 (×2): 1000 mL via INTRAVENOUS

## 2019-06-30 MED ORDER — ZOLPIDEM TARTRATE 5 MG PO TABS
5.0000 mg | ORAL_TABLET | Freq: Every evening | ORAL | Status: DC | PRN
  Administered 2019-06-30: 5 mg via ORAL
  Filled 2019-06-30 (×2): qty 1

## 2019-06-30 MED ORDER — KETOROLAC TROMETHAMINE 15 MG/ML IJ SOLN
15.0000 mg | Freq: Four times a day (QID) | INTRAMUSCULAR | Status: DC
  Administered 2019-06-30 – 2019-07-01 (×3): 15 mg via INTRAVENOUS
  Filled 2019-06-30 (×3): qty 1

## 2019-06-30 NOTE — Progress Notes (Signed)
Pt refuses to wear socks, and turn on lights when walking to bathroom. Pt educated on fall precautions, and still refuses.

## 2019-06-30 NOTE — Progress Notes (Signed)
POD # 1 HALS partial cecectomy Doing well but pain is the main issue Taking clears, + flatus AVSS Labs ok  PE NAD Abd: dressing in place, dry , no infection. JP serosanguinous No peritonitis  A/p Doing well Continue a/bs given chronic inflammation and induration /phegmon of cecum Advance diet Added gabapentin for adjuvant pain control DC in am

## 2019-07-01 ENCOUNTER — Ambulatory Visit: Payer: Medicaid Other

## 2019-07-01 ENCOUNTER — Telehealth: Payer: Self-pay

## 2019-07-01 LAB — COMPREHENSIVE METABOLIC PANEL
ALT: 37 U/L (ref 0–44)
AST: 24 U/L (ref 15–41)
Albumin: 2.8 g/dL — ABNORMAL LOW (ref 3.5–5.0)
Alkaline Phosphatase: 88 U/L (ref 38–126)
Anion gap: 5 (ref 5–15)
BUN: 11 mg/dL (ref 6–20)
CO2: 27 mmol/L (ref 22–32)
Calcium: 7.8 mg/dL — ABNORMAL LOW (ref 8.9–10.3)
Chloride: 110 mmol/L (ref 98–111)
Creatinine, Ser: 0.74 mg/dL (ref 0.44–1.00)
GFR calc Af Amer: >60 mL/min (ref >60)
GFR calc non Af Amer: >60 mL/min (ref >60)
Glucose, Bld: 102 mg/dL — ABNORMAL HIGH (ref 70–99)
Potassium: 3.2 mmol/L — ABNORMAL LOW (ref 3.5–5.1)
Sodium: 142 mmol/L (ref 135–145)
Total Bilirubin: 0.3 mg/dL (ref 0.3–1.2)
Total Protein: 5.7 g/dL — ABNORMAL LOW (ref 6.5–8.1)

## 2019-07-01 LAB — CBC
HCT: 32.7 % — ABNORMAL LOW (ref 36.0–46.0)
Hemoglobin: 10.9 g/dL — ABNORMAL LOW (ref 12.0–15.0)
MCH: 29.6 pg (ref 26.0–34.0)
MCHC: 33.3 g/dL (ref 30.0–36.0)
MCV: 88.9 fL (ref 80.0–100.0)
Platelets: 155 10*3/uL (ref 150–400)
RBC: 3.68 MIL/uL — ABNORMAL LOW (ref 3.87–5.11)
RDW: 12.7 % (ref 11.5–15.5)
WBC: 5.8 10*3/uL (ref 4.0–10.5)
nRBC: 0 % (ref 0.0–0.2)

## 2019-07-01 MED ORDER — CIPROFLOXACIN HCL 500 MG PO TABS: 500.0000 mg | ORAL_TABLET | Freq: Two times a day (BID) | ORAL | 0 refills | Status: AC

## 2019-07-01 MED ORDER — GABAPENTIN 600 MG PO TABS: 600.0000 mg | ORAL_TABLET | Freq: Three times a day (TID) | ORAL | 0 refills | Status: DC

## 2019-07-01 MED ORDER — METRONIDAZOLE 500 MG PO TABS: 500.0000 mg | ORAL_TABLET | Freq: Three times a day (TID) | ORAL | 0 refills | Status: AC

## 2019-07-01 MED ORDER — OXYCODONE HCL 5 MG PO TABS: 5.0000 mg | ORAL_TABLET | ORAL | 0 refills | Status: DC | PRN

## 2019-07-01 NOTE — Discharge Summary (Signed)
[facility] SURGICAL DISCHARGE SUMMARY  Patient ID: Traci Cross MRN: [id number] DOB/AGE: August 25, 1961 58 y.o.  Admit date: 06/28/2019 Discharge date: 07/01/2019  Discharge Diagnoses Patient Active Problem List   Diagnosis Date Noted  . Appendicitis 06/28/2019    Consultants None  Procedures 06/29/2019 Hand assisted laparoscopic cecectomy  HPI: Traci Cross is a 58 y.o. female 5 to 6-day history of abdominal pain.  She reports that the pain was initially periumbilical now has localized to her lateral right lower quadrant.  She is also endorses some decreased appetite.  No fevers no chills no evidence of biliary obstruction.  She did have a history of cholecystectomy more than 20 years ago.  No records are available.  Does have a history of a stroke and is on daily aspirin.  She does have COPD.  Work-up in the ER included a CT scan of the abdomen and pelvis that I have personally reviewed showing evidence of acute appendicitis without perforation no free air there is also incidental findings consistent with choledocholithiasis.  Her white count is a little slightly elevated and her LFTs are completely normal.  Have also discussed with Dr. Allen Norris regarding ERCP and this time the priority is to get acute illness which is the appendicitis therefore that takes priority  Hospital Course: She was admitted to general surgery. Informed consent was obtained and documented, and patient underwent uneventful hand-assisted laparoscopic cecectomy (Dr Dahlia Byes, 06/29/2019).  Post-operatively, patient's pain and symptoms improved/resolved and advancement of patient's diet and ambulation were well-tolerated. The remainder of patient's hospital course was essentially unremarkable, and discharge planning was initiated accordingly with patient safely able to be discharged home with appropriate discharge instructions, antibiotics (cipro + Flagyl x10 days), pain control, and outpatient follow-up after all  of her questions were answered to his expressed satisfaction.  Discharge Condition: Good   Physical Examination:  Constitutional: Well appearing female, NAD Pulmonary: Normal effort, no respiratory distress, on [place] Gastrointestinal: Soft, incisional soreness, non-distended, JP in suprapubic region - removed at discharge Skin: laparoscopic incisions CDI with staples. Mini laparotomy incision CDI with staples, ecchymotic, no erythema or drainage   Allergies as of 07/01/2019      Reactions   Levaquin [levofloxacin] Hives   Dr notified.    Codeine Hives   Hydrocodone Hives   Penicillins Hives      Medication List    TAKE these medications   acetaminophen 500 MG tablet Commonly known as: TYLENOL Take 1,000 mg by mouth as needed.   albuterol 108 (90 Base) MCG/ACT inhaler Commonly known as: Ventolin HFA Inhale 2 puffs into the lungs every 6 (six) hours as needed for wheezing or shortness of breath.   aspirin EC 81 MG tablet Take 1 tablet (81 mg total) by mouth daily.   atorvastatin 40 MG tablet Commonly known as: LIPITOR Take 1 tablet (40 mg total) by mouth daily at 6 PM.   budesonide-formoterol 160-4.5 MCG/ACT inhaler Commonly known as: Symbicort Inhale 2 puffs into the lungs 2 (two) times daily.   celecoxib 100 MG capsule Commonly known as: CELEBREX Take 100 mg by mouth 2 (two) times daily.   ciprofloxacin 500 MG tablet Commonly known as: Cipro Take 1 tablet (500 mg total) by mouth 2 (two) times daily for 10 days.   gabapentin 600 MG tablet Commonly known as: NEURONTIN Take 1 tablet (600 mg total) by mouth 3 (three) times daily.   hydrochlorothiazide 25 MG tablet Commonly known as: HYDRODIURIL Take 1 tablet (25 mg total) by  mouth daily.   metroNIDAZOLE 500 MG tablet Commonly known as: Flagyl Take 1 tablet (500 mg total) by mouth 3 (three) times daily for 10 days.   naproxen sodium 220 MG tablet Commonly known as: ALEVE Take 220 mg by mouth 2 (two) times daily  as needed.   oxyCODONE 5 MG immediate release tablet Commonly known as: Oxy IR/ROXICODONE Take 1 tablet (5 mg total) by mouth every 4 (four) hours as needed for severe pain or breakthrough pain.        Follow-up Information    Pabon, [place]Diego F, MD. Schedule an appointment as soon as possible for a visit in 1 week(s).   Specialty: General Surgery Why: 1 week follow up, okay to see zach PA, needs staples out Contact information: [address] [place] [address] [phone]            Time spent on discharge management including discussion of hospital course, clinical condition, outpatient instructions, prescriptions, and follow up with the patient and members of the medical team: >30 minutes  -- Traci Cross , PA-C [facility]  07/01/2019, 8:52 AM [phone] M-F: 7am - 4pm

## 2019-07-01 NOTE — Discharge Instructions (Signed)
In addition to included general post-operative instructions for laparoscopic appendectomy,  Diet: Resume home heart healthy diet.   Activity: No heavy lifting >20 pounds (children, pets, laundry, garbage) for at least 4-6 weeks, but light activity and walking are encouraged. Do not drive or drink alcohol if taking narcotic pain medications or having pain that might distract from driving.  Wound care: You may shower/get incision wet with soapy water and pat dry (do not rub incisions), but no baths or submerging incision underwater until follow-up.   Medications: Resume all home medications. For mild to moderate pain: acetaminophen (Tylenol) or ibuprofen/naproxen (if no kidney disease). Combining Tylenol with alcohol can substantially increase your risk of causing liver disease. Narcotic pain medications, if prescribed, can be used for severe pain, though may cause nausea, constipation, and drowsiness. Do not combine Tylenol and Percocet (or similar) within a 6 hour period as Percocet (and similar) contain(s) Tylenol. If you do not need the narcotic pain medication, you do not need to fill the prescription.  Call office [phone] / [phone]) at any time if any questions, worsening pain, fevers/chills, bleeding, drainage from incision site, or other concerns.

## 2019-07-01 NOTE — Progress Notes (Signed)
Traci Cross to be D/C'd home per MD order.  Discussed prescriptions and follow up appointments with the patient. Prescriptions given to patient, medication list explained in detail. Pt verbalized understanding.  Allergies as of 07/01/2019      Reactions   Levaquin [levofloxacin] Hives   Dr notified.    Codeine Hives   Hydrocodone Hives   Penicillins Hives      Medication List    TAKE these medications   acetaminophen 500 MG tablet Commonly known as: TYLENOL Take 1,000 mg by mouth as needed.   albuterol 108 (90 Base) MCG/ACT inhaler Commonly known as: Ventolin HFA Inhale 2 puffs into the lungs every 6 (six) hours as needed for wheezing or shortness of breath.   aspirin EC 81 MG tablet Take 1 tablet (81 mg total) by mouth daily.   atorvastatin 40 MG tablet Commonly known as: LIPITOR Take 1 tablet (40 mg total) by mouth daily at 6 PM.   budesonide-formoterol 160-4.5 MCG/ACT inhaler Commonly known as: Symbicort Inhale 2 puffs into the lungs 2 (two) times daily.   celecoxib 100 MG capsule Commonly known as: CELEBREX Take 100 mg by mouth 2 (two) times daily.   ciprofloxacin 500 MG tablet Commonly known as: Cipro Take 1 tablet (500 mg total) by mouth 2 (two) times daily for 10 days.   gabapentin 600 MG tablet Commonly known as: NEURONTIN Take 1 tablet (600 mg total) by mouth 3 (three) times daily.   hydrochlorothiazide 25 MG tablet Commonly known as: HYDRODIURIL Take 1 tablet (25 mg total) by mouth daily.   metroNIDAZOLE 500 MG tablet Commonly known as: Flagyl Take 1 tablet (500 mg total) by mouth 3 (three) times daily for 10 days.   naproxen sodium 220 MG tablet Commonly known as: ALEVE Take 220 mg by mouth 2 (two) times daily as needed.   oxyCODONE 5 MG immediate release tablet Commonly known as: Oxy IR/ROXICODONE Take 1 tablet (5 mg total) by mouth every 4 (four) hours as needed for severe pain or breakthrough pain.       Vitals:   06/30/19 1957 07/01/19  0518  BP: 120/72 136/83  Pulse: 81 73  Resp: 20 20  Temp: 98.3 F (36.8 C) 97.9 F (36.6 C)  SpO2: 98% 96%    Skin clean, dry and intact without evidence of skin break down, no evidence of skin tears noted. IV catheter discontinued intact. Site without signs and symptoms of complications. Dressing and pressure applied. Pt denies pain at this time. No complaints noted.  An After Visit Summary was printed and given to the patient. Patient escorted via [place], and D/C home via private auto.  Chuck Hint RN [facility] [employer]

## 2019-07-01 NOTE — Telephone Encounter (Signed)
Left message and asked pt to call and give [place] an update on how he is feeling after ct scan and goinfg to ER for procedure. Beth

## 2019-07-02 LAB — SURGICAL PATHOLOGY

## 2019-07-03 ENCOUNTER — Telehealth: Payer: Self-pay

## 2019-07-03 ENCOUNTER — Other Ambulatory Visit: Payer: Self-pay | Admitting: Adult Health

## 2019-07-03 ENCOUNTER — Other Ambulatory Visit: Payer: Self-pay

## 2019-07-03 MED ORDER — ZOLPIDEM TARTRATE 5 MG PO TABS: 5.0000 mg | ORAL_TABLET | Freq: Every evening | ORAL | 0 refills | Status: DC | PRN

## 2019-07-03 NOTE — Progress Notes (Signed)
Will give patient a few ambien to help her sleep while recovering from appendectomy.

## 2019-07-03 NOTE — Telephone Encounter (Signed)
Adam sent in 10 tab of 5 mg ambien for pt and told me to advise pt that she should try not to take it everyday because she does not want to become dependent on it and it should be enough for her recovery from surgery.

## 2019-07-08 ENCOUNTER — Other Ambulatory Visit: Payer: Self-pay

## 2019-07-08 ENCOUNTER — Ambulatory Visit (INDEPENDENT_AMBULATORY_CARE_PROVIDER_SITE_OTHER): Payer: Medicaid Other | Admitting: Physician Assistant

## 2019-07-08 ENCOUNTER — Encounter: Payer: Self-pay | Admitting: Physician Assistant

## 2019-07-08 VITALS — BP 142/74 | HR 86 | Temp 97.5°F | Ht 67.0 in | Wt 200.0 lb

## 2019-07-08 DIAGNOSIS — Z09 Encounter for follow-up examination after completed treatment for conditions other than malignant neoplasm: Secondary | ICD-10-CM

## 2019-07-08 DIAGNOSIS — K3580 Unspecified acute appendicitis: Secondary | ICD-10-CM

## 2019-07-08 MED ORDER — CYCLOBENZAPRINE HCL 5 MG PO TABS: 5.0000 mg | ORAL_TABLET | Freq: Three times a day (TID) | ORAL | 0 refills | Status: DC | PRN

## 2019-07-08 NOTE — Progress Notes (Signed)
[facility] POST-OP OFFICE VISIT  07/08/2019  HPI: Traci Cross is a 58 y.o. female 9 days s/p hand assisted cecectomy for acute appendicitis with phlegmon within cecum with Dr Dahlia Byes.   Overall, she reports that she is doing well. She has had mostly medial and left sided abdominal pain since the surgery. Sharp and crampy. Minimal relief with pain medications but she is trying to avoid using the narcotic. No fever, chills, nausea, emesis, or issues with bowels. No further complaints.   Vital signs: BP (!) 142/74   Pulse 86   Temp (!) 97.5 F (36.4 C) (Skin)   Ht 5\' 7"  (1.702 m)   Wt 200 lb (90.7 kg)   SpO2 97%   BMI 31.32 kg/m    Physical Exam: Constitutional: Well appearing female, NAD Abdomen: Soft, umbilical and left sided abdominal pain, non-distended, no rebound/guarding Skin: Midline laparotomy incision is CDI with staples, there is surrounding ecchymosis but no erythema or drainage. Laparoscopic incisions are intact with staples, these are also ecchymotic, no erythema or drainage.   Assessment/Plan: This is a 58 y.o. female 9 days s/p hand assisted cecectomy for acute appendicitis   - Continue pain control prn; added Flexeril for multi-modal management  - okay to shower  - light activity okay; continue lifting restrictions  - reviewed driving restrictions  - reviewed pathology   - surgery follow up in 2 weeks   - She will also follow up with Dr Allen Norris as scheduled for incidental choledocholithiasis  -- Edison Simon, PA-C [facility] 07/08/2019, 9:31 AM [phone] M-F: 7am - 4pm

## 2019-07-08 NOTE — Patient Instructions (Addendum)
Return in two weeks. The patient is aware to call back for any questions or concerns.  

## 2019-07-09 ENCOUNTER — Ambulatory Visit: Payer: Medicaid Other

## 2019-07-12 ENCOUNTER — Telehealth: Payer: Self-pay

## 2019-07-12 NOTE — Telephone Encounter (Signed)
CMN SIGNED AND PLACED IN [place] FOLDER. °

## 2019-07-16 ENCOUNTER — Ambulatory Visit: Payer: Medicaid Other | Admitting: Gastroenterology

## 2019-07-16 ENCOUNTER — Telehealth: Payer: Self-pay | Admitting: *Deleted

## 2019-07-16 ENCOUNTER — Encounter: Payer: Self-pay | Admitting: Gastroenterology

## 2019-07-16 NOTE — Telephone Encounter (Signed)
Patient called, she had surgery on 06/29/19 with Dr.Pabon on 06/29/19 and she wants to know when she can take a bath. Please call and advise

## 2019-07-16 NOTE — Telephone Encounter (Signed)
Patient notified not to submerge her surgical site until fully healed. She will follow up here on 07/23/19.

## 2019-07-16 NOTE — Progress Notes (Deleted)
Gastroenterology Consultation  Referring Provider:     Kendell Bane, NP Primary Care Physician:  Kendell Bane, NP Primary Gastroenterologist:  Dr. Allen Norris     Reason for Consultation:     Choledocholithiasis        HPI:   Traci Cross is a 58 y.o. y/o female referred for consultation & management of choledocholithiasis by Dr. Versie Starks, Audie Clear, NP.  This patient was sent to me today for evaluation of choledocholithiasis found incidentally on a CT scan of the abdomen.  The patient was in the hospital earlier September for acute appendicitis and underwent surgery.  The patient at that time was discharged with a recommendation to follow-up with me for choledocholithiasis.  The patient's CT did show dilation of the common bile duct in addition to a common bile duct stone.  The patient's liver enzymes on discharge were normal.  Past Medical History:  Diagnosis Date   Asthma    COPD (chronic obstructive pulmonary disease) ([place])    CVA (cerebral vascular accident) ([place]) 04/19/2019   Depression    Hypertension    Sleep apnea    Tobacco abuse     Past Surgical History:  Procedure Laterality Date   CHOLECYSTECTOMY     LAPAROSCOPIC APPENDECTOMY N/A 06/29/2019   Procedure: APPENDECTOMY LAPAROSCOPIC cecectomy;  Surgeon: Jules Husbands, MD;  Location: [facility] ORS;  Service: General;  Laterality: N/A;   TONSILLECTOMY      Prior to Admission medications   Medication Sig Start Date End Date Taking? Authorizing Provider  acetaminophen (TYLENOL) 500 MG tablet Take 1,000 mg by mouth as needed.    [provider]  albuterol (VENTOLIN HFA) 108 (90 Base) MCG/ACT inhaler Inhale 2 puffs into the lungs every 6 (six) hours as needed for wheezing or shortness of breath. 03/27/19   Kendell Bane, NP  aspirin EC 81 MG tablet Take 1 tablet (81 mg total) by mouth daily. 04/19/19 04/18/20  Duffy Bruce, MD  atorvastatin (LIPITOR) 40 MG tablet Take 1 tablet (40 mg total) by mouth daily at  6 PM. 04/20/19   Demetrios Loll, MD  budesonide-formoterol [facility]) 160-4.5 MCG/ACT inhaler Inhale 2 puffs into the lungs 2 (two) times daily. 04/30/19   Kendell Bane, NP  celecoxib (CELEBREX) 100 MG capsule Take 100 mg by mouth 2 (two) times daily.    [provider]  cyclobenzaprine (FLEXERIL) 5 MG tablet Take 1 tablet (5 mg total) by mouth 3 (three) times daily as needed for muscle spasms. 07/08/19   Tylene Fantasia, PA-C  gabapentin (NEURONTIN) 600 MG tablet Take 1 tablet (600 mg total) by mouth 3 (three) times daily. 07/01/19   Tylene Fantasia, PA-C  hydrochlorothiazide (HYDRODIURIL) 25 MG tablet Take 1 tablet (25 mg total) by mouth daily. 07/02/18   Kendell Bane, NP  naproxen sodium (ALEVE) 220 MG tablet Take 220 mg by mouth 2 (two) times daily as needed.    [provider]  oxyCODONE (OXY IR/ROXICODONE) 5 MG immediate release tablet Take 1 tablet (5 mg total) by mouth every 4 (four) hours as needed for severe pain or breakthrough pain. 07/01/19   Tylene Fantasia, PA-C  zolpidem (AMBIEN) 5 MG tablet Take 1 tablet (5 mg total) by mouth at bedtime as needed for sleep. 07/03/19   Kendell Bane, NP    Family History  Problem Relation Age of Onset   Heart failure Mother    Stroke Mother    Alzheimer's disease Mother  Heart failure Father    Stroke Father    Dementia Father      Social History   Tobacco Use   Smoking status: Current Every Day Smoker    Packs/day: 0.25    Years: 35.00    Pack years: 8.75    Types: Cigarettes   Smokeless tobacco: Never Used  Substance Use Topics   Alcohol use: No    Alcohol/week: 0.0 standard drinks   Drug use: No    Allergies as of 07/16/2019 - Review Complete 07/08/2019  Allergen Reaction Noted   Levaquin [levofloxacin] Hives 03/25/2015   Codeine Hives 08/12/2015   Hydrocodone Hives 06/28/2019   Penicillins Hives 08/12/2015    Review of Systems:    All systems reviewed and negative except where  noted in HPI.   Physical Exam:  There were no vitals taken for this visit. No LMP recorded. Patient is postmenopausal. General:   Alert,  Well-developed, well-nourished, pleasant and cooperative in NAD Head:  Normocephalic and atraumatic. Eyes:  Sclera clear, no icterus.   Conjunctiva pink. Ears:  Normal auditory acuity. Nose:  No deformity, discharge, or lesions. Mouth:  No deformity or lesions,oropharynx pink & moist. Neck:  Supple; no masses or thyromegaly. Lungs:  Respirations even and unlabored.  Clear throughout to auscultation.   No wheezes, crackles, or rhonchi. No acute distress. Heart:  Regular rate and rhythm; no murmurs, clicks, rubs, or gallops. Abdomen:  Normal bowel sounds.  No bruits.  Soft, non-tender and non-distended without masses, hepatosplenomegaly or hernias noted.  No guarding or rebound tenderness.  Negative Carnett sign.   Rectal:  Deferred.  Msk:  Symmetrical without gross deformities.  Good, equal movement & strength bilaterally. Pulses:  Normal pulses noted. Extremities:  No clubbing or edema.  No cyanosis. Neurologic:  Alert and oriented x3;  grossly normal neurologically. Skin:  Intact without significant lesions or rashes.  No jaundice. Lymph Nodes:  No significant cervical adenopathy. Psych:  Alert and cooperative. Normal mood and affect.  Imaging Studies: Ct Abdomen Pelvis W Contrast  Result Date: 06/28/2019 CLINICAL DATA:  58 year old female with acute RIGHT abdominal and pelvic pain for 1 week. EXAM: CT ABDOMEN AND PELVIS WITH CONTRAST TECHNIQUE: Multidetector CT imaging of the abdomen and pelvis was performed using the standard protocol following bolus administration of intravenous contrast. CONTRAST:  OMNIPAQUE IOHEXOL 300 MG/ML  SOLN COMPARISON:  03/31/2018 chest CT FINDINGS: Lower chest: Unremarkable Hepatobiliary: Calcifications and small hyperdensities within the distal CBD near the ampulla noted compatible with choledocholithiasis. Mild CBD  dilatation (10 mm) and mild intrahepatic biliary dilatation identified. A LEFT hepatic cyst is again noted. The patient is status post cholecystectomy. Pancreas: Unremarkable Spleen: UPPER limits normal spleen size noted with a splenic volume of 400 cc. No focal splenic abnormalities are identified. Adrenals/Urinary Tract: The kidneys, adrenal glands and bladder are unremarkable. Stomach/Bowel: An enlarged thick-walled appendix measuring up to 13 mm in diameter with moderate adjacent inflammation is noted compatible with acute appendicitis. There is no evidence of pneumoperitoneum, abscess or bowel obstruction. No other significant bowel abnormalities are identified except for a few scattered colonic diverticula. Vascular/Lymphatic: Aortic atherosclerosis. No enlarged abdominal or pelvic lymph nodes. Reproductive: Uterus and bilateral adnexa are unremarkable. Other: No ascites Musculoskeletal: No acute or suspicious bony abnormalities. IMPRESSION: 1. Acute appendicitis with moderate periappendiceal inflammation. No bowel obstruction, free fluid, abscess or pneumoperitoneum. 2. Distal choledocholithiasis with mild CBD and intrahepatic biliary dilatation. 3. UPPER limits normal spleen size. 4.  Aortic Atherosclerosis (ICD10-I70.0). Critical Value/emergent  results were called by telephone at the time of interpretation on 06/28/2019 at 3:30 pm to provider Dr ADAM Juanda BondSCARBORO , who verbally acknowledged these results. The patient will be transferred to the ER. Electronically Signed   By: Harmon PierJeffrey  Hu M.D.   On: 06/28/2019 15:34    Assessment and Plan:   Marcene Corningllen M Desaulniers is a 58 y.o. y/o female ***  Midge Miniumarren Davin Muramoto, MD. Clementeen GrahamFACG    Note: This dictation was prepared with Dragon dictation along with smaller phrase technology. Any transcriptional errors that result from this process are unintentional.

## 2019-07-22 ENCOUNTER — Encounter: Payer: Medicaid Other | Admitting: Physician Assistant

## 2019-07-23 ENCOUNTER — Encounter: Payer: Medicaid Other | Admitting: Physician Assistant

## 2019-07-25 ENCOUNTER — Ambulatory Visit (INDEPENDENT_AMBULATORY_CARE_PROVIDER_SITE_OTHER): Payer: Medicaid Other | Admitting: Physician Assistant

## 2019-07-25 ENCOUNTER — Other Ambulatory Visit: Payer: Self-pay

## 2019-07-25 ENCOUNTER — Encounter: Payer: Self-pay | Admitting: Physician Assistant

## 2019-07-25 VITALS — BP 110/80 | HR 76 | Temp 97.5°F | Wt 199.0 lb

## 2019-07-25 DIAGNOSIS — Z09 Encounter for follow-up examination after completed treatment for conditions other than malignant neoplasm: Secondary | ICD-10-CM

## 2019-07-25 DIAGNOSIS — K3580 Unspecified acute appendicitis: Secondary | ICD-10-CM

## 2019-07-25 MED ORDER — OXYCODONE HCL 5 MG PO TABS: 5.0000 mg | ORAL_TABLET | ORAL | 0 refills | Status: DC | PRN

## 2019-07-25 NOTE — Patient Instructions (Addendum)
You may use a heating pad to the incision to help with healing and comfort.  You may take Ibuprofen 600 mg every 6 hours. You may take Tylenol as well every 4-6 hours.   We will refill your Oxycodone, use only as needed.  Two more weeks of limited activity and lifting  You may take a bath.  Follow up here in 2 weeks.

## 2019-07-25 NOTE — Progress Notes (Signed)
[facility] POST-OP OFFICE VISIT  07/25/2019  HPI: Traci Cross is a 58 y.o. female 26 days s/p hand assisted cecectomy for acute appendicitis with phlegmon within cecum with Dr Dahlia Byes.   Today, she reports that she is doing okay. She is still reporting diffuse abdominal soreness and a "burning" pain. No fever, chills, nausea, or emesis. She has tried flexeril an gabapentin without improvement. No taking narcotics. Trying to substitute tylenol only. Denied any strenuous activity. Otherwise doing well.   Vital signs: BP 110/80   Pulse 76   Temp (!) 97.5 F (36.4 C)   Wt 199 lb (90.3 kg)   SpO2 94%   BMI 31.17 kg/m    Physical Exam: Constitutional: Well appearing female, NAD Abdomen: Soft, obese, diffuse mild tenderness, non-distended. No rebound/guarding Skin: Laparotomy incision is well healed, small scab to medial portion, no erythema or drainage.  Assessment/Plan: This is a 58 y.o. female 26 days s/p hand assisted cecectomy for acute appendicitis with phlegmon within cecum   - Discussed pain control moving forward. I am will to prescribe one last prescription of oxycodone (20 pills, 5mg ). She understands that she should attempt other pain management options in combination with or before using the narcotics. These include tylenol, motrin, ice, or heat. She was verbally understanding and agreeable  - Okay to submerge wounds  - complete 2 more weeks of lifting restrictions  - follow up in 2 weeks  -- Edison Simon, PA-C [facility] 07/25/2019, 11:01 AM [phone] M-F: 7am - 4pm

## 2019-07-30 ENCOUNTER — Telehealth: Payer: Self-pay | Admitting: Gastroenterology

## 2019-07-30 NOTE — Telephone Encounter (Signed)
Patient called to r/s appt from September. We have r/s'd her to 09-17-19 & on the appt note it states 2-3 wk f/u appt with Dr Allen Norris . She wanted to know if he is the doctor to help her with a gallstone blockage. She has had her gb removed years ago, but was told she needed another surgery. Please advise.

## 2019-07-31 ENCOUNTER — Ambulatory Visit (INDEPENDENT_AMBULATORY_CARE_PROVIDER_SITE_OTHER): Payer: Medicaid Other | Admitting: Surgery

## 2019-07-31 ENCOUNTER — Other Ambulatory Visit: Payer: Self-pay

## 2019-07-31 ENCOUNTER — Encounter: Payer: Self-pay | Admitting: Surgery

## 2019-07-31 ENCOUNTER — Telehealth: Payer: Self-pay | Admitting: *Deleted

## 2019-07-31 VITALS — BP 108/72 | HR 74 | Temp 98.1°F | Resp 14 | Ht 67.5 in | Wt 190.0 lb

## 2019-07-31 DIAGNOSIS — Z09 Encounter for follow-up examination after completed treatment for conditions other than malignant neoplasm: Secondary | ICD-10-CM

## 2019-07-31 NOTE — Telephone Encounter (Signed)
Patient had surgery with Dr.Pabon on 06/29/19, Appendectomy, she stated that she starting to have pain near the surgical area, constant pain started late Friday night. She is taking oxycodone but not regularly, she took two last night, she can not lay on her right side, the pain will start to hurt worse, She is afraid that she might of pulled something. Please call and advise

## 2019-07-31 NOTE — Telephone Encounter (Signed)
Pt was a no show for her hospital follow up. Dr. Allen Norris did not do any procedures on this pt.

## 2019-07-31 NOTE — Telephone Encounter (Signed)
Patient states she is not hurting near or around her surgery. Denies fever, chills, nausea or vomiting.she seems to think she picked up something and she has had some discomfort since then. She is taking oxycodone. No short of breath. Patient added to schedule today.

## 2019-07-31 NOTE — Patient Instructions (Addendum)
CT scheduled 08/01/19  @  10:15 am [facility]  Please pick up the oral contrast today across the street at [facility]. Please do not eat/drink 4 hours prior to having the scan.   Please see your follow up appointment listed below.

## 2019-08-01 ENCOUNTER — Other Ambulatory Visit: Payer: Self-pay

## 2019-08-01 ENCOUNTER — Telehealth: Payer: Self-pay

## 2019-08-01 ENCOUNTER — Ambulatory Visit
Admission: RE | Admit: 2019-08-01 | Discharge: 2019-08-01 | Disposition: A | Discharge status: Home / Self Care | Payer: Medicaid Other | Source: Ambulatory Visit | Attending: Surgery | Admitting: Surgery

## 2019-08-01 ENCOUNTER — Ambulatory Visit: Payer: Medicaid Other

## 2019-08-01 DIAGNOSIS — Z09 Encounter for follow-up examination after completed treatment for conditions other than malignant neoplasm: Secondary | ICD-10-CM | POA: Diagnosis present

## 2019-08-01 DIAGNOSIS — K8050 Calculus of bile duct without cholangitis or cholecystitis without obstruction: Secondary | ICD-10-CM

## 2019-08-01 MED ORDER — IOHEXOL 300 MG/ML  SOLN
100.0000 mL | Freq: Once | INTRAMUSCULAR | Status: AC | PRN
  Administered 2019-08-01: 11:00:00 100 mL via INTRAVENOUS

## 2019-08-01 NOTE — Telephone Encounter (Signed)
-----   Message from Jules Husbands, MD sent at 08/01/2019  1:00 PM EDT ----- Please also make sure she gets to see Dr. Allen Norris outpt, she needed to have an ERCP by him ----- Message ----- From: Celene Kras, CMA Sent: 08/01/2019  12:00 PM EDT To: Jules Husbands, MD  Patient notified, declined referral to pain clinic, will keep f/u appt with you. ----- Message ----- From: Jules Husbands, MD Sent: 08/01/2019  11:26 AM EDT To: [place]  Please let the pt know no acute issues on CT scan, may keep f/u appt. May need referal to pain doctor if she agrees ----- Message ----- From: Interface, Rad Results In Sent: 08/01/2019  11:03 AM EDT To: Jules Husbands, MD

## 2019-08-01 NOTE — Telephone Encounter (Signed)
Spoke with patient regarding CT results- per dr.Pabon no acute issues, declined referral to pain clinic. Reminded of follow up appointment With Dr.Pabon.

## 2019-08-01 NOTE — Progress Notes (Signed)
Traci Cross is a 58 year old female that is post partial cystectomy more than a month ago.  She does have a history of a choledocholithiasis as well but has not follow-up with Dr. Verl Blalock.  She reports now abdominal pain.  She does have a history of chronic pain.  No fevers no chills she is taking p.o  PE : NAD alert Abd: soft, there is tenderness without peritonitis.  No evidence of infection.  A/p diffuse abdominal pain of unknown etiology.  We will obtain a CT scan of the abdomen and pelvis.  She does have significant chronic pain issues and anxiety.  I will see her back after she gets her CT.  Also encouraged to follow-up with Dr.Wohl for outpatient ERCP regarding choledocholithiasis.  I do not think that there is evidence of ascending cholangitis that would mandate an emergent intervention at this time

## 2019-08-02 ENCOUNTER — Other Ambulatory Visit: Payer: Self-pay

## 2019-08-02 ENCOUNTER — Other Ambulatory Visit: Payer: Self-pay | Admitting: Adult Health

## 2019-08-02 DIAGNOSIS — K838 Other specified diseases of biliary tract: Secondary | ICD-10-CM

## 2019-08-05 ENCOUNTER — Other Ambulatory Visit
Admission: RE | Admit: 2019-08-05 | Discharge: 2019-08-05 | Disposition: A | Discharge status: Home / Self Care | Payer: Medicaid Other | Source: Ambulatory Visit | Attending: Gastroenterology | Admitting: Gastroenterology

## 2019-08-05 ENCOUNTER — Encounter: Payer: Self-pay | Admitting: Surgery

## 2019-08-05 ENCOUNTER — Ambulatory Visit (INDEPENDENT_AMBULATORY_CARE_PROVIDER_SITE_OTHER): Payer: Medicaid Other | Admitting: Surgery

## 2019-08-05 ENCOUNTER — Other Ambulatory Visit: Payer: Self-pay

## 2019-08-05 VITALS — BP 144/92 | HR 82 | Temp 97.3°F | Resp 14 | Ht 67.5 in | Wt 196.4 lb

## 2019-08-05 DIAGNOSIS — Z01812 Encounter for preprocedural laboratory examination: Secondary | ICD-10-CM | POA: Diagnosis present

## 2019-08-05 DIAGNOSIS — K8050 Calculus of bile duct without cholangitis or cholecystitis without obstruction: Secondary | ICD-10-CM | POA: Diagnosis not present

## 2019-08-05 DIAGNOSIS — Z20828 Contact with and (suspected) exposure to other viral communicable diseases: Secondary | ICD-10-CM | POA: Diagnosis not present

## 2019-08-05 NOTE — Patient Instructions (Addendum)
Please be sure to follow through with seeing  Gastroenterologist.  Please call our office if you have questions or concerns.

## 2019-08-06 LAB — SARS CORONAVIRUS 2 (TAT 6-24 HRS): SARS Coronavirus 2: NEGATIVE

## 2019-08-07 ENCOUNTER — Encounter: Payer: Medicaid Other | Admitting: Physician Assistant

## 2019-08-07 ENCOUNTER — Encounter: Payer: Self-pay | Admitting: Surgery

## 2019-08-07 ENCOUNTER — Telehealth: Payer: Self-pay | Admitting: Gastroenterology

## 2019-08-07 NOTE — Telephone Encounter (Signed)
Spoke with pt regarding her ERCP scheduled for tomorrow. Answered questions regarding her prep.

## 2019-08-07 NOTE — Telephone Encounter (Signed)
Patient called & has ERCP with DR Allen Norris 08-08-19 & has questions about prep.Please call

## 2019-08-07 NOTE — Progress Notes (Signed)
Outpatient Surgical Follow Up  08/07/2019  Traci Cross is an 58 y.o. female.   Chief Complaint  Patient presents with  . Routine Post Op    06/29/19 appendecotmy    HPI: Traci Cross is a 58 year old female with a history of prior appendectomy and came in with recurrent abdominal pain.  I have order a CT scan of the abdomen and pelvis that I have personally reviewed.  She does have common bile duct stones.  I have also made her aware of this again.  She was supposed to have an outpatient ERCP and I am not sure if she ever follow through. There is no evidence of any abscesses there is no evidence of any other acute intra-abdominal pathology.  Today she feels much better.  She feels that these might be related to some muscle cramping.  She denies any fevers any chills.  She is taking p.o. well.  Past Medical History:  Diagnosis Date  . Asthma   . COPD (chronic obstructive pulmonary disease) (HCC)   . CVA (cerebral vascular accident) (HCC) 04/19/2019  . Depression   . Hypertension   . Sleep apnea   . Tobacco abuse     Past Surgical History:  Procedure Laterality Date  . CHOLECYSTECTOMY    . LAPAROSCOPIC APPENDECTOMY N/A 06/29/2019   Procedure: APPENDECTOMY LAPAROSCOPIC cecectomy;  Surgeon: Leafy Ro, MD;  Location: [facility] ORS;  Service: General;  Laterality: N/A;  . TONSILLECTOMY      Family History  Problem Relation Age of Onset  . Heart failure Mother   . Stroke Mother   . Alzheimer's disease Mother   . Heart failure Father   . Stroke Father   . Dementia Father     Social History:  reports that she has been smoking cigarettes. She has a 8.75 pack-year smoking history. She has never used smokeless tobacco. She reports that she does not drink alcohol or use drugs.  Allergies:  Allergies  Allergen Reactions  . Levaquin [Levofloxacin] Hives    Dr notified.   . Codeine Hives  . Hydrocodone Hives  . Penicillins Hives    Medications reviewed.    ROS Full ROS  performed and is otherwise negative other than what is stated in HPI   BP (!) 144/92   Pulse 82   Temp (!) 97.3 F (36.3 C) (Temporal)   Resp 14   Ht 5' 7.5" (1.715 m)   Wt 196 lb 6.4 oz (89.1 kg)   SpO2 93%   BMI 30.31 kg/m   Physical Exam Vitals signs and nursing note reviewed. Exam conducted with a chaperone present.  Constitutional:      General: She is not in acute distress.    Appearance: She is normal weight.  Eyes:     General: No scleral icterus.       Right eye: No discharge.        Left eye: No discharge.     Pupils: Pupils are equal, round, and reactive to light.  Neck:     Musculoskeletal: Normal range of motion and neck supple. No neck rigidity or muscular tenderness.  Pulmonary:     Effort: Pulmonary effort is normal. No respiratory distress.  Abdominal:     General: Abdomen is flat. There is no distension.     Palpations: Abdomen is soft.     Tenderness: There is no abdominal tenderness. There is no left CVA tenderness, guarding or rebound.     Hernia: No hernia is present.  Genitourinary:    General: Normal vulva.     Rectum: Guaiac result negative.  Musculoskeletal: Normal range of motion.        General: No swelling.  Skin:    General: Skin is warm and dry.     Capillary Refill: Capillary refill takes less than 2 seconds.     Coloration: Skin is not jaundiced.  Neurological:     General: No focal deficit present.     Mental Status: She is alert.  Psychiatric:        Mood and Affect: Mood normal.        Behavior: Behavior normal.        Thought Content: Thought content normal.        Judgment: Judgment normal.    Assessment/Plan:  Traci Cross is a 58 year old female status post appendectomy now with abdominal pain.  This is not related to her prior appendectomy.  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  Again I have refer her to GI for ERCP.  She understands that she needs to be compliant and follow-up with this  procedure.  There is no need for emergent surgical intervention Greater than 50% of the 25 minutes  visit was spent in counseling/coordination of care   Caroleen Hamman, MD [place] Surgeon

## 2019-08-08 ENCOUNTER — Ambulatory Visit: Payer: Medicaid Other

## 2019-08-08 ENCOUNTER — Encounter: Payer: Self-pay | Admitting: Anesthesiology

## 2019-08-08 ENCOUNTER — Encounter
Admission: RE | Disposition: A | Discharge status: Home / Self Care | Payer: Self-pay | Source: Home / Self Care | Attending: Gastroenterology

## 2019-08-08 ENCOUNTER — Ambulatory Visit: Payer: Medicaid Other | Admitting: Anesthesiology

## 2019-08-08 ENCOUNTER — Ambulatory Visit
Admission: RE | Admit: 2019-08-08 | Discharge: 2019-08-08 | Disposition: A | Discharge status: Home / Self Care | Payer: Medicaid Other | Attending: Gastroenterology | Admitting: Gastroenterology

## 2019-08-08 DIAGNOSIS — F329 Major depressive disorder, single episode, unspecified: Secondary | ICD-10-CM | POA: Insufficient documentation

## 2019-08-08 DIAGNOSIS — Z79899 Other long term (current) drug therapy: Secondary | ICD-10-CM | POA: Diagnosis not present

## 2019-08-08 DIAGNOSIS — Z823 Family history of stroke: Secondary | ICD-10-CM | POA: Insufficient documentation

## 2019-08-08 DIAGNOSIS — I10 Essential (primary) hypertension: Secondary | ICD-10-CM | POA: Insufficient documentation

## 2019-08-08 DIAGNOSIS — Z880 Allergy status to penicillin: Secondary | ICD-10-CM | POA: Diagnosis not present

## 2019-08-08 DIAGNOSIS — Z820 Family history of epilepsy and other diseases of the nervous system: Secondary | ICD-10-CM | POA: Diagnosis not present

## 2019-08-08 DIAGNOSIS — J449 Chronic obstructive pulmonary disease, unspecified: Secondary | ICD-10-CM | POA: Diagnosis not present

## 2019-08-08 DIAGNOSIS — Z8673 Personal history of transient ischemic attack (TIA), and cerebral infarction without residual deficits: Secondary | ICD-10-CM | POA: Insufficient documentation

## 2019-08-08 DIAGNOSIS — F17210 Nicotine dependence, cigarettes, uncomplicated: Secondary | ICD-10-CM | POA: Diagnosis not present

## 2019-08-08 DIAGNOSIS — G4730 Sleep apnea, unspecified: Secondary | ICD-10-CM | POA: Insufficient documentation

## 2019-08-08 DIAGNOSIS — Z9049 Acquired absence of other specified parts of digestive tract: Secondary | ICD-10-CM | POA: Insufficient documentation

## 2019-08-08 DIAGNOSIS — Z7951 Long term (current) use of inhaled steroids: Secondary | ICD-10-CM | POA: Insufficient documentation

## 2019-08-08 DIAGNOSIS — Z885 Allergy status to narcotic agent status: Secondary | ICD-10-CM | POA: Diagnosis not present

## 2019-08-08 DIAGNOSIS — K8050 Calculus of bile duct without cholangitis or cholecystitis without obstruction: Secondary | ICD-10-CM

## 2019-08-08 DIAGNOSIS — Z888 Allergy status to other drugs, medicaments and biological substances status: Secondary | ICD-10-CM | POA: Insufficient documentation

## 2019-08-08 DIAGNOSIS — Z7982 Long term (current) use of aspirin: Secondary | ICD-10-CM | POA: Diagnosis not present

## 2019-08-08 DIAGNOSIS — Z8249 Family history of ischemic heart disease and other diseases of the circulatory system: Secondary | ICD-10-CM | POA: Diagnosis not present

## 2019-08-08 DIAGNOSIS — K838 Other specified diseases of biliary tract: Secondary | ICD-10-CM

## 2019-08-08 HISTORY — PX: ERCP: SHX5425

## 2019-08-08 SURGERY — ERCP, WITH INTERVENTION IF INDICATED
Anesthesia: General

## 2019-08-08 MED ORDER — INDOMETHACIN 50 MG RE SUPP: 100.0000 mg | Freq: Once | RECTAL | Status: DC

## 2019-08-08 MED ORDER — INDOMETHACIN 50 MG RE SUPP
RECTAL | Status: AC
  Filled 2019-08-08: qty 2

## 2019-08-08 MED ORDER — ONDANSETRON HCL 4 MG/2ML IJ SOLN
INTRAMUSCULAR | Status: DC | PRN
  Administered 2019-08-08: 4 mg via INTRAVENOUS

## 2019-08-08 MED ORDER — FENTANYL CITRATE (PF) 100 MCG/2ML IJ SOLN
INTRAMUSCULAR | Status: DC | PRN
  Administered 2019-08-08: 50 ug via INTRAVENOUS

## 2019-08-08 MED ORDER — LIDOCAINE HCL (CARDIAC) PF 100 MG/5ML IV SOSY
PREFILLED_SYRINGE | INTRAVENOUS | Status: DC | PRN
  Administered 2019-08-08: 100 mg via INTRAVENOUS

## 2019-08-08 MED ORDER — ROCURONIUM BROMIDE 100 MG/10ML IV SOLN
INTRAVENOUS | Status: DC | PRN
  Administered 2019-08-08: 30 mg via INTRAVENOUS

## 2019-08-08 MED ORDER — SUCCINYLCHOLINE CHLORIDE 20 MG/ML IJ SOLN
INTRAMUSCULAR | Status: DC | PRN
  Administered 2019-08-08: 100 mg via INTRAVENOUS

## 2019-08-08 MED ORDER — FENTANYL CITRATE (PF) 100 MCG/2ML IJ SOLN
INTRAMUSCULAR | Status: AC
  Filled 2019-08-08: qty 2

## 2019-08-08 MED ORDER — ONDANSETRON HCL 4 MG/2ML IJ SOLN: 4.0000 mg | Freq: Once | INTRAMUSCULAR | Status: DC | PRN

## 2019-08-08 MED ORDER — FENTANYL CITRATE (PF) 100 MCG/2ML IJ SOLN
25.0000 ug | INTRAMUSCULAR | Status: DC | PRN
  Administered 2019-08-08: 25 ug via INTRAVENOUS

## 2019-08-08 MED ORDER — PROPOFOL 10 MG/ML IV BOLUS
INTRAVENOUS | Status: DC | PRN
  Administered 2019-08-08: 150 mg via INTRAVENOUS

## 2019-08-08 MED ORDER — SODIUM CHLORIDE 0.9 % IV SOLN
INTRAVENOUS | Status: DC
  Administered 2019-08-08: 12:00:00 via INTRAVENOUS

## 2019-08-08 MED ORDER — SUGAMMADEX SODIUM 200 MG/2ML IV SOLN
INTRAVENOUS | Status: DC | PRN
  Administered 2019-08-08: 200 mg via INTRAVENOUS

## 2019-08-08 NOTE — Anesthesia Procedure Notes (Signed)
Procedure Name: Intubation Date/Time: 08/08/2019 12:45 PM Performed by: Hedda Slade, CRNA Pre-anesthesia Checklist: Patient identified, Patient being monitored, Timeout performed, Emergency Drugs available and Suction available Patient Re-evaluated:Patient Re-evaluated prior to induction Oxygen Delivery Method: Circle system utilized Preoxygenation: Pre-oxygenation with 100% oxygen Induction Type: IV induction Ventilation: Mask ventilation without difficulty Laryngoscope Size: 3 and McGraph Grade View: Grade I Tube type: Oral Tube size: 7.0 mm Number of attempts: 1 Airway Equipment and Method: Stylet Placement Confirmation: ETT inserted through vocal cords under direct vision,  positive ETCO2 and breath sounds checked- equal and bilateral Secured at: 21 cm Tube secured with: Tape Dental Injury: Teeth and Oropharynx as per pre-operative assessment

## 2019-08-08 NOTE — Anesthesia Preprocedure Evaluation (Signed)
Anesthesia Evaluation  Patient identified by MRN, date of birth, ID band Patient awake    Reviewed: Allergy & Precautions, NPO status , Patient's Chart, lab work & pertinent test results, reviewed documented beta blocker date and time   Airway Mallampati: III  TM Distance: >3 FB     Dental  (+) Chipped, Upper Dentures, Partial Lower   Pulmonary asthma , sleep apnea , COPD, Current Smoker,           Cardiovascular hypertension, Pt. on medications      Neuro/Psych PSYCHIATRIC DISORDERS Depression  Neuromuscular disease CVA, No Residual Symptoms    GI/Hepatic   Endo/Other    Renal/GU      Musculoskeletal   Abdominal   Peds  Hematology   Anesthesia Other Findings Runs low sats. Smokes. Has recovered from CVA. EF 55-60. EKG ok. Has had ARF in the past.  Reproductive/Obstetrics                             Anesthesia Physical Anesthesia Plan  ASA: III  Anesthesia Plan: General   Post-op Pain Management:    Induction: Intravenous  PONV Risk Score and Plan:   Airway Management Planned: Oral ETT  Additional Equipment:   Intra-op Plan:   Post-operative Plan:   Informed Consent: I have reviewed the patients History and Physical, chart, labs and discussed the procedure including the risks, benefits and alternatives for the proposed anesthesia with the patient or authorized representative who has indicated his/her understanding and acceptance.       Plan Discussed with: CRNA  Anesthesia Plan Comments:         Anesthesia Quick Evaluation

## 2019-08-08 NOTE — Transfer of Care (Signed)
Immediate Anesthesia Transfer of Care Note  Patient: Traci Cross  Procedure(s) Performed: ENDOSCOPIC RETROGRADE CHOLANGIOPANCREATOGRAPHY (ERCP) (N/A )  Patient Location: PACU  Anesthesia Type:General  Level of Consciousness: sedated  Airway & Oxygen Therapy: Patient Spontanous Breathing and Patient connected to face mask oxygen  Post-op Assessment: Report given to RN and Post -op Vital signs reviewed and stable  Post vital signs: Reviewed and stable  Last Vitals:  Vitals Value Taken Time  BP 110/95 08/08/19 1319  Temp 36.2 C 08/08/19 1319  Pulse 89 08/08/19 1319  Resp 12 08/08/19 1319  SpO2 100 % 08/08/19 1319  Vitals shown include unvalidated device data.  Last Pain:  Vitals:   08/08/19 1131  TempSrc: Tympanic  PainSc: 0-No pain         Complications: No apparent anesthesia complications

## 2019-08-08 NOTE — Op Note (Signed)
[facility] Gastroenterology Patient Name: Traci Cross Procedure Date: 08/08/2019 12:34 PM MRN: [id number] Account #: [id number] Date of Birth: 1960/12/21 Admit Type: Outpatient Age: 58 Room: [facility] ENDO ROOM 4 Gender: Female Note Status: Finalized Procedure:            ERCP Indications:          Bile duct stone(s) Providers:            Lucilla Lame MD, MD Medicines:            General Anesthesia Complications:        No immediate complications. Procedure:            Pre-Anesthesia Assessment:                       - Prior to the procedure, a History and Physical was                        performed, and patient medications and allergies were                        reviewed. The patient's tolerance of previous                        anesthesia was also reviewed. The risks and benefits of                        the procedure and the sedation options and risks were                        discussed with the patient. All questions were                        answered, and informed consent was obtained. Prior                        Anticoagulants: The patient has taken no previous                        anticoagulant or antiplatelet agents. ASA Grade                        Assessment: II - A patient with mild systemic disease.                        After reviewing the risks and benefits, the patient was                        deemed in satisfactory condition to undergo the                        procedure.                       After obtaining informed consent, the scope was passed                        under direct vision. Throughout the procedure, the                        patient's blood pressure, pulse, and oxygen  saturations                        were monitored continuously. The Duodenoscope was                        introduced through the mouth, and used to inject                        contrast into and used to inject contrast into the bile        duct. The ERCP was accomplished without difficulty. The                        patient tolerated the procedure well. Findings:      A scout film of the abdomen was obtained. Surgical clips, consistent       with a previous cholecystectomy, were seen in the area of the right       upper quadrant of the abdomen. The esophagus was successfully intubated       under direct vision. The scope was advanced to a normal major papilla in       the descending duodenum without detailed examination of the pharynx,       larynx and associated structures, and upper GI tract. The upper GI tract       was grossly normal. The bile duct was deeply cannulated with the       short-nosed traction sphincterotome. Contrast was injected. I personally       interpreted the bile duct images. There was brisk flow of contrast       through the ducts. Image quality was excellent. Contrast extended to the       entire biliary tree. The lower third of the main bile duct contained one       stone, which was 10 mm in diameter. A wire was passed into the biliary       tree. A 10 mm biliary sphincterotomy was made with a traction (standard)       sphincterotome using ERBE electrocautery. There was no       post-sphincterotomy bleeding. The biliary tree was swept with a 15 mm       balloon starting at the bifurcation. One stone was removed. No stones       remained. Impression:           - Choledocholithiasis was found. Complete removal was                        accomplished by biliary sphincterotomy and balloon                        extraction.                       - A biliary sphincterotomy was performed.                       - The biliary tree was swept. Recommendation:       - Discharge patient to home.                       - Clear liquid diet today.                       -  Watch for pancreatitis, bleeding, perforation, and                        cholangitis.                       - Continue present  medications. Procedure Code(s):    --- Professional ---                       [phone], Endoscopic retrograde cholangiopancreatography                        (ERCP); with removal of calculi/debris from                        biliary/pancreatic duct(s)                       43262, Endoscopic retrograde cholangiopancreatography                        (ERCP); with sphincterotomy/papillotomy                       [phone], Endoscopic catheterization of the biliary ductal                        system, radiological supervision and interpretation Diagnosis Code(s):    --- Professional ---                       K80.50, Calculus of bile duct without cholangitis or                        cholecystitis without obstruction CPT copyright 2019 American Medical Association. All rights reserved. The codes documented in this report are preliminary and upon coder review may  be revised to meet current compliance requirements. Midge Minium MD, MD 08/08/2019 1:09:58 PM This report has been signed electronically. Number of Addenda: 0 Note Initiated On: 08/08/2019 12:34 PM Estimated Blood Loss: Estimated blood loss: none.      [facility]

## 2019-08-08 NOTE — Anesthesia Postprocedure Evaluation (Signed)
Anesthesia Post Note  Patient: Traci Cross  Procedure(s) Performed: ENDOSCOPIC RETROGRADE CHOLANGIOPANCREATOGRAPHY (ERCP) (N/A )  Patient location during evaluation: Endoscopy Anesthesia Type: General Level of consciousness: awake and alert Pain management: pain level controlled Vital Signs Assessment: post-procedure vital signs reviewed and stable Respiratory status: spontaneous breathing, nonlabored ventilation, respiratory function stable and patient connected to nasal cannula oxygen Cardiovascular status: blood pressure returned to baseline and stable Postop Assessment: no apparent nausea or vomiting Anesthetic complications: no     Last Vitals:  Vitals:   08/08/19 1346 08/08/19 1406  BP: (!) 150/96 (!) 139/93  Pulse:  78  Resp:    Temp: 36.4 C   SpO2:  96%    Last Pain:  Vitals:   08/08/19 1406  TempSrc:   PainSc: 0-No pain                 Faizan Geraci S

## 2019-08-08 NOTE — Anesthesia Post-op Follow-up Note (Signed)
Anesthesia QCDR form completed.        

## 2019-08-08 NOTE — H&P (Signed)
Midge Minium, MD [facility] [address] [place], [place] [address] [phone] Fax : [phone]  Primary Care Physician:  Johnna Acosta, NP Primary Gastroenterologist:  Dr. Servando Snare  Pre-Procedure History & Physical: HPI:  Traci Cross is a 58 y.o. female is here for an ERCP.   Past Medical History:  Diagnosis Date  . Asthma   . COPD (chronic obstructive pulmonary disease) (HCC)   . CVA (cerebral vascular accident) (HCC) 04/19/2019  . Depression   . Hypertension   . Sleep apnea   . Tobacco abuse     Past Surgical History:  Procedure Laterality Date  . CHOLECYSTECTOMY    . LAPAROSCOPIC APPENDECTOMY N/A 06/29/2019   Procedure: APPENDECTOMY LAPAROSCOPIC cecectomy;  Surgeon: Leafy Ro, MD;  Location: [facility] ORS;  Service: General;  Laterality: N/A;  . TONSILLECTOMY      Prior to Admission medications   Medication Sig Start Date End Date Taking? Authorizing Provider  acetaminophen (TYLENOL) 500 MG tablet Take 1,000 mg by mouth as needed.   Yes [provider]  albuterol (VENTOLIN HFA) 108 (90 Base) MCG/ACT inhaler Inhale 2 puffs into the lungs every 6 (six) hours as needed for wheezing or shortness of breath. 03/27/19  Yes Johnna Acosta, NP  aspirin EC 81 MG tablet Take 1 tablet (81 mg total) by mouth daily. 04/19/19 04/18/20 Yes Shaune Pollack, MD  atorvastatin (LIPITOR) 40 MG tablet Take 1 tablet (40 mg total) by mouth daily at 6 PM. 04/20/19  Yes Shaune Pollack, MD  budesonide-formoterol [facility]) 160-4.5 MCG/ACT inhaler Inhale 2 puffs into the lungs 2 (two) times daily. 04/30/19  Yes Scarboro, Coralee North, NP  celecoxib (CELEBREX) 100 MG capsule Take 100 mg by mouth 2 (two) times daily.   Yes [provider]  cyclobenzaprine (FLEXERIL) 5 MG tablet Take 1 tablet (5 mg total) by mouth 3 (three) times daily as needed for muscle spasms. 07/08/19  Yes Lynden Oxford R, PA-C  gabapentin (NEURONTIN) 600 MG tablet Take 1 tablet (600 mg total) by mouth 3 (three)  times daily. 07/01/19  Yes Donovan Kail, PA-C  hydrochlorothiazide (HYDRODIURIL) 25 MG tablet TAKE 1 TABLET(25 MG) BY MOUTH DAILY 08/02/19  Yes Scarboro, Coralee North, NP  zolpidem (AMBIEN) 5 MG tablet Take 1 tablet (5 mg total) by mouth at bedtime as needed for sleep. 07/03/19  Yes Scarboro, Coralee North, NP  oxyCODONE (OXY IR/ROXICODONE) 5 MG immediate release tablet Take 1 tablet (5 mg total) by mouth every 4 (four) hours as needed for severe pain. Patient not taking: Reported on 08/08/2019 07/25/19   Donovan Kail, PA-C    Allergies as of 08/05/2019 - Review Complete 08/05/2019  Allergen Reaction Noted  . Levaquin [levofloxacin] Hives 03/25/2015  . Codeine Hives 08/12/2015  . Hydrocodone Hives 06/28/2019  . Penicillins Hives 08/12/2015    Family History  Problem Relation Age of Onset  . Heart failure Mother   . Stroke Mother   . Alzheimer's disease Mother   . Heart failure Father   . Stroke Father   . Dementia Father     Social History   Socioeconomic History  . Marital status: Single    Spouse name: Not on file  . Number of children: Not on file  . Years of education: Not on file  . Highest education level: Not on file  Occupational History  . Not on file  Social Needs  . Financial resource strain: Not on file  . Food insecurity  Worry: Not on file    Inability: Not on file  . Transportation needs    Medical: Not on file    Non-medical: Not on file  Tobacco Use  . Smoking status: Current Every Day Smoker    Packs/day: 0.25    Years: 35.00    Pack years: 8.75    Types: Cigarettes  . Smokeless tobacco: Never Used  Substance and Sexual Activity  . Alcohol use: No    Alcohol/week: 0.0 standard drinks  . Drug use: No  . Sexual activity: Not on file  Lifestyle  . Physical activity    Days per week: Not on file    Minutes per session: Not on file  . Stress: Not on file  Relationships  . Social Herbalist on phone: Not on file    Gets together: Not on  file    Attends religious service: Not on file    Active member of club or organization: Not on file    Attends meetings of clubs or organizations: Not on file    Relationship status: Not on file  . Intimate partner violence    Fear of current or ex partner: Not on file    Emotionally abused: Not on file    Physically abused: Not on file    Forced sexual activity: Not on file  Other Topics Concern  . Not on file  Social History Narrative   Lives at home alone   Right handed   Caffeine: up to a 2L/day of soda     Review of Systems: See HPI, otherwise negative ROS  Physical Exam: BP (!) 133/99   Pulse 79   Temp 97.9 F (36.6 C) (Tympanic)   Resp 16   Ht 5' 7.5" (1.715 m)   Wt 89.1 kg   SpO2 99%   BMI 30.31 kg/m  General:   Alert,  pleasant and cooperative in NAD Head:  Normocephalic and atraumatic. Neck:  Supple; no masses or thyromegaly. Lungs:  Clear throughout to auscultation.    Heart:  Regular rate and rhythm. Abdomen:  Soft, nontender and nondistended. Normal bowel sounds, without guarding, and without rebound.   Neurologic:  Alert and  oriented x4;  grossly normal neurologically.  Impression/Plan: Traci Cross is here for an ERCP to be performed for cbd stone  Risks, benefits, limitations, and alternatives regarding  ERCP have been reviewed with the patient.  Questions have been answered.  All parties agreeable.   Lucilla Lame, MD  08/08/2019, 12:45 PM

## 2019-08-13 ENCOUNTER — Other Ambulatory Visit: Payer: Self-pay

## 2019-08-13 MED ORDER — ATORVASTATIN CALCIUM 40 MG PO TABS: 40.0000 mg | ORAL_TABLET | Freq: Every day | ORAL | 1 refills | Status: DC

## 2019-08-14 ENCOUNTER — Telehealth: Payer: Self-pay

## 2019-08-14 ENCOUNTER — Other Ambulatory Visit: Payer: Self-pay | Admitting: Adult Health

## 2019-08-14 NOTE — Telephone Encounter (Signed)
FAXED RECORD REQUEST TO [facility]. 08-14-19.

## 2019-08-15 ENCOUNTER — Other Ambulatory Visit: Payer: Self-pay

## 2019-08-15 ENCOUNTER — Encounter: Payer: Self-pay | Admitting: Nurse Practitioner

## 2019-08-15 ENCOUNTER — Ambulatory Visit (INDEPENDENT_AMBULATORY_CARE_PROVIDER_SITE_OTHER): Payer: Medicaid Other | Admitting: Nurse Practitioner

## 2019-08-15 VITALS — BP 139/80 | HR 95 | Temp 98.1°F | Resp 16 | Ht 67.0 in | Wt 190.0 lb

## 2019-08-15 DIAGNOSIS — J449 Chronic obstructive pulmonary disease, unspecified: Secondary | ICD-10-CM | POA: Diagnosis not present

## 2019-08-15 DIAGNOSIS — R002 Palpitations: Secondary | ICD-10-CM | POA: Diagnosis not present

## 2019-08-15 NOTE — Progress Notes (Signed)
[facility] [address] [place], [place] [address]  Internal MEDICINE  Office Visit Note  Patient Name: Traci Cross  [id number]  [id number]  Date of Service: 08/17/2019  Chief Complaint  Patient presents with  . Atrial Flutter    going on for about a week     Patient is here today with complaints of heart palpitations that have been ongoing for 1 week. States the fluttering sensation is intermittent with periods of not being present but starts up again and has occurred every day for the last week. Denies chest pain or increased shortness of breath. Denies caffeine consumption, increase in amount of cigarettes smoked or any recent changes in her medications. Recent echocardiogram in 7/20 revealed normal heart functioning. EKG from today revealed NSR, same as baseline EKG performed in hospital.      Current Medication: Outpatient Encounter Medications as of 08/15/2019  Medication Sig  . acetaminophen (TYLENOL) 500 MG tablet Take 1,000 mg by mouth as needed.  Marland Kitchen albuterol (VENTOLIN HFA) 108 (90 Base) MCG/ACT inhaler INHALE 2 PUFFS INTO THE LUNGS EVERY 6 HOURS AS NEEDED FOR WHEEZING OR SHORTNESS OF BREATH  . aspirin EC 81 MG tablet Take 1 tablet (81 mg total) by mouth daily.  Marland Kitchen atorvastatin (LIPITOR) 40 MG tablet Take 1 tablet (40 mg total) by mouth daily at 6 PM.  . budesonide-formoterol (SYMBICORT) 160-4.5 MCG/ACT inhaler Inhale 2 puffs into the lungs 2 (two) times daily.  . celecoxib (CELEBREX) 100 MG capsule Take 100 mg by mouth 2 (two) times daily.  . cyclobenzaprine (FLEXERIL) 5 MG tablet Take 1 tablet (5 mg total) by mouth 3 (three) times daily as needed for muscle spasms.  Marland Kitchen gabapentin (NEURONTIN) 600 MG tablet Take 1 tablet (600 mg total) by mouth 3 (three) times daily.  . hydrochlorothiazide (HYDRODIURIL) 25 MG tablet TAKE 1 TABLET(25 MG) BY MOUTH DAILY  . oxyCODONE (OXY IR/ROXICODONE) 5 MG immediate release tablet Take 1 tablet (5 mg total) by mouth every 4  (four) hours as needed for severe pain.  Marland Kitchen zolpidem (AMBIEN) 5 MG tablet Take 1 tablet (5 mg total) by mouth at bedtime as needed for sleep.   No facility-administered encounter medications on file as of 08/15/2019.     Surgical History: Past Surgical History:  Procedure Laterality Date  . CHOLECYSTECTOMY    . ERCP N/A 08/08/2019   Procedure: ENDOSCOPIC RETROGRADE CHOLANGIOPANCREATOGRAPHY (ERCP);  Surgeon: Midge Minium, MD;  Location: [facility] ENDOSCOPY;  Service: Endoscopy;  Laterality: N/A;  . LAPAROSCOPIC APPENDECTOMY N/A 06/29/2019   Procedure: APPENDECTOMY LAPAROSCOPIC cecectomy;  Surgeon: Leafy Ro, MD;  Location: [facility] ORS;  Service: General;  Laterality: N/A;  . TONSILLECTOMY      Medical History: Past Medical History:  Diagnosis Date  . Asthma   . COPD (chronic obstructive pulmonary disease) (HCC)   . CVA (cerebral vascular accident) (HCC) 04/19/2019  . Depression   . Hypertension   . Sleep apnea   . Tobacco abuse     Family History: Family History  Problem Relation Age of Onset  . Heart failure Mother   . Stroke Mother   . Alzheimer's disease Mother   . Heart failure Father   . Stroke Father   . Dementia Father     Social History   Socioeconomic History  . Marital status: Single    Spouse name: Not on file  . Number of children: Not on file  . Years of education: Not on file  . Highest education level:  Not on file  Occupational History  . Not on file  Social Needs  . Financial resource strain: Not on file  . Food insecurity    Worry: Not on file    Inability: Not on file  . Transportation needs    Medical: Not on file    Non-medical: Not on file  Tobacco Use  . Smoking status: Current Every Day Smoker    Packs/day: 0.25    Years: 35.00    Pack years: 8.75    Types: Cigarettes  . Smokeless tobacco: Never Used  Substance and Sexual Activity  . Alcohol use: No    Alcohol/week: 0.0 standard drinks  . Drug use: No  . Sexual activity: Not on  file  Lifestyle  . Physical activity    Days per week: Not on file    Minutes per session: Not on file  . Stress: Not on file  Relationships  . Social Herbalist on phone: Not on file    Gets together: Not on file    Attends religious service: Not on file    Active member of club or organization: Not on file    Attends meetings of clubs or organizations: Not on file    Relationship status: Not on file  . Intimate partner violence    Fear of current or ex partner: Not on file    Emotionally abused: Not on file    Physically abused: Not on file    Forced sexual activity: Not on file  Other Topics Concern  . Not on file  Social History Narrative   Lives at home alone   Right handed   Caffeine: up to a 2L/day of soda       Review of Systems  Constitutional: Positive for fatigue. Negative for activity change and appetite change.  HENT: Negative for congestion, postnasal drip, rhinorrhea, sinus pressure and sinus pain.   Respiratory: Positive for shortness of breath. Negative for wheezing.   Cardiovascular: Positive for palpitations. Negative for chest pain.  Gastrointestinal: Negative for constipation, diarrhea, nausea and vomiting.  Endocrine: Negative for cold intolerance, heat intolerance, polydipsia and polyuria.  Skin: Negative for rash.  Allergic/Immunologic: Negative for environmental allergies.  Neurological: Negative for dizziness and headaches.  Psychiatric/Behavioral: The patient is nervous/anxious.     Today's Vitals   08/15/19 1357  BP: 139/80  Pulse: 95  Resp: 16  Temp: 98.1 F (36.7 C)  SpO2: 95%  Weight: 190 lb (86.2 kg)  Height: 5\' 7"  (1.702 m)   Body mass index is 29.76 kg/m.  Physical Exam Vitals signs and nursing note reviewed.  Constitutional:      Appearance: Normal appearance.  HENT:     Head: Normocephalic and atraumatic.     Nose: Nose normal.  Eyes:     Pupils: Pupils are equal, round, and reactive to light.  Neck:      Musculoskeletal: Normal range of motion and neck supple.  Cardiovascular:     Rate and Rhythm: Normal rate and regular rhythm.     Pulses: Normal pulses.     Heart sounds: Normal heart sounds.     Comments: ECG essentially normal  Pulmonary:     Effort: Pulmonary effort is normal.     Breath sounds: Normal breath sounds.  Abdominal:     Palpations: Abdomen is soft.  Neurological:     Mental Status: She is alert.  Psychiatric:        Attention and Perception: Attention and  perception normal.        Mood and Affect: Affect normal. Mood is anxious.        Speech: Speech normal.        Behavior: Behavior normal. Behavior is cooperative.        Thought Content: Thought content normal.        Cognition and Memory: Cognition normal.        Judgment: Judgment normal.   Assessment/Plan:  1. Palpitations - EKG 12-Lead essentially normal. Unchanged from prior ECGs. Will monitor.   2. Chronic obstructive pulmonary disease, unspecified COPD type (HCC) Continue to use inhalers and respiratory medications as prescribed.   General Counseling: Leigh Aurorallen verbalizes understanding of the findings of todays visit and agrees with plan of treatment. I have discussed any further diagnostic evaluation that may be needed or ordered today. We also reviewed her medications today. she has been encouraged to call the office with any questions or concerns that should arise related to todays visit.  Cardiac risk factor modification:  1. Control blood pressure. 2. Exercise as prescribed. 3. Follow low sodium, low fat diet. and low fat and low cholestrol diet. 4. Take ASA 81mg  once a day. 5. Restricted calories diet to lose weight.  This patient was seen by Vincent GrosHeather Etna Forquer FNP Collaboration with Dr Lyndon CodeFozia M Khan as a part of collaborative care agreement  Orders Placed This Encounter  Procedures  . EKG 12-Lead      Time spent: 7230 Minutes      Dr Lyndon CodeFozia M Khan Internal medicine

## 2019-08-17 DIAGNOSIS — R002 Palpitations: Secondary | ICD-10-CM | POA: Insufficient documentation

## 2019-08-22 ENCOUNTER — Ambulatory Visit: Payer: Medicaid Other | Admitting: Adult Health

## 2019-08-22 ENCOUNTER — Other Ambulatory Visit: Payer: Self-pay

## 2019-08-22 MED ORDER — ATORVASTATIN CALCIUM 40 MG PO TABS: 40.0000 mg | ORAL_TABLET | Freq: Every day | ORAL | 1 refills | Status: DC

## 2019-09-11 ENCOUNTER — Telehealth: Payer: Self-pay

## 2019-09-11 NOTE — Telephone Encounter (Signed)
Denied  For zolpidem as per adam pt need to seen for refills

## 2019-09-16 ENCOUNTER — Other Ambulatory Visit: Payer: Self-pay | Admitting: Adult Health

## 2019-09-16 DIAGNOSIS — J449 Chronic obstructive pulmonary disease, unspecified: Secondary | ICD-10-CM

## 2019-09-16 NOTE — Telephone Encounter (Signed)
Labs/ cxr/pft

## 2019-09-17 ENCOUNTER — Ambulatory Visit: Payer: Medicaid Other | Admitting: Gastroenterology

## 2019-09-17 ENCOUNTER — Other Ambulatory Visit: Payer: Self-pay

## 2019-09-17 ENCOUNTER — Encounter: Payer: Self-pay | Admitting: Gastroenterology

## 2019-09-17 VITALS — BP 111/84 | HR 93 | Temp 97.6°F | Ht 67.0 in | Wt 202.8 lb

## 2019-09-17 DIAGNOSIS — R110 Nausea: Secondary | ICD-10-CM

## 2019-09-17 MED ORDER — PANTOPRAZOLE SODIUM 40 MG PO TBEC: 40.0000 mg | DELAYED_RELEASE_TABLET | Freq: Every day | ORAL | 6 refills | Status: DC

## 2019-09-17 NOTE — Progress Notes (Signed)
Primary Care Physician: Johnna Acosta, NP  Primary Gastroenterologist:  Dr. Midge Minium  Chief Complaint  Patient presents with   Follow-up    hospital follow up     HPI: Traci Cross is a 58 y.o. female here for follow up after an ERCP. The patient had the procedure done for a stone in the CBD and had it removed. The patient reports that she had vomiting after each meal after the procedure for a few weeks but less frequently now. She also denies any weight loss with this vomiting.  Current Outpatient Medications  Medication Sig Dispense Refill   acetaminophen (TYLENOL) 500 MG tablet Take 1,000 mg by mouth as needed.     albuterol (VENTOLIN HFA) 108 (90 Base) MCG/ACT inhaler INHALE 2 PUFFS INTO THE LUNGS EVERY 6 HOURS AS NEEDED FOR WHEEZING OR SHORTNESS OF BREATH 54 g 1   aspirin EC 81 MG tablet Take 1 tablet (81 mg total) by mouth daily. 30 tablet 11   atorvastatin (LIPITOR) 40 MG tablet TAKE ONE TABLET EVERY DAY AT 6PM 30 tablet 0   budesonide-formoterol (SYMBICORT) 80-4.5 MCG/ACT inhaler Inhale 2 puffs into the lungs 2 (two) times daily. For copd 1 Inhaler 0   celecoxib (CELEBREX) 100 MG capsule Take 100 mg by mouth 2 (two) times daily.     cyclobenzaprine (FLEXERIL) 5 MG tablet Take 1 tablet (5 mg total) by mouth 3 (three) times daily as needed for muscle spasms. 30 tablet 0   gabapentin (NEURONTIN) 600 MG tablet Take 1 tablet (600 mg total) by mouth 3 (three) times daily. 60 tablet 0   hydrochlorothiazide (HYDRODIURIL) 25 MG tablet TAKE 1 TABLET(25 MG) BY MOUTH DAILY 90 tablet 3   zolpidem (AMBIEN) 5 MG tablet Take 1 tablet (5 mg total) by mouth at bedtime as needed for sleep. 10 tablet 0   oxyCODONE (OXY IR/ROXICODONE) 5 MG immediate release tablet Take 1 tablet (5 mg total) by mouth every 4 (four) hours as needed for severe pain. (Patient not taking: Reported on 09/17/2019) 20 tablet 0   pantoprazole (PROTONIX) 40 MG tablet Take 1 tablet (40 mg total) by mouth  daily before breakfast. 30 tablet 6   No current facility-administered medications for this visit.     Allergies as of 09/17/2019 - Review Complete 09/17/2019  Allergen Reaction Noted   Levaquin [levofloxacin] Hives 03/25/2015   Codeine Hives 08/12/2015   Hydrocodone Hives 06/28/2019   Penicillins Hives 08/12/2015    ROS:  General: Negative for anorexia, weight loss, fever, chills, fatigue, weakness. ENT: Negative for hoarseness, difficulty swallowing , nasal congestion. CV: Negative for chest pain, angina, palpitations, dyspnea on exertion, peripheral edema.  Respiratory: Negative for dyspnea at rest, dyspnea on exertion, cough, sputum, wheezing.  GI: See history of present illness. GU:  Negative for dysuria, hematuria, urinary incontinence, urinary frequency, nocturnal urination.  Endo: Negative for unusual weight change.    Physical Examination:   BP 111/84 (BP Location: Left Wrist, Patient Position: Sitting)    Pulse 93    Temp 97.6 F (36.4 C) (Oral)    Ht 5\' 7"  (1.702 m)    Wt 202 lb 12.8 oz (92 kg)    BMI 31.76 kg/m   General: Well-nourished, well-developed in no acute distress.  Eyes: No icterus. Conjunctivae pink. Extremities: No lower extremity edema. No clubbing or deformities. Neuro: Alert and oriented x 3.  Grossly intact. Skin: Warm and dry, no jaundice.   Psych: Alert and cooperative, normal mood and  affect.  Labs:    Imaging Studies: No results found.  Assessment and Plan:   Traci Cross is a 58 y.o. y/o female who comes in for a follow up after an ERCP with vomiting since. The patient will be started in a PPI to see if the vomiting is related to GERD. The patient has been told the plan and agrees with it.     Traci Lame, MD. Marval Regal    Note: This dictation was prepared with Dragon dictation along with smaller phrase technology. Any transcriptional errors that result from this process are unintentional.

## 2019-09-19 NOTE — Telephone Encounter (Signed)
This pt needs follow up, Can you check please

## 2019-11-08 ENCOUNTER — Other Ambulatory Visit: Payer: Self-pay

## 2019-11-08 MED ORDER — HYDROCHLOROTHIAZIDE 25 MG PO TABS: ORAL_TABLET | ORAL | 0 refills | Status: DC

## 2020-02-04 ENCOUNTER — Ambulatory Visit: Payer: Medicaid Other | Admitting: Student in an Organized Health Care Education/Training Program

## 2020-02-04 ENCOUNTER — Other Ambulatory Visit: Payer: Self-pay

## 2020-02-04 DIAGNOSIS — J449 Chronic obstructive pulmonary disease, unspecified: Secondary | ICD-10-CM

## 2020-02-04 MED ORDER — ALBUTEROL SULFATE HFA 108 (90 BASE) MCG/ACT IN AERS: INHALATION_SPRAY | RESPIRATORY_TRACT | 0 refills | Status: DC

## 2020-02-04 MED ORDER — HYDROCHLOROTHIAZIDE 25 MG PO TABS: ORAL_TABLET | ORAL | 0 refills | Status: DC

## 2020-02-04 MED ORDER — BUDESONIDE-FORMOTEROL FUMARATE 80-4.5 MCG/ACT IN AERO: 2.0000 | INHALATION_SPRAY | Freq: Two times a day (BID) | RESPIRATORY_TRACT | 0 refills | Status: DC

## 2020-02-10 ENCOUNTER — Other Ambulatory Visit: Payer: Self-pay | Admitting: Adult Health

## 2020-02-10 DIAGNOSIS — J449 Chronic obstructive pulmonary disease, unspecified: Secondary | ICD-10-CM

## 2020-02-20 ENCOUNTER — Telehealth: Payer: Self-pay

## 2020-02-20 NOTE — Telephone Encounter (Signed)
Completed record request and mailed requested records to [facility]. Placed copy in scan.

## 2020-02-25 ENCOUNTER — Other Ambulatory Visit: Payer: Self-pay

## 2020-02-25 DIAGNOSIS — J449 Chronic obstructive pulmonary disease, unspecified: Secondary | ICD-10-CM

## 2020-02-25 MED ORDER — BUDESONIDE-FORMOTEROL FUMARATE 80-4.5 MCG/ACT IN AERO: 2.0000 | INHALATION_SPRAY | Freq: Two times a day (BID) | RESPIRATORY_TRACT | 0 refills | Status: DC

## 2020-03-10 ENCOUNTER — Other Ambulatory Visit: Payer: Self-pay

## 2020-03-10 ENCOUNTER — Ambulatory Visit
Payer: Medicaid Other | Attending: Student in an Organized Health Care Education/Training Program | Admitting: Student in an Organized Health Care Education/Training Program

## 2020-03-10 ENCOUNTER — Encounter: Payer: Self-pay | Admitting: Student in an Organized Health Care Education/Training Program

## 2020-03-10 VITALS — BP 135/97 | HR 85 | Temp 97.6°F | Ht 67.5 in | Wt 198.0 lb

## 2020-03-10 DIAGNOSIS — M47812 Spondylosis without myelopathy or radiculopathy, cervical region: Secondary | ICD-10-CM | POA: Diagnosis present

## 2020-03-10 DIAGNOSIS — G8929 Other chronic pain: Secondary | ICD-10-CM | POA: Diagnosis present

## 2020-03-10 DIAGNOSIS — M5416 Radiculopathy, lumbar region: Secondary | ICD-10-CM

## 2020-03-10 DIAGNOSIS — M47816 Spondylosis without myelopathy or radiculopathy, lumbar region: Secondary | ICD-10-CM | POA: Insufficient documentation

## 2020-03-10 DIAGNOSIS — G894 Chronic pain syndrome: Secondary | ICD-10-CM

## 2020-03-10 DIAGNOSIS — M5412 Radiculopathy, cervical region: Secondary | ICD-10-CM | POA: Insufficient documentation

## 2020-03-10 NOTE — Progress Notes (Signed)
Safety precautions to be maintained throughout the outpatient stay will include: orient to surroundings, keep bed in low position, maintain call bell within reach at all times, provide assistance with transfer out of bed and ambulation.  

## 2020-03-10 NOTE — Progress Notes (Signed)
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 detected.  Muscle Tone/Strength: Functionally intact. No obvious neuro-muscular anomalies detected.  Sensory (Neurological): Dermatomal pain pattern          Sensory (Neurological): Dermatomal pain pattern          Palpation: No palpable anomalies              Palpation: No palpable anomalies              Provocative Test(s):  Phalen's test: deferred Tinel's test: deferred Apley's scratch test (touch opposite shoulder):  Action 1 (Across chest): deferred Action 2 (Overhead): deferred Action 3 (LB reach): deferred   Provocative Test(s):  Phalen's test: deferred Tinel's test: deferred Apley's scratch test (touch opposite shoulder):  Action 1 (Across chest): deferred Action 2 (Overhead): deferred Action 3 (LB reach): deferred     Thoracic Spine Area Exam  Skin & Axial Inspection: No masses, redness, or swelling Alignment: Symmetrical Functional ROM: Unrestricted ROM Stability: No instability detected Muscle Tone/Strength: Functionally intact. No obvious neuro-muscular anomalies detected. Sensory (Neurological): Unimpaired Muscle strength & Tone: No palpable anomalies  Lumbar Exam  Skin & Axial Inspection: No masses, redness, or swelling Alignment: Symmetrical Functional ROM: Pain restricted ROM affecting both sides Stability: No instability detected Muscle Tone/Strength: Functionally  intact. No obvious neuro-muscular anomalies detected. Sensory (Neurological): Dermatomal pain pattern  Gait & Posture Assessment  Ambulation: Limited Gait: Relatively normal for age and body habitus Posture: Difficulty standing up straight, due to pain   Lower Extremity Exam    Side: Right lower extremity  Side: Left lower extremity  Stability: No instability observed          Stability: No instability observed          Skin & Extremity Inspection: Skin color, temperature, and hair growth are WNL. No peripheral edema or cyanosis. No masses, redness, swelling, asymmetry, or associated skin lesions. No contractures.  Skin & Extremity Inspection: Skin color, temperature, and hair growth are WNL. No peripheral edema or cyanosis. No masses, redness, swelling, asymmetry, or associated skin lesions. No contractures.  Functional ROM: Unrestricted ROM                  Functional ROM: Unrestricted ROM                  Muscle Tone/Strength: Functionally intact. No obvious neuro-muscular anomalies detected.  Muscle Tone/Strength: Functionally intact. No obvious neuro-muscular anomalies detected.  Sensory (Neurological): Unimpaired        Sensory (Neurological): Unimpaired        DTR: Patellar: deferred today Achilles: deferred today Plantar: deferred today  DTR: Patellar: deferred today Achilles: deferred today Plantar: deferred  today  Palpation: No palpable anomalies  Palpation: No palpable anomalies   Assessment  Primary Diagnosis & Pertinent Problem List: The primary encounter diagnosis was Chronic radicular lumbar pain. Diagnoses of Cervical radicular pain, Cervical facet joint syndrome, and Chronic pain syndrome were also pertinent to this visit.  Visit Diagnosis (New problems to examiner): 1. Chronic radicular lumbar pain   2. Cervical radicular pain   3. Cervical facet joint syndrome   4. Chronic pain syndrome    Plan of Care (Initial workup plan)   Patient instructed to follow-up with Dr. Sharlet Salina for her neck pain.  She is status post left cervical transforaminal epidural steroid injection on 03/06/2020.  If she is having increased neck pain that is gotten worse after the injection, I informed her that she needs to follow-up with the physician that performed it.  I informed the patient that the physical medicine rehab team does indeed do medication management for chronic pain which includes both opioid and nonopioid analgesics.  Given that she has established care with them for interventional injections, it is better that she continue to follow-up with them.  She thinks she is supposed to have cervical spine surgery but has not been informed of the date.  Provider-requested follow-up: No follow-ups on file.  Future Appointments  Date Time Provider [place]  03/19/2020  2:45 PM Kendell Bane, NP [facility] None    Note by: Gillis Santa, MD Date: 03/10/2020; Time: 3:15 PM

## 2020-03-17 ENCOUNTER — Telehealth: Payer: Self-pay

## 2020-03-17 NOTE — Telephone Encounter (Signed)
Confirmed appointment on 03/19/2020 and screened for covid. [facility] 

## 2020-03-19 ENCOUNTER — Ambulatory Visit: Payer: Medicaid Other | Admitting: Adult Health

## 2020-03-24 ENCOUNTER — Encounter: Payer: Self-pay | Admitting: Adult Health

## 2020-03-24 ENCOUNTER — Ambulatory Visit: Payer: Medicaid Other | Admitting: Adult Health

## 2020-03-24 ENCOUNTER — Other Ambulatory Visit: Payer: Self-pay

## 2020-03-24 VITALS — BP 127/84 | HR 77 | Temp 98.1°F | Resp 16 | Ht 67.0 in | Wt 206.0 lb

## 2020-03-24 DIAGNOSIS — G4700 Insomnia, unspecified: Secondary | ICD-10-CM

## 2020-03-24 DIAGNOSIS — I10 Essential (primary) hypertension: Secondary | ICD-10-CM

## 2020-03-24 DIAGNOSIS — Z1231 Encounter for screening mammogram for malignant neoplasm of breast: Secondary | ICD-10-CM

## 2020-03-24 DIAGNOSIS — J449 Chronic obstructive pulmonary disease, unspecified: Secondary | ICD-10-CM | POA: Diagnosis not present

## 2020-03-24 DIAGNOSIS — E782 Mixed hyperlipidemia: Secondary | ICD-10-CM | POA: Diagnosis not present

## 2020-03-24 MED ORDER — ATORVASTATIN CALCIUM 40 MG PO TABS: 40.0000 mg | ORAL_TABLET | Freq: Every day | ORAL | 2 refills | Status: DC

## 2020-03-24 MED ORDER — ZOLPIDEM TARTRATE 10 MG PO TABS: 5.0000 mg | ORAL_TABLET | Freq: Every evening | ORAL | 1 refills | Status: DC | PRN

## 2020-03-24 NOTE — Progress Notes (Signed)
[facility] [address] [place], [place] [address]  Internal MEDICINE  Office Visit Note  Patient Name: Traci Cross  [id number]  [id number]  Date of Service: 03/24/2020  Chief Complaint  Patient presents with  . Hypertension  . Depression  . Quality Metric Gaps    Annual wellness,pap,mammogram    HPI  Pt is here for follow up on depression, and HTN.  She reports her neck and back pain remain unchanged.  She was sent to pain management who say they "can not help her" because she "had injections with psychiatry and not them". Her blood pressure is well controlled.  She is due to CPE, Pap and mamogram.  All will be scheduled today. She reports her insomnia is intermittently worse.  She would like to change her [place].  She currently takes 5mg .  We discussed increase in dose.    Current Medication: Outpatient Encounter Medications as of 03/24/2020  Medication Sig  . acetaminophen (TYLENOL) 500 MG tablet Take 1,000 mg by mouth as needed.  05/24/2020 albuterol (VENTOLIN HFA) 108 (90 Base) MCG/ACT inhaler INHALE 2 PUFFS INTO THE LUNGS EVERY 6 HOURS AS NEEDED FOR WHEEZING OR SHORTNESS OF BREATH  . aspirin EC 81 MG tablet Take 1 tablet (81 mg total) by mouth daily.  Marland Kitchen atorvastatin (LIPITOR) 40 MG tablet Take 1 tablet (40 mg total) by mouth daily.  . budesonide-formoterol (SYMBICORT) 80-4.5 MCG/ACT inhaler Inhale 2 puffs into the lungs 2 (two) times daily. For copd  . celecoxib (CELEBREX) 100 MG capsule Take 100 mg by mouth 2 (two) times daily.  . cyclobenzaprine (FLEXERIL) 5 MG tablet Take 1 tablet (5 mg total) by mouth 3 (three) times daily as needed for muscle spasms.  . hydrochlorothiazide (HYDRODIURIL) 25 MG tablet TAKE 1 TABLET(25 MG) BY MOUTH DAILY  . zolpidem (AMBIEN) 10 MG tablet Take 0.5 tablets (5 mg total) by mouth at bedtime as needed for sleep.  . [DISCONTINUED] atorvastatin (LIPITOR) 40 MG tablet TAKE ONE TABLET EVERY DAY AT 6PM  . [DISCONTINUED] zolpidem (AMBIEN) 5 MG  tablet Take 1 tablet (5 mg total) by mouth at bedtime as needed for sleep.  . pantoprazole (PROTONIX) 40 MG tablet Take 1 tablet (40 mg total) by mouth daily before breakfast. (Patient not taking: Reported on 03/10/2020)  . [DISCONTINUED] gabapentin (NEURONTIN) 600 MG tablet Take 1 tablet (600 mg total) by mouth 3 (three) times daily. (Patient not taking: Reported on 03/10/2020)  . [DISCONTINUED] oxyCODONE (OXY IR/ROXICODONE) 5 MG immediate release tablet Take 1 tablet (5 mg total) by mouth every 4 (four) hours as needed for severe pain. (Patient not taking: Reported on 09/17/2019)   No facility-administered encounter medications on file as of 03/24/2020.    Surgical History: Past Surgical History:  Procedure Laterality Date  . CHOLECYSTECTOMY    . ERCP N/A 08/08/2019   Procedure: ENDOSCOPIC RETROGRADE CHOLANGIOPANCREATOGRAPHY (ERCP);  Surgeon: 08/10/2019, MD;  Location: [facility] ENDOSCOPY;  Service: Endoscopy;  Laterality: N/A;  . LAPAROSCOPIC APPENDECTOMY N/A 06/29/2019   Procedure: APPENDECTOMY LAPAROSCOPIC cecectomy;  Surgeon: 08/29/2019, MD;  Location: [facility] ORS;  Service: General;  Laterality: N/A;  . TONSILLECTOMY      Medical History: Past Medical History:  Diagnosis Date  . Asthma   . COPD (chronic obstructive pulmonary disease) (HCC)   . CVA (cerebral vascular accident) (HCC) 04/19/2019  . Depression   . Hypertension   . Sleep apnea   . Tobacco abuse     Family History: Family History  Problem  Relation Age of Onset  . Heart failure Mother   . Stroke Mother   . Alzheimer's disease Mother   . Heart failure Father   . Stroke Father   . Dementia Father     Social History   Socioeconomic History  . Marital status: Single    Spouse name: Not on file  . Number of children: Not on file  . Years of education: Not on file  . Highest education level: Not on file  Occupational History  . Not on file  Tobacco Use  . Smoking status: Current Every Day Smoker    Packs/day:  0.25    Years: 35.00    Pack years: 8.75    Types: Cigarettes  . Smokeless tobacco: Never Used  Substance and Sexual Activity  . Alcohol use: No    Alcohol/week: 0.0 standard drinks  . Drug use: No  . Sexual activity: Not on file  Other Topics Concern  . Not on file  Social History Narrative   Lives at home alone   Right handed   Caffeine: up to a 2L/day of soda    Social Determinants of Health   Financial Resource Strain:   . Difficulty of Paying Living Expenses:   Food Insecurity:   . Worried About Programme researcher, broadcasting/film/video in the Last Year:   . Barista in the Last Year:   Transportation Needs:   . Freight forwarder (Medical):   Marland Kitchen Lack of Transportation (Non-Medical):   Physical Activity:   . Days of Exercise per Week:   . Minutes of Exercise per Session:   Stress:   . Feeling of Stress :   Social Connections:   . Frequency of Communication with Friends and Family:   . Frequency of Social Gatherings with Friends and Family:   . Attends Religious Services:   . Active Member of Clubs or Organizations:   . Attends Banker Meetings:   Marland Kitchen Marital Status:   Intimate Partner Violence:   . Fear of Current or Ex-Partner:   . Emotionally Abused:   Marland Kitchen Physically Abused:   . Sexually Abused:       Review of Systems  Constitutional: Negative for chills, fatigue and unexpected weight change.  HENT: Negative for congestion, rhinorrhea, sneezing and sore throat.   Eyes: Negative for photophobia, pain and redness.  Respiratory: Negative for cough, chest tightness and shortness of breath.   Cardiovascular: Negative for chest pain and palpitations.  Gastrointestinal: Negative for abdominal pain, constipation, diarrhea, nausea and vomiting.  Endocrine: Negative.   Genitourinary: Negative for dysuria and frequency.  Musculoskeletal: Negative for arthralgias, back pain, joint swelling and neck pain.  Skin: Negative for rash.  Allergic/Immunologic: Negative.    Neurological: Negative for tremors and numbness.  Hematological: Negative for adenopathy. Does not bruise/bleed easily.  Psychiatric/Behavioral: Negative for behavioral problems and sleep disturbance. The patient is not nervous/anxious.     Vital Signs: BP 127/84   Pulse 77   Temp 98.1 F (36.7 C)   Resp 16   Ht 5\' 7"  (1.702 m)   Wt 206 lb (93.4 kg)   SpO2 98%   BMI 32.26 kg/m    Physical Exam Vitals and nursing note reviewed.  Constitutional:      General: She is not in acute distress.    Appearance: She is well-developed. She is not diaphoretic.  HENT:     Head: Normocephalic and atraumatic.     Mouth/Throat:  Pharynx: No oropharyngeal exudate.  Eyes:     Pupils: Pupils are equal, round, and reactive to light.  Neck:     Thyroid: No thyromegaly.     Vascular: No JVD.     Trachea: No tracheal deviation.  Cardiovascular:     Rate and Rhythm: Normal rate and regular rhythm.     Heart sounds: Normal heart sounds. No murmur. No friction rub. No gallop.   Pulmonary:     Effort: Pulmonary effort is normal. No respiratory distress.     Breath sounds: Normal breath sounds. No wheezing or rales.  Chest:     Chest wall: No tenderness.  Abdominal:     Palpations: Abdomen is soft.     Tenderness: There is no abdominal tenderness. There is no guarding.  Musculoskeletal:        General: Normal range of motion.     Cervical back: Normal range of motion and neck supple.  Lymphadenopathy:     Cervical: No cervical adenopathy.  Skin:    General: Skin is warm and dry.  Neurological:     Mental Status: She is alert and oriented to person, place, and time.     Cranial Nerves: No cranial nerve deficit.  Psychiatric:        Behavior: Behavior normal.        Thought Content: Thought content normal.        Judgment: Judgment normal.    Assessment/Plan: 1. Insomnia, unspecified type Increase dose to 10mg   - zolpidem (AMBIEN) 10 MG tablet; Take 0.5 tablets (5 mg total) by  mouth at bedtime as needed for sleep.  Dispense: 15 tablet; Refill: 1  2. Mixed hyperlipidemia Continue lipitor as directed.  - atorvastatin (LIPITOR) 40 MG tablet; Take 1 tablet (40 mg total) by mouth daily.  Dispense: 90 tablet; Refill: 2  3. Encounter for screening mammogram for breast cancer - MM DIGITAL SCREENING BILATERAL; Future  4. Chronic obstructive pulmonary disease, unspecified COPD type ([place]) Stable, at this time.   5. Essential hypertension Controlled, continue present management.   General Counseling: lainie daubert understanding of the findings of todays visit and agrees with plan of treatment. I have discussed any further diagnostic evaluation that may be needed or ordered today. We also reviewed her medications today. she has been encouraged to call the office with any questions or concerns that should arise related to todays visit.    Orders Placed This Encounter  Procedures  . MM DIGITAL SCREENING BILATERAL    Meds ordered this encounter  Medications  . zolpidem (AMBIEN) 10 MG tablet    Sig: Take 0.5 tablets (5 mg total) by mouth at bedtime as needed for sleep.    Dispense:  15 tablet    Refill:  1  . atorvastatin (LIPITOR) 40 MG tablet    Sig: Take 1 tablet (40 mg total) by mouth daily.    Dispense:  90 tablet    Refill:  2    FOR NEXT FILL. [place]    Time spent: 30 Minutes   This patient was seen by Orson Gear AGNP-C in Collaboration with Dr Lavera Guise as a part of collaborative care agreement     Kendell Bane AGNP-C Internal medicine

## 2020-03-30 ENCOUNTER — Telehealth: Payer: Self-pay | Admitting: *Deleted

## 2020-03-30 NOTE — Telephone Encounter (Signed)
Late entry from 03/26/20. Request received for Dr. Trevor Mace input on functional limitations. Pt no longer being seen by Dr. Lucia Gaskins. No longer a patient in our neurology clinic. Pt was seen once July 2020 and was released from neurology. Dr. Lucia Gaskins declined form and it was faxed back to [facility] vocational rehab services. Received a receipt of confirmation.

## 2020-04-17 ENCOUNTER — Telehealth: Payer: Self-pay

## 2020-04-17 NOTE — Telephone Encounter (Signed)
Confirmed and screened for 04-21-20 ov. 

## 2020-04-21 ENCOUNTER — Other Ambulatory Visit: Payer: Self-pay

## 2020-04-21 ENCOUNTER — Ambulatory Visit: Payer: Medicaid Other | Admitting: Nurse Practitioner

## 2020-04-21 VITALS — BP 130/76 | HR 84 | Temp 97.3°F | Resp 16 | Ht 67.0 in | Wt 204.0 lb

## 2020-04-21 DIAGNOSIS — F17219 Nicotine dependence, cigarettes, with unspecified nicotine-induced disorders: Secondary | ICD-10-CM | POA: Diagnosis not present

## 2020-04-21 DIAGNOSIS — R5383 Other fatigue: Secondary | ICD-10-CM

## 2020-04-21 DIAGNOSIS — I10 Essential (primary) hypertension: Secondary | ICD-10-CM

## 2020-04-21 DIAGNOSIS — Z0001 Encounter for general adult medical examination with abnormal findings: Secondary | ICD-10-CM | POA: Diagnosis not present

## 2020-04-21 DIAGNOSIS — Z124 Encounter for screening for malignant neoplasm of cervix: Secondary | ICD-10-CM | POA: Diagnosis not present

## 2020-04-21 DIAGNOSIS — R300 Dysuria: Secondary | ICD-10-CM

## 2020-04-21 DIAGNOSIS — J449 Chronic obstructive pulmonary disease, unspecified: Secondary | ICD-10-CM | POA: Diagnosis not present

## 2020-04-21 MED ORDER — NICOTINE 21 MG/24HR TD PT24: 21.0000 mg | MEDICATED_PATCH | Freq: Every day | TRANSDERMAL | 1 refills | Status: DC

## 2020-04-21 NOTE — Progress Notes (Signed)
[facility] [address] [place], [place] [address]  Internal MEDICINE  Office Visit Note  Patient Name: Traci Cross  [id number]  [id number]  Date of Service: 04/26/2020  Chief Complaint  Patient presents with  . Annual Exam    Papsmear  . Depression  . Hypertension  . COPD     The patient presents for health maintenance exam and pap smear. She does have cervical disc disease which causes her to have radiculopathy into both arms. She sees orthopedics/spine specialist for this. She takes celebrex to reduce pain and inflammation. She is a smoker. States that she smokes at least a pack of cigarettes per day. She is interested in quitting. Has used nicotine patches in the past which did help her to cut back. Would like to try this again. She is due to have routine, fating labs done. Mammogram ordered at her last visit in 03/2020. She still needs to schedule this test.   Pt is here for routine health maintenance examination  Current Medication: Outpatient Encounter Medications as of 04/21/2020  Medication Sig  . acetaminophen (TYLENOL) 500 MG tablet Take 1,000 mg by mouth as needed.  Marland Kitchen albuterol (VENTOLIN HFA) 108 (90 Base) MCG/ACT inhaler INHALE 2 PUFFS INTO THE LUNGS EVERY 6 HOURS AS NEEDED FOR WHEEZING OR SHORTNESS OF BREATH  . atorvastatin (LIPITOR) 40 MG tablet Take 1 tablet (40 mg total) by mouth daily.  . budesonide-formoterol (SYMBICORT) 80-4.5 MCG/ACT inhaler Inhale 2 puffs into the lungs 2 (two) times daily. For copd  . celecoxib (CELEBREX) 100 MG capsule Take 100 mg by mouth 2 (two) times daily.  . cyclobenzaprine (FLEXERIL) 5 MG tablet Take 1 tablet (5 mg total) by mouth 3 (three) times daily as needed for muscle spasms.  . hydrochlorothiazide (HYDRODIURIL) 25 MG tablet TAKE 1 TABLET(25 MG) BY MOUTH DAILY  . pantoprazole (PROTONIX) 40 MG tablet Take 1 tablet (40 mg total) by mouth daily before breakfast.  . zolpidem (AMBIEN) 10 MG tablet Take 0.5 tablets (5 mg  total) by mouth at bedtime as needed for sleep.  . nicotine (NICODERM CQ) 21 mg/24hr patch Place 1 patch (21 mg total) onto the skin daily.   No facility-administered encounter medications on file as of 04/21/2020.    Surgical History: Past Surgical History:  Procedure Laterality Date  . CHOLECYSTECTOMY    . ERCP N/A 08/08/2019   Procedure: ENDOSCOPIC RETROGRADE CHOLANGIOPANCREATOGRAPHY (ERCP);  Surgeon: Midge Minium, MD;  [facility] ENDOSCOPY;  Service: Endoscopy;  Laterality: N/A;  . LAPAROSCOPIC APPENDECTOMY N/A 06/29/2019   Procedure: APPENDECTOMY LAPAROSCOPIC cecectomy;  Surgeon: Leafy Ro, MD;  Location: [facility] ORS;  Service: General;  Laterality: N/A;  . TONSILLECTOMY      Medical History: Past Medical History:  Diagnosis Date  . Asthma   . COPD (chronic obstructive pulmonary disease) (HCC)   . CVA (cerebral vascular accident) (HCC) 04/19/2019  . Depression   . Hypertension   . Sleep apnea   . Tobacco abuse     Family History: Family History  Problem Relation Age of Onset  . Heart failure Mother   . Stroke Mother   . Alzheimer's disease Mother   . Heart failure Father   . Stroke Father   . Dementia Father       Review of Systems  Constitutional: Positive for fatigue. Negative for activity change, chills and unexpected weight change.  HENT: Negative for congestion, postnasal drip, rhinorrhea, sneezing and sore throat.   Respiratory: Negative for cough, chest tightness,  shortness of breath and wheezing.   Cardiovascular: Negative for chest pain and palpitations.  Gastrointestinal: Negative for abdominal pain, constipation, diarrhea, nausea and vomiting.  Endocrine: Negative for cold intolerance, heat intolerance, polydipsia and polyuria.  Genitourinary: Negative for dysuria, frequency, hematuria and urgency.  Musculoskeletal: Positive for arthralgias, back pain, neck pain and neck stiffness. Negative for joint swelling.       Routinely sees  orthopedics/spine specialist for this.   Skin: Negative for rash.  Allergic/Immunologic: Negative for environmental allergies.  Neurological: Negative for dizziness, tremors, numbness and headaches.  Hematological: Negative for adenopathy. Does not bruise/bleed easily.  Psychiatric/Behavioral: Negative for behavioral problems (Depression), sleep disturbance and suicidal ideas. The patient is nervous/anxious.    Today's Vitals   04/21/20 1014  BP: 130/76  Pulse: 84  Resp: 16  Temp: (!) 97.3 F (36.3 C)  SpO2: 96%  Weight: 204 lb (92.5 kg)  Height: 5\' 7"  (1.702 m)   Body mass index is 31.95 kg/m.  Physical Exam Vitals and nursing note reviewed.  Constitutional:      General: She is not in acute distress.    Appearance: Normal appearance. She is well-developed. She is not diaphoretic.  HENT:     Head: Normocephalic and atraumatic.     Nose: Nose normal.     Mouth/Throat:     Pharynx: No oropharyngeal exudate.  Eyes:     Pupils: Pupils are equal, round, and reactive to light.  Neck:     Thyroid: No thyromegaly.     Vascular: No carotid bruit or JVD.     Trachea: No tracheal deviation.  Cardiovascular:     Rate and Rhythm: Normal rate and regular rhythm.     Pulses: Normal pulses.     Heart sounds: Normal heart sounds. No murmur heard.  No friction rub. No gallop.   Pulmonary:     Effort: Pulmonary effort is normal. No respiratory distress.     Breath sounds: Normal breath sounds. No wheezing or rales.  Chest:     Chest wall: No tenderness.     Breasts:        Right: Normal. No swelling, bleeding, inverted nipple, mass, nipple discharge, skin change or tenderness.        Left: Normal. No swelling, bleeding, inverted nipple, mass, nipple discharge, skin change or tenderness.  Abdominal:     General: Bowel sounds are normal.     Palpations: Abdomen is soft.     Tenderness: There is no abdominal tenderness.     Hernia: There is no hernia in the left inguinal area or  right inguinal area.  Genitourinary:    General: Normal vulva.     Labia:        Right: No tenderness, lesion or injury.        Left: No tenderness, lesion or injury.      Vagina: No vaginal discharge, erythema, tenderness or bleeding.     Cervix: No discharge, friability, erythema or cervical bleeding.     Uterus: Normal.      Adnexa: Right adnexa normal and left adnexa normal.     Comments: No tenderness, masses, or organomeglay present during bimanual exam . Musculoskeletal:        General: Normal range of motion.     Cervical back: Normal range of motion and neck supple. Torticollis present. Pain with movement present.  Lymphadenopathy:     Cervical: No cervical adenopathy.     Upper Body:     Right upper body:  No axillary adenopathy.     Left upper body: No axillary adenopathy.     Lower Body: No right inguinal adenopathy. No left inguinal adenopathy.  Skin:    General: Skin is warm and dry.  Neurological:     Mental Status: She is alert and oriented to person, place, and time.     Cranial Nerves: No cranial nerve deficit.  Psychiatric:        Behavior: Behavior normal.        Thought Content: Thought content normal.        Judgment: Judgment normal.     LABS: Recent Results (from the past 2160 hour(s))  IGP, Aptima HPV     Status: None   Collection Time: 04/21/20 10:13 AM  Result Value Ref Range   Interpretation NILM     Comment: NEGATIVE FOR INTRAEPITHELIAL LESION OR MALIGNANCY.   Category NIL     Comment: Negative for Intraepithelial Lesion   Adequacy ENDO     Comment: Satisfactory for evaluation. Endocervical and/or squamous metaplastic cells (endocervical component) are present.    Clinician Provided ICD10 Comment     Comment: Z12.4   Performed by: Comment     Comment: Calton Dachamara Adibi, Supervisory Cytotechnologist (ASCP)   Note: Comment     Comment: The Pap smear is a screening test designed to aid in the detection of premalignant and malignant conditions of  the uterine cervix.  It is not a diagnostic procedure and should not be used as the sole means of detecting cervical cancer.  Both false-positive and false-negative reports do occur.    Test Methodology Comment     Comment: This liquid based ThinPrep(R) pap test was screened with the use of an image guided system.    HPV Aptima Negative Negative    Comment: This nucleic acid amplification test detects fourteen high-risk HPV types (16,18,31,33,35,39,45,51,52,56,58,59,66,68) without differentiation.   Urinalysis, Routine w reflex microscopic     Status: None   Collection Time: 04/21/20 10:14 AM  Result Value Ref Range   Specific Gravity, UA 1.018 1.005 - 1.030   pH, UA 6.0 5.0 - 7.5   Color, UA Yellow Yellow   Appearance Ur Clear Clear   Leukocytes,UA Negative Negative   Protein,UA Negative Negative/Trace   Glucose, UA Negative Negative   Ketones, UA Negative Negative   RBC, UA Negative Negative   Bilirubin, UA Negative Negative   Urobilinogen, Ur 0.2 0.2 - 1.0 mg/dL   Nitrite, UA Negative Negative   Microscopic Examination Comment     Comment: Microscopic not indicated and not performed.    Assessment/Plan: 1. Encounter for general adult medical examination with abnormal findings Annual health maintenance exam with pap smear today.   2. Essential hypertension Stable. Continue bp medication as prescribed   3. Chronic obstructive pulmonary disease, unspecified COPD type (HCC) Well managed. Continue symbicort twice daily and rescue inhaler as needed and as prescribed   4. Cigarette nicotine dependence with nicotine-induced disorder Start nicoderm CQ transdermal patches. Instructions for use given. Patient voiced understanding.  - nicotine (NICODERM CQ) 21 mg/24hr patch; Place 1 patch (21 mg total) onto the skin daily.  Dispense: 30 patch; Refill: 1  5. Other fatigue Check labs including thyroid and iron panels.   6. Routine cervical smear - IGP, Aptima HPV  7.  Dysuria - Urinalysis, Routine w reflex microscopic  General Counseling: Leigh Aurorallen verbalizes understanding of the findings of todays visit and agrees with plan of treatment. I have discussed any further diagnostic evaluation  that may be needed or ordered today. We also reviewed her medications today. she has been encouraged to call the office with any questions or concerns that should arise related to todays visit.    Counseling:  Smoking cessation counseling: 1. Pt acknowledges the risks of long term smoking, she will try to quite smoking. 2. Options for different medications including nicotine products, chewing gum, patch etc, Wellbutrin and Chantix is discussed 3. Goal and date of compete cessation is discussed 4. Total time spent in smoking cessation is 15 min.  This patient was seen by Vincent Gros FNP Collaboration with Dr Lyndon Code as a part of collaborative care agreement  Orders Placed This Encounter  Procedures  . Urinalysis, Routine w reflex microscopic    Meds ordered this encounter  Medications  . nicotine (NICODERM CQ) 21 mg/24hr patch    Sig: Place 1 patch (21 mg total) onto the skin daily.    Dispense:  30 patch    Refill:  1    Order Specific Question:   Supervising Provider    Answer:   Lyndon Code [[id number]]    Total time spent: 45 Minutes  Time spent includes review of chart, medications, test results, and follow up plan with the patient.     Lyndon Code, MD  Internal Medicine

## 2020-04-22 LAB — URINALYSIS, ROUTINE W REFLEX MICROSCOPIC
Bilirubin, UA: NEGATIVE
Glucose, UA: NEGATIVE
Ketones, UA: NEGATIVE
Leukocytes,UA: NEGATIVE
Nitrite, UA: NEGATIVE
Protein,UA: NEGATIVE
RBC, UA: NEGATIVE
Specific Gravity, UA: 1.018 (ref 1.005–1.030)
Urobilinogen, Ur: 0.2 mg/dL (ref 0.2–1.0)
pH, UA: 6 (ref 5.0–7.5)

## 2020-04-23 ENCOUNTER — Other Ambulatory Visit: Payer: Medicaid Other | Admitting: Nurse Practitioner

## 2020-04-23 LAB — IGP, APTIMA HPV: HPV Aptima: NEGATIVE

## 2020-04-23 NOTE — Progress Notes (Signed)
Please let the patient know that her pap smear is normal. thanks

## 2020-04-23 NOTE — Progress Notes (Signed)
Pt advised pap smear is normal  

## 2020-04-26 ENCOUNTER — Encounter: Payer: Self-pay | Admitting: Nurse Practitioner

## 2020-04-26 DIAGNOSIS — F17219 Nicotine dependence, cigarettes, with unspecified nicotine-induced disorders: Secondary | ICD-10-CM | POA: Insufficient documentation

## 2020-04-26 DIAGNOSIS — Z124 Encounter for screening for malignant neoplasm of cervix: Secondary | ICD-10-CM | POA: Insufficient documentation

## 2020-04-26 DIAGNOSIS — R300 Dysuria: Secondary | ICD-10-CM | POA: Insufficient documentation

## 2020-04-26 DIAGNOSIS — R5383 Other fatigue: Secondary | ICD-10-CM | POA: Insufficient documentation

## 2020-04-26 DIAGNOSIS — Z7689 Persons encountering health services in other specified circumstances: Secondary | ICD-10-CM | POA: Insufficient documentation

## 2020-04-26 DIAGNOSIS — Z0001 Encounter for general adult medical examination with abnormal findings: Secondary | ICD-10-CM | POA: Insufficient documentation

## 2020-05-11 ENCOUNTER — Other Ambulatory Visit: Payer: Self-pay

## 2020-05-11 MED ORDER — HYDROCHLOROTHIAZIDE 25 MG PO TABS: ORAL_TABLET | ORAL | 0 refills | Status: DC

## 2020-05-15 ENCOUNTER — Other Ambulatory Visit: Payer: Self-pay | Admitting: Adult Health

## 2020-05-15 DIAGNOSIS — G4700 Insomnia, unspecified: Secondary | ICD-10-CM

## 2020-05-15 NOTE — Telephone Encounter (Signed)
Pt last appt 04/21/20 last refill 03/24/20 w/1 refill.  Needs ambien refill

## 2020-05-20 ENCOUNTER — Telehealth: Payer: Self-pay

## 2020-05-20 NOTE — Telephone Encounter (Signed)
Medical Diagnosis confirmation request form reviewed and signed by provider and faxed back to [facility] of Health and Human services and placed in scan.

## 2020-06-03 ENCOUNTER — Ambulatory Visit: Payer: Medicaid Other | Admitting: Hospice and Palliative Medicine

## 2020-06-03 ENCOUNTER — Encounter: Payer: Self-pay | Admitting: Hospice and Palliative Medicine

## 2020-06-03 ENCOUNTER — Other Ambulatory Visit: Payer: Self-pay

## 2020-06-03 DIAGNOSIS — J449 Chronic obstructive pulmonary disease, unspecified: Secondary | ICD-10-CM

## 2020-06-03 DIAGNOSIS — F17219 Nicotine dependence, cigarettes, with unspecified nicotine-induced disorders: Secondary | ICD-10-CM | POA: Diagnosis not present

## 2020-06-03 DIAGNOSIS — G4700 Insomnia, unspecified: Secondary | ICD-10-CM | POA: Diagnosis not present

## 2020-06-03 MED ORDER — BUDESONIDE-FORMOTEROL FUMARATE 80-4.5 MCG/ACT IN AERO: 2.0000 | INHALATION_SPRAY | Freq: Two times a day (BID) | RESPIRATORY_TRACT | 3 refills | Status: DC

## 2020-06-03 MED ORDER — ALBUTEROL SULFATE HFA 108 (90 BASE) MCG/ACT IN AERS: INHALATION_SPRAY | RESPIRATORY_TRACT | 3 refills | Status: DC

## 2020-06-03 MED ORDER — TRAZODONE HCL 50 MG PO TABS: 25.0000 mg | ORAL_TABLET | Freq: Every evening | ORAL | 0 refills | Status: DC | PRN

## 2020-06-03 MED ORDER — BUPROPION HCL ER (SR) 150 MG PO TB12: 150.0000 mg | ORAL_TABLET | Freq: Two times a day (BID) | ORAL | 0 refills | Status: DC

## 2020-06-03 NOTE — Progress Notes (Signed)
[facility] [address] [place], [place] [address]  Internal MEDICINE  Office Visit Note  Patient Name: Traci Cross  [id number]  [id number]  Date of Service: 06/03/2020  Chief Complaint  Patient presents with  . Acute Visit    paperwork, pt has trouble staying asleep  . Quality Metric Gaps    TDAP, mammogram    HPI She is here today for routine follow-up. Continues to have issues with sleeping. Was recently started on Ambien to assist with sleep disturbance. Says she has noticed some improvement but does not like that this medication is a controlled substance and has abuse potential. She says that she has some issues with falling asleep but has further issues with waking up in the middle of the night and falling back to sleep. Also wants to discuss other options such as Chantix for smoking cessation. Has given Nicotine patches several opportunities to help and she has not noticed any help from them in curbing her cravings for a cigarette. She also has many complications with getting the patches to adhere to her skin. Discussed the issue with Chantix further causing her sleep disturbances. BP stable today Requests that I complete paper work for her to renew her handicap sticker for parking   Current Medication: Outpatient Encounter Medications as of 06/03/2020  Medication Sig  . acetaminophen (TYLENOL) 500 MG tablet Take 1,000 mg by mouth as needed.  Marland Kitchen albuterol (VENTOLIN HFA) 108 (90 Base) MCG/ACT inhaler INHALE 2 PUFFS INTO THE LUNGS EVERY 6 HOURS AS NEEDED FOR WHEEZING OR SHORTNESS OF BREATH  . atorvastatin (LIPITOR) 40 MG tablet Take 1 tablet (40 mg total) by mouth daily.  . budesonide-formoterol (SYMBICORT) 80-4.5 MCG/ACT inhaler Inhale 2 puffs into the lungs 2 (two) times daily. For copd  . celecoxib (CELEBREX) 100 MG capsule Take 100 mg by mouth 2 (two) times daily.  . hydrochlorothiazide (HYDRODIURIL) 25 MG tablet TAKE 1 TABLET(25 MG) BY MOUTH DAILY  .  nicotine (NICODERM CQ) 21 mg/24hr patch Place 1 patch (21 mg total) onto the skin daily.  . [DISCONTINUED] albuterol (VENTOLIN HFA) 108 (90 Base) MCG/ACT inhaler INHALE 2 PUFFS INTO THE LUNGS EVERY 6 HOURS AS NEEDED FOR WHEEZING OR SHORTNESS OF BREATH  . [DISCONTINUED] budesonide-formoterol (SYMBICORT) 80-4.5 MCG/ACT inhaler Inhale 2 puffs into the lungs 2 (two) times daily. For copd  . [DISCONTINUED] cyclobenzaprine (FLEXERIL) 5 MG tablet Take 1 tablet (5 mg total) by mouth 3 (three) times daily as needed for muscle spasms.  . [DISCONTINUED] pantoprazole (PROTONIX) 40 MG tablet Take 1 tablet (40 mg total) by mouth daily before breakfast.  . [DISCONTINUED] zolpidem (AMBIEN) 10 MG tablet TAKE 1/2 TABLET AT BEDTIME AS NEEDED FORSLEEP  . buPROPion (WELLBUTRIN SR) 150 MG 12 hr tablet Take 1 tablet (150 mg total) by mouth 2 (two) times daily.  . traZODone (DESYREL) 50 MG tablet Take 0.5-1 tablets (25-50 mg total) by mouth at bedtime as needed for sleep.   No facility-administered encounter medications on file as of 06/03/2020.    Surgical History: Past Surgical History:  Procedure Laterality Date  . CHOLECYSTECTOMY    . ERCP N/A 08/08/2019   Procedure: ENDOSCOPIC RETROGRADE CHOLANGIOPANCREATOGRAPHY (ERCP);  Surgeon: Midge Minium, MD;  Location[facility] ENDOSCOPY;  Service: Endoscopy;  Laterality: N/A;  . LAPAROSCOPIC APPENDECTOMY N/A 06/29/2019   Procedure: APPENDECTOMY LAPAROSCOPIC cecectomy;  Surgeon: Leafy Ro, MD;  Location: [facility] ORS;  Service: General;  Laterality: N/A;  . TONSILLECTOMY      Medical History: Past Medical  History:  Diagnosis Date  . Asthma   . COPD (chronic obstructive pulmonary disease) (HCC)   . CVA (cerebral vascular accident) (HCC) 04/19/2019  . Depression   . Hypertension   . Sleep apnea   . Tobacco abuse     Family History: Family History  Problem Relation Age of Onset  . Heart failure Mother   . Stroke Mother   . Alzheimer's disease Mother   . Heart  failure Father   . Stroke Father   . Dementia Father     Social History   Socioeconomic History  . Marital status: Single    Spouse name: Not on file  . Number of children: Not on file  . Years of education: Not on file  . Highest education level: Not on file  Occupational History  . Not on file  Tobacco Use  . Smoking status: Current Every Day Smoker    Packs/day: 0.25    Years: 35.00    Pack years: 8.75    Types: Cigarettes  . Smokeless tobacco: Never Used  Vaping Use  . Vaping Use: Never used  Substance and Sexual Activity  . Alcohol use: No    Alcohol/week: 0.0 standard drinks  . Drug use: No  . Sexual activity: Not on file  Other Topics Concern  . Not on file  Social History Narrative   Lives at home alone   Right handed   Caffeine: up to a 2L/day of soda    Social Determinants of Health   Financial Resource Strain:   . Difficulty of Paying Living Expenses:   Food Insecurity:   . Worried About Programme researcher, broadcasting/film/videounning Out of Food in the Last Year:   . Baristaan Out of Food in the Last Year:   Transportation Needs:   . Freight forwarderLack of Transportation (Medical):   Marland Kitchen. Lack of Transportation (Non-Medical):   Physical Activity:   . Days of Exercise per Week:   . Minutes of Exercise per Session:   Stress:   . Feeling of Stress :   Social Connections:   . Frequency of Communication with Friends and Family:   . Frequency of Social Gatherings with Friends and Family:   . Attends Religious Services:   . Active Member of Clubs or Organizations:   . Attends BankerClub or Organization Meetings:   Marland Kitchen. Marital Status:   Intimate Partner Violence:   . Fear of Current or Ex-Partner:   . Emotionally Abused:   Marland Kitchen. Physically Abused:   . Sexually Abused:       Review of Systems  Constitutional: Negative for chills, diaphoresis and fatigue.  HENT: Negative for congestion and sinus pressure.   Eyes: Negative for photophobia and visual disturbance.  Respiratory: Negative for cough, shortness of breath and  wheezing.   Cardiovascular: Negative for chest pain, palpitations and leg swelling.  Gastrointestinal: Negative for abdominal pain, constipation, diarrhea, nausea and vomiting.  Genitourinary: Negative for dysuria and flank pain.  Musculoskeletal: Negative for arthralgias, back pain, gait problem and neck pain.  Skin: Negative for color change.  Allergic/Immunologic: Negative for environmental allergies and food allergies.  Neurological: Negative for dizziness and headaches.  Hematological: Does not bruise/bleed easily.  Psychiatric/Behavioral: Positive for sleep disturbance. Negative for agitation, behavioral problems (depression) and hallucinations. The patient is nervous/anxious.    Vital Signs: BP 129/72   Pulse 89   Temp 97.7 F (36.5 C)   Resp 16   Ht 5\' 7"  (1.702 m)   Wt 203 lb 12.8 oz (92.4 kg)  SpO2 96%   BMI 31.92 kg/m   Physical Exam Constitutional:      Appearance: Normal appearance.  HENT:     Mouth/Throat:     Mouth: Mucous membranes are moist.     Pharynx: Oropharynx is clear.  Cardiovascular:     Rate and Rhythm: Normal rate and regular rhythm.     Pulses: Normal pulses.     Heart sounds: Normal heart sounds.  Pulmonary:     Effort: Pulmonary effort is normal.     Breath sounds: Normal breath sounds.  Abdominal:     General: Abdomen is flat. Bowel sounds are normal.  Musculoskeletal:        General: Normal range of motion.     Cervical back: Normal range of motion.  Skin:    General: Skin is warm.  Neurological:     General: No focal deficit present.     Mental Status: She is alert and oriented to person, place, and time. Mental status is at baseline.  Psychiatric:        Mood and Affect: Mood normal.        Behavior: Behavior normal.        Thought Content: Thought content normal.    Assessment/Plan: 1. Insomnia, unspecified type Discontinue ambien at this time and trial trazodone to help with sleep disturbances. Also discussed trying extended  release melatonin to further assist with insomnia. If sleep disturbance continues to be an issue may need to evaluate for OSA. - traZODone (DESYREL) 50 MG tablet; Take 0.5-1 tablets (25-50 mg total) by mouth at bedtime as needed for sleep.  Dispense: 30 tablet; Refill: 0  2. Cigarette nicotine dependence with nicotine-induced disorder Has tried Nicotine patches with no success. Chantix has potential to further complicate her sleep disturbances. She is persistent that she must quit smoking in order to have surgery on her neck. Will trial bupropion to assist with nicotine cessation. Will reassess in 1 month. No known hx of sz  - buPROPion (WELLBUTRIN SR) 150 MG 12 hr tablet; Take 1 tablet (150 mg total) by mouth 2 (two) times daily.  Dispense: 60 tablet; Refill: 0  3. Chronic obstructive pulmonary disease, unspecified COPD type (HCC) COPD stable at this time, requests refills for inhalers today. - budesonide-formoterol (SYMBICORT) 80-4.5 MCG/ACT inhaler; Inhale 2 puffs into the lungs 2 (two) times daily. For copd  Dispense: 6.9 g; Refill: 3 - albuterol (VENTOLIN HFA) 108 (90 Base) MCG/ACT inhaler; INHALE 2 PUFFS INTO THE LUNGS EVERY 6 HOURS AS NEEDED FOR WHEEZING OR SHORTNESS OF BREATH  Dispense: 54 g; Refill: 3  General Counseling: Ellissa verbalizes understanding of the findings of todays visit and agrees with plan of treatment. I have discussed any further diagnostic evaluation that may be needed or ordered today. We also reviewed her medications today. she has been encouraged to call the office with any questions or concerns that should arise related to todays visit.   Meds ordered this encounter  Medications  . budesonide-formoterol (SYMBICORT) 80-4.5 MCG/ACT inhaler    Sig: Inhale 2 puffs into the lungs 2 (two) times daily. For copd    Dispense:  6.9 g    Refill:  3    Pt needs to be seen in the office  . albuterol (VENTOLIN HFA) 108 (90 Base) MCG/ACT inhaler    Sig: INHALE 2 PUFFS INTO THE  LUNGS EVERY 6 HOURS AS NEEDED FOR WHEEZING OR SHORTNESS OF BREATH    Dispense:  54 g    Refill:  3    PT REQUESTS A 90 DAY SUPPLY  . traZODone (DESYREL) 50 MG tablet    Sig: Take 0.5-1 tablets (25-50 mg total) by mouth at bedtime as needed for sleep.    Dispense:  30 tablet    Refill:  0  . buPROPion (WELLBUTRIN SR) 150 MG 12 hr tablet    Sig: Take 1 tablet (150 mg total) by mouth 2 (two) times daily.    Dispense:  60 tablet    Refill:  0    For first 3 days only take one tablet daily. On fourth day begin taking one tablet twice daily.    Time spent: 30 Minutes   This patient was seen by Leeanne Deed AGNP-C in Collaboration with Dr Lyndon Code as a part of collaborative care agreement     Lubertha Basque. Trimaine Maser AGNP-C Internal medicine

## 2020-06-08 ENCOUNTER — Telehealth: Payer: Self-pay

## 2020-06-09 ENCOUNTER — Telehealth: Payer: Self-pay

## 2020-06-09 NOTE — Telephone Encounter (Signed)
Authorization approved for Budesonide-Formoterol Fumarate 80.4.5.MCG/ACT Aerosol from 06/09/2020 through 06/09/2021 [facility]

## 2020-06-10 ENCOUNTER — Telehealth: Payer: Self-pay

## 2020-06-10 NOTE — Telephone Encounter (Signed)
Pt notified by shanell

## 2020-06-10 NOTE — Telephone Encounter (Signed)
Spoke with PT as per Ladona Ridgel PT was told to double Trazodone intake to 100 mg PT stated she was already taking Two tablets already and it wasn't working. As per Ladona Ridgel PT was advised to be seen PT refused.

## 2020-07-03 ENCOUNTER — Telehealth: Payer: Self-pay

## 2020-07-03 NOTE — Telephone Encounter (Signed)
comfirmed and screened for OV on 9/21

## 2020-07-07 ENCOUNTER — Ambulatory Visit: Payer: Medicaid Other | Admitting: Hospice and Palliative Medicine

## 2020-07-14 ENCOUNTER — Encounter: Payer: Self-pay | Admitting: Hospice and Palliative Medicine

## 2020-07-14 ENCOUNTER — Other Ambulatory Visit: Payer: Self-pay

## 2020-07-14 ENCOUNTER — Ambulatory Visit (INDEPENDENT_AMBULATORY_CARE_PROVIDER_SITE_OTHER): Payer: Medicaid Other | Admitting: Hospice and Palliative Medicine

## 2020-07-14 DIAGNOSIS — F17219 Nicotine dependence, cigarettes, with unspecified nicotine-induced disorders: Secondary | ICD-10-CM

## 2020-07-14 DIAGNOSIS — G8929 Other chronic pain: Secondary | ICD-10-CM

## 2020-07-14 DIAGNOSIS — R079 Chest pain, unspecified: Secondary | ICD-10-CM | POA: Diagnosis not present

## 2020-07-14 DIAGNOSIS — I10 Essential (primary) hypertension: Secondary | ICD-10-CM | POA: Diagnosis not present

## 2020-07-14 DIAGNOSIS — R002 Palpitations: Secondary | ICD-10-CM

## 2020-07-14 DIAGNOSIS — G4700 Insomnia, unspecified: Secondary | ICD-10-CM

## 2020-07-14 DIAGNOSIS — M5412 Radiculopathy, cervical region: Secondary | ICD-10-CM

## 2020-07-14 MED ORDER — BUPROPION HCL ER (SR) 150 MG PO TB12: 150.0000 mg | ORAL_TABLET | Freq: Two times a day (BID) | ORAL | 0 refills | Status: DC

## 2020-07-14 MED ORDER — CELECOXIB 200 MG PO CAPS: 200.0000 mg | ORAL_CAPSULE | Freq: Two times a day (BID) | ORAL | 1 refills | Status: DC

## 2020-07-14 MED ORDER — MIRTAZAPINE 15 MG PO TABS: 15.0000 mg | ORAL_TABLET | Freq: Every day | ORAL | 0 refills | Status: DC

## 2020-07-14 MED ORDER — NICOTINE 21 MG/24HR TD PT24: 21.0000 mg | MEDICATED_PATCH | Freq: Every day | TRANSDERMAL | 1 refills | Status: DC

## 2020-07-14 MED ORDER — BUPROPION HCL ER (SR) 150 MG PO TB12: 150.0000 mg | ORAL_TABLET | Freq: Two times a day (BID) | ORAL | 1 refills | Status: DC

## 2020-07-14 MED ORDER — HYDROCHLOROTHIAZIDE 25 MG PO TABS: ORAL_TABLET | ORAL | 0 refills | Status: DC

## 2020-07-14 NOTE — Progress Notes (Signed)
[facility] [address] [place], [place] [address]  Internal MEDICINE  Office Visit Note  Patient Name: Traci Cross  [id number]  [id number]  Date of Service: 07/14/2020  Chief Complaint  Patient presents with  . Follow-up    pt wants to use a patch to help her stop smoking, pt wants to see a local heart doctor, pt is having problems with her chest, palpatations since she had a stroke 04/19/2019  . Depression  . Hypertension  . Sleep Apnea  . Quality Metric Gaps    Tdap, colonscopy    HPI Patient is here today for routine follow-up She continues to struggle with her sleep She has tried [place] as well as trazodone--these therapies have been unsuccessful at helping with her sleep, she has tried valium in the past and says this has been useful She is requesting a referral to cardiology today, she mentions that she has started having heart palpitations and intermittent chest discomfort, was last seen by cardiologist after her stroke Requesting refills of Celebrex today, she routinely sees [facility] clinic neurosurgery for chronic neck and leg pain--they are considering surgery but also recommend referral to pain management clinic She would like a refill on Nicotine patches, she has not noticed much help with her cravings since starting Wellbutrin She also feels that she is struggling with depression, may notice some help with her symptoms from starting Wellbutrin  Has lab slip to have labs drawn but she has been without a ride, encouraged to have these done when she cans   Current Medication: Outpatient Encounter Medications as of 07/14/2020  Medication Sig  . acetaminophen (TYLENOL) 500 MG tablet Take 1,000 mg by mouth as needed.  Marland Kitchen albuterol (VENTOLIN HFA) 108 (90 Base) MCG/ACT inhaler INHALE 2 PUFFS INTO THE LUNGS EVERY 6 HOURS AS NEEDED FOR WHEEZING OR SHORTNESS OF BREATH  . atorvastatin (LIPITOR) 40 MG tablet Take 1 tablet (40 mg total) by mouth daily.  .  budesonide-formoterol (SYMBICORT) 80-4.5 MCG/ACT inhaler Inhale 2 puffs into the lungs 2 (two) times daily. For copd  . buPROPion (WELLBUTRIN SR) 150 MG 12 hr tablet Take 1 tablet (150 mg total) by mouth 2 (two) times daily.  . hydrochlorothiazide (HYDRODIURIL) 25 MG tablet TAKE 1 TABLET(25 MG) BY MOUTH DAILY  . nicotine (NICODERM CQ) 21 mg/24hr patch Place 1 patch (21 mg total) onto the skin daily.  . traZODone (DESYREL) 50 MG tablet Take 0.5-1 tablets (25-50 mg total) by mouth at bedtime as needed for sleep.  . [DISCONTINUED] buPROPion (WELLBUTRIN SR) 150 MG 12 hr tablet Take 1 tablet (150 mg total) by mouth 2 (two) times daily.  . [DISCONTINUED] buPROPion (WELLBUTRIN SR) 150 MG 12 hr tablet Take 1 tablet (150 mg total) by mouth 2 (two) times daily.  . [DISCONTINUED] celecoxib (CELEBREX) 100 MG capsule Take 100 mg by mouth 2 (two) times daily.  . [DISCONTINUED] hydrochlorothiazide (HYDRODIURIL) 25 MG tablet TAKE 1 TABLET(25 MG) BY MOUTH DAILY  . [DISCONTINUED] nicotine (NICODERM CQ) 21 mg/24hr patch Place 1 patch (21 mg total) onto the skin daily.  . celecoxib (CELEBREX) 200 MG capsule Take 1 capsule (200 mg total) by mouth 2 (two) times daily.  . mirtazapine (REMERON) 15 MG tablet Take 1 tablet (15 mg total) by mouth at bedtime.  . [DISCONTINUED] zolpidem (AMBIEN) 10 MG tablet TAKE 1/2 TABLET AT BEDTIME AS NEEDED FORSLEEP   No facility-administered encounter medications on file as of 07/14/2020.    Surgical History: Past Surgical History:  Procedure  Laterality Date  . CHOLECYSTECTOMY    . ERCP N/A 08/08/2019   Procedure: ENDOSCOPIC RETROGRADE CHOLANGIOPANCREATOGRAPHY (ERCP);  Surgeon: Midge Minium, MD;  Location: [facility] ENDOSCOPY;  Service: Endoscopy;  Laterality: N/A;  . LAPAROSCOPIC APPENDECTOMY N/A 06/29/2019   Procedure: APPENDECTOMY LAPAROSCOPIC cecectomy;  Surgeon: Leafy Ro, MD;  Location: [facility] ORS;  Service: General;  Laterality: N/A;  . TONSILLECTOMY      Medical  History: Past Medical History:  Diagnosis Date  . Asthma   . COPD (chronic obstructive pulmonary disease) (HCC)   . CVA (cerebral vascular accident) (HCC) 04/19/2019  . Depression   . Hypertension   . Sleep apnea   . Tobacco abuse     Family History: Family History  Problem Relation Age of Onset  . Heart failure Mother   . Stroke Mother   . Alzheimer's disease Mother   . Heart failure Father   . Stroke Father   . Dementia Father     Social History   Socioeconomic History  . Marital status: Single    Spouse name: Not on file  . Number of children: Not on file  . Years of education: Not on file  . Highest education level: Not on file  Occupational History  . Not on file  Tobacco Use  . Smoking status: Current Every Day Smoker    Packs/day: 0.25    Years: 35.00    Pack years: 8.75    Types: Cigarettes  . Smokeless tobacco: Never Used  Vaping Use  . Vaping Use: Never used  Substance and Sexual Activity  . Alcohol use: No    Alcohol/week: 0.0 standard drinks  . Drug use: No  . Sexual activity: Not on file  Other Topics Concern  . Not on file  Social History Narrative   Lives at home alone   Right handed   Caffeine: up to a 2L/day of soda    Social Determinants of Health   Financial Resource Strain:   . Difficulty of Paying Living Expenses: Not on file  Food Insecurity:   . Worried About Programme researcher, broadcasting/film/video in the Last Year: Not on file  . Ran Out of Food in the Last Year: Not on file  Transportation Needs:   . Lack of Transportation (Medical): Not on file  . Lack of Transportation (Non-Medical): Not on file  Physical Activity:   . Days of Exercise per Week: Not on file  . Minutes of Exercise per Session: Not on file  Stress:   . Feeling of Stress : Not on file  Social Connections:   . Frequency of Communication with Friends and Family: Not on file  . Frequency of Social Gatherings with Friends and Family: Not on file  . Attends Religious Services:  Not on file  . Active Member of Clubs or Organizations: Not on file  . Attends Banker Meetings: Not on file  . Marital Status: Not on file  Intimate Partner Violence:   . Fear of Current or Ex-Partner: Not on file  . Emotionally Abused: Not on file  . Physically Abused: Not on file  . Sexually Abused: Not on file      Review of Systems  Constitutional: Negative for chills, diaphoresis and fatigue.  HENT: Negative for ear pain, postnasal drip and sinus pressure.   Eyes: Negative for photophobia, discharge, redness, itching and visual disturbance.  Respiratory: Negative for cough, shortness of breath and wheezing.   Cardiovascular: Negative for chest pain, palpitations and leg  swelling.  Gastrointestinal: Negative for abdominal pain, constipation, diarrhea, nausea and vomiting.  Genitourinary: Negative for dysuria and flank pain.  Musculoskeletal: Positive for arthralgias and back pain. Negative for gait problem and neck pain.       Chronic neck and leg pain  Skin: Negative for color change.  Allergic/Immunologic: Negative for environmental allergies and food allergies.  Neurological: Negative for dizziness and headaches.  Hematological: Does not bruise/bleed easily.  Psychiatric/Behavioral: Positive for sleep disturbance. Negative for agitation, behavioral problems (depression) and hallucinations.    Vital Signs: BP 122/88   Pulse 86   Temp 97.8 F (36.6 C)   Resp 16   Ht 5' 7.5" (1.715 m)   Wt 198 lb 3.2 oz (89.9 kg)   SpO2 93%   BMI 30.58 kg/m    Physical Exam Vitals reviewed.  Constitutional:      Appearance: Normal appearance.  HENT:     Mouth/Throat:     Mouth: Mucous membranes are moist.     Pharynx: Oropharynx is clear.  Cardiovascular:     Rate and Rhythm: Normal rate and regular rhythm.     Pulses: Normal pulses.     Heart sounds: Normal heart sounds.  Pulmonary:     Effort: Pulmonary effort is normal.     Breath sounds: Normal breath  sounds.  Abdominal:     General: Abdomen is flat.     Palpations: Abdomen is soft.  Musculoskeletal:        General: Normal range of motion.     Cervical back: Normal range of motion.  Skin:    General: Skin is warm.  Neurological:     General: No focal deficit present.     Mental Status: She is alert and oriented to person, place, and time. Mental status is at baseline.  Psychiatric:        Mood and Affect: Mood normal.        Behavior: Behavior normal.        Thought Content: Thought content normal.    Assessment/Plan: 1. Insomnia, unspecified type Will try mirtazapine to help with insomnia, will avoid controlled substances. Has been tested for OSA, unremarkable - mirtazapine (REMERON) 15 MG tablet; Take 1 tablet (15 mg total) by mouth at bedtime.  Dispense: 30 tablet; Refill: 0  2. Essential hypertension BP and HR stable today on current therapy, requesting refills today. Will continue with routine monitoring. - hydrochlorothiazide (HYDRODIURIL) 25 MG tablet; TAKE 1 TABLET(25 MG) BY MOUTH DAILY  Dispense: 90 tablet; Refill: 0  3. Heart palpitations Normal EKG today, will follow with echocardiogram and cardiology referral per her request - ECHOCARDIOGRAM COMPLETE; Future - Ambulatory referral to Cardiology - EKG 12-Lead  4. Chest pain, unspecified type Will obtain echocardiogram, referral to cardiology, will follow and adjust plan as needed - ECHOCARDIOGRAM COMPLETE; Future - Ambulatory referral to Cardiology  5. Cigarette nicotine dependence with nicotine-induced disorder Continue with Wellbutrin and refill nicotine patches to assist her with smoking cessation May need to consider changing Wellbutrin as this may be contributing to her insomnia if she continues to see no improvement in her cessation - nicotine (NICODERM CQ) 21 mg/24hr patch; Place 1 patch (21 mg total) onto the skin daily.  Dispense: 30 patch; Refill: 1 - buPROPion (WELLBUTRIN SR) 150 MG 12 hr tablet;  Take 1 tablet (150 mg total) by mouth 2 (two) times daily.  Dispense: 180 tablet; Refill: 1  6. Chronic radicular cervical pain Requesting refills today, encouraged to continue to follow-up with  neurosurgery At her last visit it was mentioned she might benefit from pain management referral - celecoxib (CELEBREX) 200 MG capsule; Take 1 capsule (200 mg total) by mouth 2 (two) times daily.  Dispense: 180 capsule; Refill: 1  General Counseling: verenice westrich understanding of the findings of todays visit and agrees with plan of treatment. I have discussed any further diagnostic evaluation that may be needed or ordered today. We also reviewed her medications today. she has been encouraged to call the office with any questions or concerns that should arise related to todays visit.    Orders Placed This Encounter  Procedures  . Ambulatory referral to Cardiology  . EKG 12-Lead  . ECHOCARDIOGRAM COMPLETE    Meds ordered this encounter  Medications  . nicotine (NICODERM CQ) 21 mg/24hr patch    Sig: Place 1 patch (21 mg total) onto the skin daily.    Dispense:  30 patch    Refill:  1  . DISCONTD: buPROPion (WELLBUTRIN SR) 150 MG 12 hr tablet    Sig: Take 1 tablet (150 mg total) by mouth 2 (two) times daily.    Dispense:  60 tablet    Refill:  0    For first 3 days only take one tablet daily. On fourth day begin taking one tablet twice daily.  . hydrochlorothiazide (HYDRODIURIL) 25 MG tablet    Sig: TAKE 1 TABLET(25 MG) BY MOUTH DAILY    Dispense:  90 tablet    Refill:  0  . mirtazapine (REMERON) 15 MG tablet    Sig: Take 1 tablet (15 mg total) by mouth at bedtime.    Dispense:  30 tablet    Refill:  0  . buPROPion (WELLBUTRIN SR) 150 MG 12 hr tablet    Sig: Take 1 tablet (150 mg total) by mouth 2 (two) times daily.    Dispense:  180 tablet    Refill:  1    For first 3 days only take one tablet daily. On fourth day begin taking one tablet twice daily.  . celecoxib (CELEBREX) 200 MG  capsule    Sig: Take 1 capsule (200 mg total) by mouth 2 (two) times daily.    Dispense:  180 capsule    Refill:  1    Time spent: 45 Minutes Time spent includes review of chart, medications, test results and follow-up plan with the patient. High level of complexity of medical problems and decision making   This patient was seen by Leeanne Deed AGNP-C in Collaboration with Dr Lyndon Code as a part of collaborative care agreement     Lubertha Basque. Neshawn Aird AGNP-C Internal medicine

## 2020-07-23 ENCOUNTER — Ambulatory Visit: Payer: Medicaid Other | Admitting: Hospice and Palliative Medicine

## 2020-07-24 ENCOUNTER — Other Ambulatory Visit: Payer: Medicaid Other

## 2020-07-27 NOTE — Progress Notes (Deleted)
New Outpatient Visit Date: 07/29/2020  Referring Provider: Theotis Burrow, NP [address] [place],  [place] [address]  Chief Complaint: ***  HPI:  Traci Cross is a 59 y.o. female who is being seen today for the evaluation of chest pain and palpitations at the request of Theotis Burrow, NP. She has a history of stroke, hypertension, COPD, sleep apnea, tobacco use, and depression. ***  --------------------------------------------------------------------------------------------------  Cardiovascular History & Procedures: Cardiovascular Problems:  Chest pain  Palpitations  Risk Factors:  Stroke, hypertension, obesity, and tobacco use  Cath/PCI:  None  CV Surgery:  None  EP Procedures and Devices:  None  Non-Invasive Evaluation(s):  TTE (04/21/2019): Normal LV size with mild LVH.  LVEF 55-60% with grade 1 diastolic dysfunction.  Normal RV size and function.  Aortic sclerosis without stenosis.  Carotid Doppler (04/20/2019): Mild carotid atherosclerosis without hemodynamically significant stenosis in either ICA.  Recent CV Pertinent Labs: Lab Results  Component Value Date   CHOL 151 04/20/2019   CHOL 181 07/26/2018   HDL 33 (L) 04/20/2019   HDL 48 07/26/2018   LDLCALC 65 04/20/2019   LDLCALC 117 (H) 07/26/2018   TRIG 265 (H) 04/20/2019   CHOLHDL 4.6 04/20/2019   INR 0.9 04/19/2019   BNP 17.0 03/25/2015   K 3.2 (L) 07/01/2019   MG 2.3 04/20/2019   BUN 11 07/01/2019   BUN 13 07/26/2018   CREATININE 0.74 07/01/2019    --------------------------------------------------------------------------------------------------  Past Medical History:  Diagnosis Date  . Asthma   . COPD (chronic obstructive pulmonary disease) (HCC)   . CVA (cerebral vascular accident) (HCC) 04/19/2019  . Depression   . Hypertension   . Sleep apnea   . Tobacco abuse     Past Surgical History:  Procedure Laterality Date  . CHOLECYSTECTOMY    . ERCP N/A 08/08/2019   Procedure:  ENDOSCOPIC RETROGRADE CHOLANGIOPANCREATOGRAPHY (ERCP);  Surgeon: Midge Minium, MD;  Location: [facility] ENDOSCOPY;  Service: Endoscopy;  Laterality: N/A;  . LAPAROSCOPIC APPENDECTOMY N/A 06/29/2019   Procedure: APPENDECTOMY LAPAROSCOPIC cecectomy;  Surgeon: Leafy Ro, MD;  Location: [facility] ORS;  Service: General;  Laterality: N/A;  . TONSILLECTOMY      No outpatient medications have been marked as taking for the 07/29/20 encounter (Appointment) with My Madariaga, Traci Deer, MD.    Allergies: Levaquin [levofloxacin], Codeine, Hydrocodone, and Penicillins  Social History   Tobacco Use  . Smoking status: Current Every Day Smoker    Packs/day: 0.25    Years: 35.00    Pack years: 8.75    Types: Cigarettes  . Smokeless tobacco: Never Used  Vaping Use  . Vaping Use: Never used  Substance Use Topics  . Alcohol use: No    Alcohol/week: 0.0 standard drinks  . Drug use: No    Family History  Problem Relation Age of Onset  . Heart failure Mother   . Stroke Mother   . Alzheimer's disease Mother   . Heart failure Father   . Stroke Father   . Dementia Father     Review of Systems: A 12-system review of systems was performed and was negative except as noted in the HPI.  --------------------------------------------------------------------------------------------------  Physical Exam: There were no vitals taken for this visit.  General:  *** HEENT: No conjunctival pallor or scleral icterus. Facemask in place. Neck: Supple without lymphadenopathy, thyromegaly, JVD, or HJR. No carotid bruit. Lungs: Normal work of breathing. Clear to auscultation bilaterally without wheezes or crackles. Heart: Regular rate and rhythm without murmurs, rubs, or gallops.  Non-displaced PMI. Abd: Bowel sounds present. Soft, NT/ND without hepatosplenomegaly Ext: No lower extremity edema. Radial, PT, and DP pulses are 2+ bilaterally Skin: Warm and dry without rash. Neuro: CNIII-XII intact. Strength and fine-touch  sensation intact in upper and lower extremities bilaterally. Psych: Normal mood and affect.  EKG:  ***  Lab Results  Component Value Date   WBC 5.8 07/01/2019   HGB 10.9 (L) 07/01/2019   HCT 32.7 (L) 07/01/2019   MCV 88.9 07/01/2019   PLT 155 07/01/2019    Lab Results  Component Value Date   NA 142 07/01/2019   K 3.2 (L) 07/01/2019   CL 110 07/01/2019   CO2 27 07/01/2019   BUN 11 07/01/2019   CREATININE 0.74 07/01/2019   GLUCOSE 102 (H) 07/01/2019   ALT 37 07/01/2019    Lab Results  Component Value Date   CHOL 151 04/20/2019   HDL 33 (L) 04/20/2019   LDLCALC 65 04/20/2019   TRIG 265 (H) 04/20/2019   CHOLHDL 4.6 04/20/2019     --------------------------------------------------------------------------------------------------  ASSESSMENT AND PLAN: Traci Deer Magdiel Bartles, MD 07/27/2020 8:08 AM

## 2020-07-29 ENCOUNTER — Ambulatory Visit: Payer: Medicaid Other | Admitting: Internal Medicine

## 2020-07-30 ENCOUNTER — Encounter: Payer: Self-pay | Admitting: Internal Medicine

## 2020-08-07 ENCOUNTER — Ambulatory Visit (INDEPENDENT_AMBULATORY_CARE_PROVIDER_SITE_OTHER): Payer: Medicaid Other

## 2020-08-07 ENCOUNTER — Other Ambulatory Visit: Payer: Self-pay

## 2020-08-07 ENCOUNTER — Encounter: Payer: Self-pay | Admitting: Cardiology

## 2020-08-07 ENCOUNTER — Ambulatory Visit (INDEPENDENT_AMBULATORY_CARE_PROVIDER_SITE_OTHER): Payer: Medicaid Other | Admitting: Cardiology

## 2020-08-07 ENCOUNTER — Telehealth: Payer: Self-pay | Admitting: Cardiology

## 2020-08-07 VITALS — BP 102/72 | HR 78 | Ht 67.5 in | Wt 193.0 lb

## 2020-08-07 DIAGNOSIS — E7800 Pure hypercholesterolemia, unspecified: Secondary | ICD-10-CM

## 2020-08-07 DIAGNOSIS — I498 Other specified cardiac arrhythmias: Secondary | ICD-10-CM

## 2020-08-07 DIAGNOSIS — R079 Chest pain, unspecified: Secondary | ICD-10-CM | POA: Diagnosis not present

## 2020-08-07 DIAGNOSIS — I10 Essential (primary) hypertension: Secondary | ICD-10-CM

## 2020-08-07 DIAGNOSIS — F17200 Nicotine dependence, unspecified, uncomplicated: Secondary | ICD-10-CM

## 2020-08-07 DIAGNOSIS — R072 Precordial pain: Secondary | ICD-10-CM

## 2020-08-07 MED ORDER — HYDROCHLOROTHIAZIDE 12.5 MG PO TABS: 12.5000 mg | ORAL_TABLET | Freq: Every day | ORAL | 1 refills | Status: DC

## 2020-08-07 MED ORDER — HYDROCHLOROTHIAZIDE 12.5 MG PO TABS: ORAL_TABLET | ORAL | 1 refills | Status: DC

## 2020-08-07 MED ORDER — METOPROLOL TARTRATE 100 MG PO TABS: ORAL_TABLET | ORAL | 0 refills | Status: DC

## 2020-08-07 NOTE — Telephone Encounter (Signed)
Please call to clarify Hydrochlorothiazide.

## 2020-08-07 NOTE — Progress Notes (Signed)
Cardiology Office Note:    Date:  08/07/2020   ID:  Traci Cross, DOB 19-May-1961, MRN [id number]  PCP:  Kendell Bane, NP  [facility] Cardiologist:  No primary care provider on file.  [facility] Electrophysiologist:  None   Referring MD: Luiz Ochoa, NP   Chief Complaint  Patient presents with  . New Patient (Initial Visit)    Referred by Dr. Lovena Le for CP  Pt states she had a stroke last year in July, has chest pains and fluttering on left side of chest--pain happens randomly, lasting 10 minutes to an hour; occasional light headedness--"not a whole lot" sometimes after taking HCTZ.     History of Present Illness:    Traci Cross is a 59 y.o. female with a hx of hypertension, hyperlipidemia, COPD on 2 L home oxygen nightly, CVA, current smoker x35+ years who presents due to chest pain and heart fluttering.  Patient states having symptoms of fast heart rates and fluttering heart rate for about a year now.  Last episode was a week ago.  Symptoms typically last for about 5 minutes to 30 minutes sometimes.  She endorses dizziness which she attributes to taking HCTZ.  She does not check her blood pressure frequently at home.  States having chest pain sometimes not related with exertion.  Endorses having chronic shortness of breath on exertion which she attributes to COPD.  She still smokes.  Patient had a stroke in July 2020, right posterior MCA territory infarct, echocardiogram at the time showed normal systolic function, EF 55 to 60%, impaired relaxation.  Past Medical History:  Diagnosis Date  . Asthma   . COPD (chronic obstructive pulmonary disease) ([place])   . CVA (cerebral vascular accident) ([place]) 04/19/2019  . Depression   . Hypertension   . Sleep apnea   . Tobacco abuse     Past Surgical History:  Procedure Laterality Date  . CHOLECYSTECTOMY    . ERCP N/A 08/08/2019   Procedure: ENDOSCOPIC RETROGRADE CHOLANGIOPANCREATOGRAPHY (ERCP);  Surgeon: Lucilla Lame,  MD;  Location: [facility] ENDOSCOPY;  Service: Endoscopy;  Laterality: N/A;  . LAPAROSCOPIC APPENDECTOMY N/A 06/29/2019   Procedure: APPENDECTOMY LAPAROSCOPIC cecectomy;  Surgeon: Jules Husbands, MD;  Location: [facility] ORS;  Service: General;  Laterality: N/A;  . TONSILLECTOMY      Current Medications: Current Meds  Medication Sig  . albuterol (VENTOLIN HFA) 108 (90 Base) MCG/ACT inhaler INHALE 2 PUFFS INTO THE LUNGS EVERY 6 HOURS AS NEEDED FOR WHEEZING OR SHORTNESS OF BREATH  . aspirin EC 81 MG tablet Take 81 mg by mouth daily. Swallow whole.  Marland Kitchen atorvastatin (LIPITOR) 40 MG tablet Take 1 tablet (40 mg total) by mouth daily.  . budesonide-formoterol (SYMBICORT) 80-4.5 MCG/ACT inhaler Inhale 2 puffs into the lungs 2 (two) times daily. For copd  . celecoxib (CELEBREX) 200 MG capsule Take 1 capsule (200 mg total) by mouth 2 (two) times daily.  . hydrochlorothiazide (HYDRODIURIL) 12.5 MG tablet TAKE 1 TABLET(25 MG) BY MOUTH DAILY  . OXYGEN Inhale 2 L into the lungs at bedtime.  . [DISCONTINUED] hydrochlorothiazide (HYDRODIURIL) 25 MG tablet TAKE 1 TABLET(25 MG) BY MOUTH DAILY     Allergies:   Levaquin [levofloxacin], Codeine, Hydrocodone, and Penicillins   Social History   Socioeconomic History  . Marital status: Single    Spouse name: Not on file  . Number of children: Not on file  . Years of education: Not on file  . Highest education level: Not on file  Occupational History  . Not on file  Tobacco Use  . Smoking status: Current Every Day Smoker    Packs/day: 0.25    Years: 35.00    Pack years: 8.75    Types: Cigarettes  . Smokeless tobacco: Never Used  Vaping Use  . Vaping Use: Never used  Substance and Sexual Activity  . Alcohol use: No    Alcohol/week: 0.0 standard drinks  . Drug use: No  . Sexual activity: Not on file  Other Topics Concern  . Not on file  Social History Narrative   Lives at home alone   Right handed   Caffeine: up to a 2L/day of soda    Social Determinants  of Health   Financial Resource Strain:   . Difficulty of Paying Living Expenses: Not on file  Food Insecurity:   . Worried About Charity fundraiser in the Last Year: Not on file  . Ran Out of Food in the Last Year: Not on file  Transportation Needs:   . Lack of Transportation (Medical): Not on file  . Lack of Transportation (Non-Medical): Not on file  Physical Activity:   . Days of Exercise per Week: Not on file  . Minutes of Exercise per Session: Not on file  Stress:   . Feeling of Stress : Not on file  Social Connections:   . Frequency of Communication with Friends and Family: Not on file  . Frequency of Social Gatherings with Friends and Family: Not on file  . Attends Religious Services: Not on file  . Active Member of Clubs or Organizations: Not on file  . Attends Archivist Meetings: Not on file  . Marital Status: Not on file     Family History: The patient's family history includes Alzheimer's disease in her mother; Dementia in her father; Heart failure in her father and mother; Stroke in her father and mother.  ROS:   Please see the history of present illness.     All other systems reviewed and are negative.  EKGs/Labs/Other Studies Reviewed:    The following studies were reviewed today:   EKG:  EKG is  ordered today.  The ekg ordered today demonstrates normal sinus rhythm, PACs, otherwise normal ECG  Recent Labs: No results found for requested labs within last 8760 hours.  Recent Lipid Panel    Component Value Date/Time   CHOL 151 04/20/2019 0530   CHOL 181 07/26/2018 0833   TRIG 265 (H) 04/20/2019 0530   HDL 33 (L) 04/20/2019 0530   HDL 48 07/26/2018 0833   CHOLHDL 4.6 04/20/2019 0530   VLDL 53 (H) 04/20/2019 0530   LDLCALC 65 04/20/2019 0530   LDLCALC 117 (H) 07/26/2018 0833     Risk Assessment/Calculations:      Physical Exam:    VS:  BP 102/72   Pulse 78   Ht 5' 7.5" (1.715 m)   Wt 193 lb (87.5 kg)   BMI 29.78 kg/m     Wt  Readings from Last 3 Encounters:  08/07/20 193 lb (87.5 kg)  07/14/20 198 lb 3.2 oz (89.9 kg)  06/03/20 203 lb 12.8 oz (92.4 kg)     GEN:  Well nourished, well developed in no acute distress HEENT: Normal NECK: No JVD; No carotid bruits LYMPHATICS: No lymphadenopathy CARDIAC: RRR, no murmurs, rubs, gallops RESPIRATORY:  Clear to auscultation without rales, wheezing or rhonchi  ABDOMEN: Soft, non-tender, non-distended MUSCULOSKELETAL:  No edema; No deformity  SKIN: Warm and dry NEUROLOGIC:  Alert and oriented x 3 PSYCHIATRIC:  Normal affect   ASSESSMENT:    1. Chest pain of uncertain etiology   2. Primary hypertension   3. Pure hypercholesterolemia   4. Fluttering heart   5. Smoking   6. Precordial pain   7. Essential hypertension    PLAN:    In order of problems listed above:  1. Patient with atypical chest pain.  Has risk factors of hypertension, hyperlipidemia, current smoker.  Previous echocardiogram 04/2019 with normal systolic function, EF 00-76, impaired relaxation.  Get coronary CTA to evaluate presence of CAD.  Continue aspirin and statin as prescribed.  Has a history of CVA. 2. History of hypertension, patient feels dizzy on current dose of HCTZ.  Blood pressures low normal today.  Decrease HCTZ to 12.5 mg daily. 3. History of hyperlipidemia, continue Lipitor 40 mg daily. 4. History of heart flutters , place cardiac monitor x2 weeks to evaluate any significant arrhythmias such as A. fib or flutter, especially in this patient with history of CVA. 5. Patient is a current smoker, smoking cessation advised.  Follow-up after work cardiac monitor and coronary CTA.   Medication Adjustments/Labs and Tests Ordered: Current medicines are reviewed at length with the patient today.  Concerns regarding medicines are outlined above.  Orders Placed This Encounter  Procedures  . CT CORONARY MORPH W/CTA COR W/SCORE W/CA W/CM &/OR WO/CM  . CT CORONARY FRACTIONAL FLOW RESERVE DATA  PREP  . CT CORONARY FRACTIONAL FLOW RESERVE FLUID ANALYSIS  . Basic metabolic panel  . LONG TERM MONITOR (3-14 DAYS)  . EKG 12-Lead   Meds ordered this encounter  Medications  . metoprolol tartrate (LOPRESSOR) 100 MG tablet    Sig: Take 1 tablet (100 mg ) by mouth 2 hours prior to Cardiac CT    Dispense:  1 tablet    Refill:  0  . hydrochlorothiazide (HYDRODIURIL) 12.5 MG tablet    Sig: TAKE 1 TABLET(25 MG) BY MOUTH DAILY    Dispense:  90 tablet    Refill:  1    REDUCE dosage    Patient Instructions  Medication Instructions:  Your physician has recommended you make the following change in your medication:   REDUCE HCTZ to 12.5 mg daily. An Rx has been sent to your pharmacy.  A one time dose of Metoprolol 100 mg to be taken 2 hours prior to your Cardiac CT has been sent to your pharmacy.  *If you need a refill on your cardiac medications before your next appointment, please call your pharmacy*   Lab Work: Your physician recommends that you return for lab work 2-3 days prior to your Cardiac CT.  Please have your lab drawn at the [facility] medical mall. You do not need an appt. Lab hours are Mon-Fri 7am-6pm.  If you have labs (blood work) drawn today and your tests are completely normal, you will receive your results only by: Marland Kitchen MyChart Message (if you have MyChart) OR . A paper copy in the mail If you have any lab test that is abnormal or we need to change your treatment, we will call you to review the results.   Testing/Procedures: Your physician has requested that you have cardiac CT. Cardiac computed tomography (CT) is a painless test that uses an x-ray machine to take clear, detailed pictures of your heart. For further information please visit HugeFiesta.tn. Please follow instruction sheet as given.   Your physician has recommended that you wear a Zio monitor. This monitor is a  medical device that records the heart's electrical activity. Doctors most often use these  monitors to diagnose arrhythmias. Arrhythmias are problems with the speed or rhythm of the heartbeat. The monitor is a small device applied to your chest. You can wear one while you do your normal daily activities. While wearing this monitor if you have any symptoms to push the button and record what you felt. Once you have worn this monitor for the period of time provider prescribed (Usually 14 days), you will return the monitor device in the postage paid box. Once it is returned they will download the data collected and provide [place] with a report which the provider will then review and we will call you with those results. Important tips:  1. Avoid showering during the first 24 hours of wearing the monitor. 2. Avoid excessive sweating to help maximize wear time. 3. Do not submerge the device, no hot tubs, and no swimming pools. 4. Keep any lotions or oils away from the patch. 5. After 24 hours you may shower with the patch on. Take brief showers with your back facing the shower head.  6. Do not remove patch once it has been placed because that will interrupt data and decrease adhesive wear time. 7. Push the button when you have any symptoms and write down what you were feeling. 8. Once you have completed wearing your monitor, remove and place into box which has postage paid and place in your outgoing mailbox.  9. If for some reason you have misplaced your box then call our office and we can provide another box and/or mail it off for you.         Follow-Up: At [facility], you and your health needs are our priority.  As part of our continuing mission to provide you with exceptional heart care, we have created designated Provider Care Teams.  These Care Teams include your primary Cardiologist (physician) and Advanced Practice Providers (APPs -  Physician Assistants and Nurse Practitioners) who all work together to provide you with the care you need, when you need it.  We recommend signing up  for the patient portal called "MyChart".  Sign up information is provided on this After Visit Summary.  MyChart is used to connect with patients for Virtual Visits (Telemedicine).  Patients are able to view lab/test results, encounter notes, upcoming appointments, etc.  Non-urgent messages can be sent to your provider as well.   To learn more about what you can do with MyChart, go to [email or web address].    Your next appointment:   4-6 week(s)  The format for your next appointment:   In Person  Provider:   Kate Sable, MD   Other Instructions Your cardiac CT will be scheduled at one of the below locations:   [facility] [address] [place], [place] [address] [phone]  [place] [address] [place], [place] [address] [phone]  If scheduled at [facility], please arrive at the [facility] main entrance of [facility] 30 minutes prior to test start time. Proceed to the [facility] Radiology Department (first floor) to check-in and test prep.  If scheduled at [facility], please arrive 15 mins early for check-in and test prep.  Please follow these instructions carefully (unless otherwise directed):   On the Night Before the Test: . Be sure to Drink plenty of water. . Do not consume any caffeinated/decaffeinated beverages or chocolate  12 hours prior to your test. . Do not take any antihistamines 12 hours prior to your test.   On the Day of the Test: . Drink plenty of water. Do not drink any water within one hour of the test. . Do not eat any food 4 hours prior to the test. . You may take your regular medications prior to the test.  . Take metoprolol (Lopressor) two hours prior to test. . HOLD Hydrochlorothiazide morning of the test. . FEMALES- please wear underwire-free bra if available       After the Test: . Drink plenty of water. . After  receiving IV contrast, you may experience a mild flushed feeling. This is normal. . On occasion, you may experience a mild rash up to 24 hours after the test. This is not dangerous. If this occurs, you can take Benadryl 25 mg and increase your fluid intake. . If you experience trouble breathing, this can be serious. If it is severe call 911 IMMEDIATELY. If it is mild, please call our office. . If you take any of these medications: Glipizide/Metformin, Avandament, Glucavance, please do not take 48 hours after completing test unless otherwise instructed.   Once we have confirmed authorization from your insurance company, we will call you to set up a date and time for your test. Based on how quickly your insurance processes prior authorizations requests, please allow up to 4 weeks to be contacted for scheduling your Cardiac CT appointment. Be advised that routine Cardiac CT appointments could be scheduled as many as 8 weeks after your provider has ordered it.  For non-scheduling related questions, please contact the cardiac imaging nurse navigator should you have any questions/concerns: Marchia Bond, Cardiac Imaging Nurse Navigator Burley Saver, Interim Cardiac Imaging Nurse [place] and Vascular Services Direct Office Dial: [phone]   For scheduling needs, including cancellations and rescheduling, please call Vivien Rota at [phone], option 3.        Signed, Kate Sable, MD  08/07/2020 12:53 PM    [place]

## 2020-08-07 NOTE — Patient Instructions (Addendum)
Medication Instructions:  Your physician has recommended you make the following change in your medication:   REDUCE HCTZ to 12.5 mg daily. An Rx has been sent to your pharmacy.  A one time dose of Metoprolol 100 mg to be taken 2 hours prior to your Cardiac CT has been sent to your pharmacy.  *If you need a refill on your cardiac medications before your next appointment, please call your pharmacy*   Lab Work: Your physician recommends that you return for lab work 2-3 days prior to your Cardiac CT.  Please have your lab drawn at the [facility] medical mall. You do not need an appt. Lab hours are Mon-Fri 7am-6pm.  If you have labs (blood work) drawn today and your tests are completely normal, you will receive your results only by: Marland Kitchen MyChart Message (if you have MyChart) OR . A paper copy in the mail If you have any lab test that is abnormal or we need to change your treatment, we will call you to review the results.   Testing/Procedures: Your physician has requested that you have cardiac CT. Cardiac computed tomography (CT) is a painless test that uses an x-ray machine to take clear, detailed pictures of your heart. For further information please visit HugeFiesta.tn. Please follow instruction sheet as given.   Your physician has recommended that you wear a Zio monitor. This monitor is a medical device that records the heart's electrical activity. Doctors most often use these monitors to diagnose arrhythmias. Arrhythmias are problems with the speed or rhythm of the heartbeat. The monitor is a small device applied to your chest. You can wear one while you do your normal daily activities. While wearing this monitor if you have any symptoms to push the button and record what you felt. Once you have worn this monitor for the period of time provider prescribed (Usually 14 days), you will return the monitor device in the postage paid box. Once it is returned they will download the data collected and  provide [place] with a report which the provider will then review and we will call you with those results. Important tips:  1. Avoid showering during the first 24 hours of wearing the monitor. 2. Avoid excessive sweating to help maximize wear time. 3. Do not submerge the device, no hot tubs, and no swimming pools. 4. Keep any lotions or oils away from the patch. 5. After 24 hours you may shower with the patch on. Take brief showers with your back facing the shower head.  6. Do not remove patch once it has been placed because that will interrupt data and decrease adhesive wear time. 7. Push the button when you have any symptoms and write down what you were feeling. 8. Once you have completed wearing your monitor, remove and place into box which has postage paid and place in your outgoing mailbox.  9. If for some reason you have misplaced your box then call our office and we can provide another box and/or mail it off for you.         Follow-Up: At [facility], you and your health needs are our priority.  As part of our continuing mission to provide you with exceptional heart care, we have created designated Provider Care Teams.  These Care Teams include your primary Cardiologist (physician) and Advanced Practice Providers (APPs -  Physician Assistants and Nurse Practitioners) who all work together to provide you with the care you need, when you need it.  We recommend signing  up for the patient portal called "MyChart".  Sign up information is provided on this After Visit Summary.  MyChart is used to connect with patients for Virtual Visits (Telemedicine).  Patients are able to view lab/test results, encounter notes, upcoming appointments, etc.  Non-urgent messages can be sent to your provider as well.   To learn more about what you can do with MyChart, go to [email or web address].    Your next appointment:   4-6 week(s)  The format for your next appointment:   In Person  Provider:     Kate Sable, MD   Other Instructions Your cardiac CT will be scheduled at one of the below locations:   [facility] [address] [place], [place] [address] [phone]  [place] [address] [place], [place] [address] [phone]  If scheduled at [facility], please arrive at the [facility] main entrance of [facility] 30 minutes prior to test start time. Proceed to the [facility] Radiology Department (first floor) to check-in and test prep.  If scheduled at [facility], please arrive 15 mins early for check-in and test prep.  Please follow these instructions carefully (unless otherwise directed):   On the Night Before the Test: . Be sure to Drink plenty of water. . Do not consume any caffeinated/decaffeinated beverages or chocolate 12 hours prior to your test. . Do not take any antihistamines 12 hours prior to your test.   On the Day of the Test: . Drink plenty of water. Do not drink any water within one hour of the test. . Do not eat any food 4 hours prior to the test. . You may take your regular medications prior to the test.  . Take metoprolol (Lopressor) two hours prior to test. . HOLD Hydrochlorothiazide morning of the test. . FEMALES- please wear underwire-free bra if available       After the Test: . Drink plenty of water. . After receiving IV contrast, you may experience a mild flushed feeling. This is normal. . On occasion, you may experience a mild rash up to 24 hours after the test. This is not dangerous. If this occurs, you can take Benadryl 25 mg and increase your fluid intake. . If you experience trouble breathing, this can be serious. If it is severe call 911 IMMEDIATELY. If it is mild, please call our office. . If you take any of these medications: Glipizide/Metformin, Avandament, Glucavance, please do not take 48 hours after  completing test unless otherwise instructed.   Once we have confirmed authorization from your insurance company, we will call you to set up a date and time for your test. Based on how quickly your insurance processes prior authorizations requests, please allow up to 4 weeks to be contacted for scheduling your Cardiac CT appointment. Be advised that routine Cardiac CT appointments could be scheduled as many as 8 weeks after your provider has ordered it.  For non-scheduling related questions, please contact the cardiac imaging nurse navigator should you have any questions/concerns: Marchia Bond, Cardiac Imaging Nurse Navigator Burley Saver, Interim Cardiac Imaging Nurse [place] and Vascular Services Direct Office Dial: [phone]   For scheduling needs, including cancellations and rescheduling, please call Vivien Rota at [phone], option 3.

## 2020-08-07 NOTE — Telephone Encounter (Signed)
Returned the call to [facility] pharmacy.  Detailed message left of their secured voicemail.  Patient was seen today by Dr. Azucena Cecil and HCTZ was reduced to 12.5mg  daily.  The pharmacy is to contact our office if any additional questions.

## 2020-08-13 ENCOUNTER — Ambulatory Visit: Payer: Medicaid Other | Admitting: Hospice and Palliative Medicine

## 2020-08-13 ENCOUNTER — Ambulatory Visit: Payer: Medicaid Other | Admitting: Internal Medicine

## 2020-09-01 ENCOUNTER — Other Ambulatory Visit: Payer: Self-pay

## 2020-09-01 DIAGNOSIS — J449 Chronic obstructive pulmonary disease, unspecified: Secondary | ICD-10-CM

## 2020-09-01 MED ORDER — ALBUTEROL SULFATE HFA 108 (90 BASE) MCG/ACT IN AERS: INHALATION_SPRAY | RESPIRATORY_TRACT | 1 refills | Status: DC

## 2020-09-02 ENCOUNTER — Telehealth (HOSPITAL_COMMUNITY): Payer: Self-pay | Admitting: *Deleted

## 2020-09-02 ENCOUNTER — Other Ambulatory Visit
Admission: RE | Admit: 2020-09-02 | Discharge: 2020-09-02 | Disposition: A | Discharge status: Home / Self Care | Payer: Medicaid Other | Attending: Cardiology | Admitting: Cardiology

## 2020-09-02 ENCOUNTER — Ambulatory Visit: Payer: Medicaid Other | Admitting: Hospice and Palliative Medicine

## 2020-09-02 DIAGNOSIS — I10 Essential (primary) hypertension: Secondary | ICD-10-CM | POA: Diagnosis present

## 2020-09-02 DIAGNOSIS — R079 Chest pain, unspecified: Secondary | ICD-10-CM | POA: Insufficient documentation

## 2020-09-02 LAB — BASIC METABOLIC PANEL
Anion gap: 9 (ref 5–15)
BUN: 18 mg/dL (ref 6–20)
CO2: 28 mmol/L (ref 22–32)
Calcium: 9 mg/dL (ref 8.9–10.3)
Chloride: 102 mmol/L (ref 98–111)
Creatinine, Ser: 1.28 mg/dL — ABNORMAL HIGH (ref 0.44–1.00)
GFR, Estimated: 49 mL/min — ABNORMAL LOW (ref >60)
Glucose, Bld: 113 mg/dL — ABNORMAL HIGH (ref 70–99)
Potassium: 3.9 mmol/L (ref 3.5–5.1)
Sodium: 139 mmol/L (ref 135–145)

## 2020-09-02 NOTE — Telephone Encounter (Signed)
Reaching out to patient to offer assistance regarding upcoming cardiac imaging study; pt verbalizes understanding of appt date/time, parking situation and where to check in, pre-test NPO status and medications ordered, and verified current allergies; name and call back number provided for further questions should they arise ° °Edrik Rundle Tai RN Navigator Cardiac Imaging ° Heart and Vascular °[phone] office °[phone] cell ° °

## 2020-09-03 ENCOUNTER — Ambulatory Visit: Admission: RE | Admit: 2020-09-03 | Payer: Medicaid Other | Source: Ambulatory Visit

## 2020-09-03 ENCOUNTER — Telehealth: Payer: Self-pay | Admitting: Licensed Clinical Social Worker

## 2020-09-03 ENCOUNTER — Telehealth: Payer: Self-pay

## 2020-09-03 NOTE — Telephone Encounter (Signed)
Spoke with patient and relayed the below copied and pasted result note from Dr. Azucena Cecil:  Debbe Odea, MD  Picard-Tagnolli, Valoria Tamburri, RN Creatinine slightly abnormal. These have patient stop HCTZ for now. Recheck BMP in 5 days. Keep home BP log.   Patient stated that she did not believe she had a ride available to have her lab drawn. She has a Coronary CTA scheduled for 09/09/20 in 6 days and I messaged Stephanie Bogg per Dr. Azucena Cecil to see if they can do a stat draw prior to her CTA. Will wait for her reply.   Also patient stated she cannot afford a BP monitor. I messaged Marcy Siren, LCSW for patient assistance on this matter.

## 2020-09-03 NOTE — Telephone Encounter (Signed)
CSW referred to assist patient with obtaining a BP cuff. CSW contacted patient to inform cuff will be delivered to home. Patient grateful for support and assistance. CSW available as needed. Jackie Christionna Poland, LCSW, CCSW-MCS [phone]  

## 2020-09-04 ENCOUNTER — Telehealth: Payer: Self-pay

## 2020-09-04 NOTE — Telephone Encounter (Signed)
Spoke with patient and informed her of the new arrival time for her CTA per copied and pasted note below:  Mogg, Conception Chancy, RT  Picard-Tagnolli, Alicianna Litchford, RN So yes can we have her come at 9:00am and please encourage her to drink lots of water and fluids to hydrate really well before Wednesday.   Patient verbalized understanding and agreed with plan.

## 2020-09-07 ENCOUNTER — Telehealth (HOSPITAL_COMMUNITY): Payer: Self-pay | Admitting: Emergency Medicine

## 2020-09-07 NOTE — Telephone Encounter (Signed)
Reaching out to patient to offer assistance regarding upcoming cardiac imaging study; pt verbalizes understanding of appt date/time, parking situation and where to check in, pre-test NPO status and medications ordered, and verified current allergies; name and call back number provided for further questions should they arise Teairra Millar RN Navigator Cardiac Imaging [facility] [phone] office [phone] cell 

## 2020-09-09 ENCOUNTER — Other Ambulatory Visit: Payer: Self-pay

## 2020-09-09 ENCOUNTER — Ambulatory Visit
Admission: RE | Admit: 2020-09-09 | Discharge: 2020-09-09 | Disposition: A | Discharge status: Home / Self Care | Payer: Medicaid Other | Source: Ambulatory Visit | Attending: Cardiology | Admitting: Cardiology

## 2020-09-09 DIAGNOSIS — J449 Chronic obstructive pulmonary disease, unspecified: Secondary | ICD-10-CM

## 2020-09-09 DIAGNOSIS — R072 Precordial pain: Secondary | ICD-10-CM

## 2020-09-09 LAB — POCT I-STAT CREATININE: Creatinine, Ser: 0.7 mg/dL (ref 0.44–1.00)

## 2020-09-09 MED ORDER — IOHEXOL 350 MG/ML SOLN
75.0000 mL | Freq: Once | INTRAVENOUS | Status: AC | PRN
  Administered 2020-09-09: 75 mL via INTRAVENOUS

## 2020-09-09 MED ORDER — NITROGLYCERIN 0.4 MG SL SUBL
0.8000 mg | SUBLINGUAL_TABLET | Freq: Once | SUBLINGUAL | Status: AC
  Administered 2020-09-09: 0.8 mg via SUBLINGUAL

## 2020-09-09 MED ORDER — ALBUTEROL SULFATE HFA 108 (90 BASE) MCG/ACT IN AERS: INHALATION_SPRAY | RESPIRATORY_TRACT | 1 refills | Status: DC

## 2020-09-09 MED ORDER — METOPROLOL TARTRATE 5 MG/5ML IV SOLN: 10.0000 mg | Freq: Once | INTRAVENOUS | Status: DC

## 2020-09-09 NOTE — Progress Notes (Signed)
Patient tolerated CT well. Drinking water sitting in chair. Vital signs stable encourage to drink water throughout day.Reasons explained and verbalized understanding. Ambulated steady gait.   

## 2020-09-14 ENCOUNTER — Telehealth: Payer: Self-pay

## 2020-09-14 NOTE — Telephone Encounter (Signed)
PA was approved for VENTOLIN inhaler on 09/09/2020.  PA reference # [id number].

## 2020-09-15 ENCOUNTER — Telehealth: Payer: Self-pay

## 2020-09-22 ENCOUNTER — Telehealth: Payer: Self-pay | Admitting: Cardiology

## 2020-09-22 NOTE — Telephone Encounter (Signed)
°  Patient Consent for Virtual Visit         Traci Cross has provided verbal consent on 09/22/2020 for a virtual visit (video or telephone).   CONSENT FOR VIRTUAL VISIT FOR:  Traci Cross  By participating in this virtual visit I agree to the following:  I hereby voluntarily request, consent and authorize [facility] and its employed or contracted physicians, physician assistants, nurse practitioners or other licensed health care professionals (the Practitioner), to provide me with telemedicine health care services (the Services") as deemed necessary by the treating Practitioner. I acknowledge and consent to receive the Services by the Practitioner via telemedicine. I understand that the telemedicine visit will involve communicating with the Practitioner through live audiovisual communication technology and the disclosure of certain medical information by electronic transmission. I acknowledge that I have been given the opportunity to request an in-person assessment or other available alternative prior to the telemedicine visit and am voluntarily participating in the telemedicine visit.  I understand that I have the right to withhold or withdraw my consent to the use of telemedicine in the course of my care at any time, without affecting my right to future care or treatment, and that the Practitioner or I may terminate the telemedicine visit at any time. I understand that I have the right to inspect all information obtained and/or recorded in the course of the telemedicine visit and may receive copies of available information for a reasonable fee.  I understand that some of the potential risks of receiving the Services via telemedicine include:   Delay or interruption in medical evaluation due to technological equipment failure or disruption;  Information transmitted may not be sufficient (e.g. poor resolution of images) to allow for appropriate medical decision making by the Practitioner;  and/or   In rare instances, security protocols could fail, causing a breach of personal health information.  Furthermore, I acknowledge that it is my responsibility to provide information about my medical history, conditions and care that is complete and accurate to the best of my ability. I acknowledge that Practitioner's advice, recommendations, and/or decision may be based on factors not within their control, such as incomplete or inaccurate data provided by me or distortions of diagnostic images or specimens that may result from electronic transmissions. I understand that the practice of medicine is not an exact science and that Practitioner makes no warranties or guarantees regarding treatment outcomes. I acknowledge that a copy of this consent can be made available to me via my patient portal [facility] MyChart), or I can request a printed copy by calling the office of [facility].    I understand that my insurance will be billed for this visit.   I have read or had this consent read to me.  I understand the contents of this consent, which adequately explains the benefits and risks of the Services being provided via telemedicine.   I have been provided ample opportunity to ask questions regarding this consent and the Services and have had my questions answered to my satisfaction.  I give my informed consent for the services to be provided through the use of telemedicine in my medical care

## 2020-09-25 ENCOUNTER — Telehealth (INDEPENDENT_AMBULATORY_CARE_PROVIDER_SITE_OTHER): Payer: Medicaid Other | Admitting: Cardiology

## 2020-09-25 ENCOUNTER — Encounter: Payer: Self-pay | Admitting: Cardiology

## 2020-09-25 VITALS — Ht 67.5 in

## 2020-09-25 DIAGNOSIS — I498 Other specified cardiac arrhythmias: Secondary | ICD-10-CM

## 2020-09-25 DIAGNOSIS — R079 Chest pain, unspecified: Secondary | ICD-10-CM | POA: Diagnosis not present

## 2020-09-25 DIAGNOSIS — F17200 Nicotine dependence, unspecified, uncomplicated: Secondary | ICD-10-CM

## 2020-09-25 DIAGNOSIS — I10 Essential (primary) hypertension: Secondary | ICD-10-CM | POA: Diagnosis not present

## 2020-09-25 DIAGNOSIS — E7800 Pure hypercholesterolemia, unspecified: Secondary | ICD-10-CM

## 2020-09-25 NOTE — Progress Notes (Signed)
Virtual Visit via Telephone Note   This visit type was conducted due to national recommendations for restrictions regarding the COVID-19 Pandemic (e.g. social distancing) in an effort to limit this patient's exposure and mitigate transmission in our community.  Due to her co-morbid illnesses, this patient is at least at moderate risk for complications without adequate follow up.  This format is felt to be most appropriate for this patient at this time.  The patient did not have access to video technology/had technical difficulties with video requiring transitioning to audio format only (telephone).  All issues noted in this document were discussed and addressed.  No physical exam could be performed with this format.  Please refer to the patient's chart for her  consent to telehealth for [facility].   Date:  09/25/2020   ID:  Traci Cross, DOB 03/29/1961, MRN [id number]  Patient Location: Home Provider Location: Office/Clinic  PCP:  Johnna Acosta, NP (Inactive)  Cardiologist:  Debbe Odea, MD  Electrophysiologist:  None   Evaluation Performed:  Follow-Up Visit  Chief Complaint: Follow-up for cardiac testing  History of Present Illness:    Traci Cross is a 59 y.o. female with history of hypertension, hyperlipidemia, COPD on 2 L home oxygen, CVA, current smoker x35+ years who presents for follow-up.  She was previously seen due to chest pain and fluttering.  She has chronic shortness of breath from COPD.  She still smokes.  Due to symptoms of chest discomfort and risk factors, coronary CTA was ordered to evaluate presence of CAD.  Cardiac monitor also placed to evaluate presence of arrhythmias due to history of heart flutters.  She states doing okay, has no concerns at this time.  Prior notes Had a stroke in July 2020, right posterior MCA territory infarct. Echo July 2020 showed preserved ejection fraction, EF 55 to 60%, impaired relaxation.  The patient does not have  symptoms concerning for COVID-19 infection (fever, chills, cough, or new shortness of breath).    Past Medical History:  Diagnosis Date  . Asthma   . COPD (chronic obstructive pulmonary disease) (HCC)   . CVA (cerebral vascular accident) (HCC) 04/19/2019  . Depression   . Hypertension   . Sleep apnea   . Tobacco abuse    Past Surgical History:  Procedure Laterality Date  . CHOLECYSTECTOMY    . ERCP N/A 08/08/2019   Procedure: ENDOSCOPIC RETROGRADE CHOLANGIOPANCREATOGRAPHY (ERCP);  Surgeon: Midge Minium, MD;  Location: [facility] ENDOSCOPY;  Service: Endoscopy;  Laterality: N/A;  . LAPAROSCOPIC APPENDECTOMY N/A 06/29/2019   Procedure: APPENDECTOMY LAPAROSCOPIC cecectomy;  Surgeon: Leafy Ro, MD;  Location: [facility] ORS;  Service: General;  Laterality: N/A;  . TONSILLECTOMY       Current Meds  Medication Sig  . albuterol (VENTOLIN HFA) 108 (90 Base) MCG/ACT inhaler INHALE 2 PUFFS INTO THE LUNGS EVERY 6 HOURS AS NEEDED FOR WHEEZING OR SHORTNESS OF BREATH  . aspirin EC 81 MG tablet Take 81 mg by mouth daily. Swallow whole.  Marland Kitchen atorvastatin (LIPITOR) 40 MG tablet Take 1 tablet (40 mg total) by mouth daily.  . budesonide-formoterol (SYMBICORT) 80-4.5 MCG/ACT inhaler Inhale 2 puffs into the lungs 2 (two) times daily. For copd  . celecoxib (CELEBREX) 200 MG capsule Take 1 capsule (200 mg total) by mouth 2 (two) times daily.  . hydrochlorothiazide (HYDRODIURIL) 12.5 MG tablet Take 1 tablet (12.5 mg total) by mouth daily.  . metoprolol tartrate (LOPRESSOR) 100 MG tablet Take 1 tablet (100 mg ) by mouth  2 hours prior to Cardiac CT  . nicotine (NICODERM CQ) 21 mg/24hr patch Place 1 patch (21 mg total) onto the skin daily.  . OXYGEN Inhale 2 L into the lungs at bedtime.  Marland Kitchen zolpidem (AMBIEN) 10 MG tablet Take by mouth.     Allergies:   Levaquin [levofloxacin], Codeine, Hydrocodone, and Penicillins   Social History   Tobacco Use  . Smoking status: Current Every Day Smoker    Packs/day: 0.25     Years: 35.00    Pack years: 8.75    Types: Cigarettes  . Smokeless tobacco: Never Used  Vaping Use  . Vaping Use: Never used  Substance Use Topics  . Alcohol use: No    Alcohol/week: 0.0 standard drinks  . Drug use: No     Family Hx: The patient's family history includes Alzheimer's disease in her mother; Dementia in her father; Heart failure in her father and mother; Stroke in her father and mother.  ROS:   Please see the history of present illness.     All other systems reviewed and are negative.   Prior CV studies:   The following studies were reviewed today:    Labs/Other Tests and Data Reviewed:    EKG:  No ECG reviewed.  Recent Labs: 09/02/2020: BUN 18; Potassium 3.9; Sodium 139 09/09/2020: Creatinine, Ser 0.70   Recent Lipid Panel Lab Results  Component Value Date/Time   CHOL 151 04/20/2019 05:30 AM   CHOL 181 07/26/2018 08:33 AM   TRIG 265 (H) 04/20/2019 05:30 AM   HDL 33 (L) 04/20/2019 05:30 AM   HDL 48 07/26/2018 08:33 AM   CHOLHDL 4.6 04/20/2019 05:30 AM   LDLCALC 65 04/20/2019 05:30 AM   LDLCALC 117 (H) 07/26/2018 08:33 AM    Wt Readings from Last 3 Encounters:  08/07/20 193 lb (87.5 kg)  07/14/20 198 lb 3.2 oz (89.9 kg)  06/03/20 203 lb 12.8 oz (92.4 kg)     Objective:    Vital Signs:  Ht 5' 7.5" (1.715 m)   BMI 29.78 kg/m    VITAL SIGNS:  reviewed  ASSESSMENT & PLAN:    1. Chest pain, risk factors hypertension hyperlipidemia current smoker.  Echo with preserved EF, no wall motion abnormalities.  Coronary CTA 11/24 showed calcium score 66, mild disease in the mid left circumflex, minimal stenosis in mid LAD.  Patient made aware of findings, reassured.  Continue aspirin and statin as currently prescribed. 2. History of fluttering, cardiac monitor x2 weeks with no evidence for atrial fibrillation or atrial flutter. 3. Hypertension, declined checking BP today.  Continue PTA medications for blood pressure. 4. Hyperlipidemia, continue  statin. 5. Current smoker, cessation advised.  COPD management as per primary/form.  Follow-up in 1 year.  COVID-19 Education: The signs and symptoms of COVID-19 were discussed with the patient and how to seek care for testing (follow up with PCP or arrange E-visit).  The importance of social distancing was discussed today.  Time:   Today, I have spent 35 minutes with the patient with telehealth technology discussing the above problems.     Medication Adjustments/Labs and Tests Ordered: Current medicines are reviewed at length with the patient today.  Concerns regarding medicines are outlined above.   Tests Ordered: No orders of the defined types were placed in this encounter.   Medication Changes: No orders of the defined types were placed in this encounter.   Follow Up:  In Person in 1 year(s)  Signed, Debbe Odea, MD  09/25/2020  12:57 PM    [facility]

## 2020-09-25 NOTE — Patient Instructions (Signed)
Medication Instructions:  Your physician recommends that you continue on your current medications as directed. Please refer to the Current Medication list given to you today.  *If you need a refill on your cardiac medications before your next appointment, please call your pharmacy*   Lab Work: None ordered If you have labs (blood work) drawn today and your tests are completely normal, you will receive your results only by: Marland Kitchen MyChart Message (if you have MyChart) OR . A paper copy in the mail If you have any lab test that is abnormal or we need to change your treatment, we will call you to review the results.   Testing/Procedures: None ordered   Follow-Up: At [facility], you and your health needs are our priority.  As part of our continuing mission to provide you with exceptional heart care, we have created designated Provider Care Teams.  These Care Teams include your primary Cardiologist (physician) and Advanced Practice Providers (APPs -  Physician Assistants and Nurse Practitioners) who all work together to provide you with the care you need, when you need it.  We recommend signing up for the patient portal called "MyChart".  Sign up information is provided on this After Visit Summary.  MyChart is used to connect with patients for Virtual Visits (Telemedicine).  Patients are able to view lab/test results, encounter notes, upcoming appointments, etc.  Non-urgent messages can be sent to your provider as well.   To learn more about what you can do with MyChart, go to [email or web address].    Your next appointment:   Your physician wants you to follow-up in: 12 months You will receive a reminder letter in the mail two months in advance. If you don't receive a letter, please call our office to schedule the follow-up appointment.   The format for your next appointment:   In Person  Provider:   You may see Debbe Odea, MD or one of the following Advanced Practice Providers on  your designated Care Team:    Nicolasa Ducking, NP  Eula Listen, PA-C  Marisue Ivan, PA-C  Cadence Stickney, [place]  Gillian Shields, NP    Other Instructions N/A

## 2020-10-21 ENCOUNTER — Telehealth: Payer: Self-pay

## 2020-10-21 NOTE — Telephone Encounter (Signed)
Patient called and believes she is having side effects from a GI surgery she had done in 07/2019, she has been advised to call [facility] and speak with someone and to call us back if she needs her pcp to do anything further. Polk Minor

## 2020-10-28 ENCOUNTER — Ambulatory Visit: Payer: Medicaid Other | Admitting: Hospice and Palliative Medicine

## 2020-10-28 ENCOUNTER — Other Ambulatory Visit: Payer: Self-pay

## 2020-10-28 ENCOUNTER — Encounter: Payer: Self-pay | Admitting: Hospice and Palliative Medicine

## 2020-10-28 ENCOUNTER — Telehealth: Payer: Self-pay

## 2020-10-28 VITALS — BP 128/82 | HR 100 | Temp 97.3°F | Resp 16 | Ht 67.5 in | Wt 187.0 lb

## 2020-10-28 DIAGNOSIS — M5412 Radiculopathy, cervical region: Secondary | ICD-10-CM

## 2020-10-28 DIAGNOSIS — G4700 Insomnia, unspecified: Secondary | ICD-10-CM

## 2020-10-28 DIAGNOSIS — F17219 Nicotine dependence, cigarettes, with unspecified nicotine-induced disorders: Secondary | ICD-10-CM | POA: Diagnosis not present

## 2020-10-28 DIAGNOSIS — J449 Chronic obstructive pulmonary disease, unspecified: Secondary | ICD-10-CM

## 2020-10-28 DIAGNOSIS — R1031 Right lower quadrant pain: Secondary | ICD-10-CM

## 2020-10-28 DIAGNOSIS — I10 Essential (primary) hypertension: Secondary | ICD-10-CM

## 2020-10-28 DIAGNOSIS — E782 Mixed hyperlipidemia: Secondary | ICD-10-CM

## 2020-10-28 MED ORDER — IPRATROPIUM-ALBUTEROL 0.5-2.5 (3) MG/3ML IN SOLN: 3.0000 mL | Freq: Four times a day (QID) | RESPIRATORY_TRACT | 1 refills | Status: DC | PRN

## 2020-10-28 MED ORDER — ALPRAZOLAM 0.5 MG PO TABS: 0.5000 mg | ORAL_TABLET | Freq: Every evening | ORAL | 1 refills | Status: DC | PRN

## 2020-10-28 MED ORDER — HYDROCHLOROTHIAZIDE 25 MG PO TABS: 12.0000 mg | ORAL_TABLET | Freq: Every day | ORAL | 3 refills | Status: DC

## 2020-10-28 MED ORDER — NICOTINE 21 MG/24HR TD PT24: 21.0000 mg | MEDICATED_PATCH | Freq: Every day | TRANSDERMAL | 1 refills | Status: DC

## 2020-10-28 MED ORDER — ALBUTEROL SULFATE HFA 108 (90 BASE) MCG/ACT IN AERS: INHALATION_SPRAY | RESPIRATORY_TRACT | 1 refills | Status: DC

## 2020-10-28 MED ORDER — BUDESONIDE-FORMOTEROL FUMARATE 80-4.5 MCG/ACT IN AERO: 2.0000 | INHALATION_SPRAY | Freq: Two times a day (BID) | RESPIRATORY_TRACT | 3 refills | Status: DC
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
## 2020-10-28 NOTE — Telephone Encounter (Signed)
Can we forward this to Nimisha to take care of? Sorry, I do not have that option. Thanks!

## 2020-10-29 NOTE — Telephone Encounter (Signed)
Pt advised labslip ready of pickup

## 2020-10-30 MED ORDER — ATORVASTATIN CALCIUM 40 MG PO TABS: 40.0000 mg | ORAL_TABLET | Freq: Every day | ORAL | 2 refills | Status: DC

## 2020-10-31 ENCOUNTER — Encounter: Payer: Self-pay | Admitting: Hospice and Palliative Medicine

## 2020-11-04 ENCOUNTER — Telehealth: Payer: Self-pay

## 2020-11-04 NOTE — Telephone Encounter (Signed)
Left message and asked pt to call back and reschedule ultrasound on 11/04/20 due to weather. Traci Cross

## 2020-11-06 ENCOUNTER — Other Ambulatory Visit: Payer: Medicaid Other

## 2020-11-11 ENCOUNTER — Ambulatory Visit: Payer: Medicaid Other

## 2020-11-11 DIAGNOSIS — R1031 Right lower quadrant pain: Secondary | ICD-10-CM

## 2020-12-03 ENCOUNTER — Other Ambulatory Visit: Payer: Self-pay | Admitting: Hospice and Palliative Medicine

## 2020-12-03 ENCOUNTER — Telehealth: Payer: Self-pay

## 2020-12-03 NOTE — Telephone Encounter (Signed)
Pt called asking for ultrasound results and refill on xanax.  Per Ladona Ridgel I told pt that ultrasound was ok and that if she is still having issues that we can refer to GI and that she may need to have a colonoscopy.  Told pt that she still has a refill for feb left and for her to schedule a closer appt in march to be seen for more refills.

## 2020-12-08 ENCOUNTER — Ambulatory Visit: Payer: Medicaid Other | Admitting: Gastroenterology

## 2020-12-08 ENCOUNTER — Encounter: Payer: Self-pay | Admitting: *Deleted

## 2020-12-23 DIAGNOSIS — Z6829 Body mass index (BMI) 29.0-29.9, adult: Secondary | ICD-10-CM | POA: Insufficient documentation

## 2021-01-08 ENCOUNTER — Telehealth: Payer: Self-pay

## 2021-01-08 NOTE — Telephone Encounter (Signed)
Completed medical records for [facility] Mailed records to [facility] [address] [place] [address]

## 2021-01-11 ENCOUNTER — Telehealth: Payer: Self-pay

## 2021-01-11 NOTE — Telephone Encounter (Signed)
Completed medical records for [facility] Mailed to [facility] [address]  [place] [address] Faxed payment request to [phone] for $28.00

## 2021-01-18 ENCOUNTER — Other Ambulatory Visit: Payer: Self-pay | Admitting: Hospice and Palliative Medicine

## 2021-02-02 ENCOUNTER — Other Ambulatory Visit: Payer: Self-pay

## 2021-02-02 ENCOUNTER — Ambulatory Visit (INDEPENDENT_AMBULATORY_CARE_PROVIDER_SITE_OTHER): Payer: Medicaid Other | Admitting: Hospice and Palliative Medicine

## 2021-02-02 ENCOUNTER — Encounter: Payer: Self-pay | Admitting: Hospice and Palliative Medicine

## 2021-02-02 VITALS — BP 138/84 | HR 76 | Temp 98.7°F | Resp 16 | Ht 67.0 in | Wt 197.7 lb

## 2021-02-02 DIAGNOSIS — M5412 Radiculopathy, cervical region: Secondary | ICD-10-CM

## 2021-02-02 DIAGNOSIS — R5383 Other fatigue: Secondary | ICD-10-CM | POA: Diagnosis not present

## 2021-02-02 DIAGNOSIS — J449 Chronic obstructive pulmonary disease, unspecified: Secondary | ICD-10-CM | POA: Diagnosis not present

## 2021-02-02 DIAGNOSIS — G8929 Other chronic pain: Secondary | ICD-10-CM

## 2021-02-02 DIAGNOSIS — Z1211 Encounter for screening for malignant neoplasm of colon: Secondary | ICD-10-CM

## 2021-02-02 DIAGNOSIS — G4700 Insomnia, unspecified: Secondary | ICD-10-CM

## 2021-02-02 MED ORDER — BUDESONIDE-FORMOTEROL FUMARATE 80-4.5 MCG/ACT IN AERO: 2.0000 | INHALATION_SPRAY | Freq: Two times a day (BID) | RESPIRATORY_TRACT | 3 refills | Status: DC

## 2021-02-02 MED ORDER — ALPRAZOLAM 0.5 MG PO TABS: 0.5000 mg | ORAL_TABLET | Freq: Every evening | ORAL | 1 refills | Status: DC | PRN

## 2021-02-02 MED ORDER — CELECOXIB 200 MG PO CAPS: 200.0000 mg | ORAL_CAPSULE | Freq: Two times a day (BID) | ORAL | 1 refills | Status: DC

## 2021-02-02 NOTE — Progress Notes (Signed)
[facility] [address] [place], [place] [address]  Internal MEDICINE  Office Visit Note  Patient Name: Traci Cross  [id number]  [id number]  Date of Service: 02/09/2021  Chief Complaint  Patient presents with  . Follow-up  . Depression  . COPD  . Hypertension  . Quality Metric Gaps    Colonoscopy    HPI Patient is here for routine follow-up Has been seen by specialist for neck pain and was told to quit smoking in order to have surgery She has completely stopped smoking and has a follow-up with specialist later this month in order to scheduled her surgery Praised for complete smoking cessation Was a difficult process but overall if feeling better since quitting  Has been without alprazolam for several weeks--sleep has been difficult and feels at times that her anxiety has been uncontrolled Willing to discuss possible alternative options or possible sleep study for underlying causes of insomnia but requesting to hold off until after surgery  Due for routine fasting labs and screening colonoscopy  Current Medication: Outpatient Encounter Medications as of 02/02/2021  Medication Sig  . albuterol (VENTOLIN HFA) 108 (90 Base) MCG/ACT inhaler INHALE 2 PUFFS INTO THE LUNGS EVERY 6 HOURS AS NEEDED FOR WHEEZING OR SHORTNESS OF BREATH  . aspirin EC 81 MG tablet Take 81 mg by mouth daily. Swallow whole.  Marland Kitchen atorvastatin (LIPITOR) 40 MG tablet Take 1 tablet (40 mg total) by mouth daily.  . hydrochlorothiazide (HYDRODIURIL) 25 MG tablet Take 0.5 tablets (12.5 mg total) by mouth daily.  Marland Kitchen ipratropium-albuterol (DUONEB) 0.5-2.5 (3) MG/3ML SOLN Take 3 mLs by nebulization every 6 (six) hours as needed.  . metoprolol tartrate (LOPRESSOR) 100 MG tablet Take 1 tablet (100 mg ) by mouth 2 hours prior to Cardiac CT  . OXYGEN Inhale 2 L into the lungs at bedtime.  . [DISCONTINUED] ALPRAZolam (XANAX) 0.5 MG tablet Take 1 tablet (0.5 mg total) by mouth at bedtime as needed for anxiety.   . [DISCONTINUED] budesonide-formoterol (SYMBICORT) 80-4.5 MCG/ACT inhaler Inhale 2 puffs into the lungs 2 (two) times daily. For copd  . [DISCONTINUED] celecoxib (CELEBREX) 200 MG capsule Take 1 capsule (200 mg total) by mouth 2 (two) times daily.  . [DISCONTINUED] nicotine (NICODERM CQ) 21 mg/24hr patch Place 1 patch (21 mg total) onto the skin daily.  Marland Kitchen ALPRAZolam (XANAX) 0.5 MG tablet Take 1 tablet (0.5 mg total) by mouth at bedtime as needed for anxiety.  . budesonide-formoterol (SYMBICORT) 80-4.5 MCG/ACT inhaler Inhale 2 puffs into the lungs 2 (two) times daily. For copd  . celecoxib (CELEBREX) 200 MG capsule Take 1 capsule (200 mg total) by mouth 2 (two) times daily.   No facility-administered encounter medications on file as of 02/02/2021.    Surgical History: Past Surgical History:  Procedure Laterality Date  . CHOLECYSTECTOMY    . ERCP N/A 08/08/2019   Procedure: ENDOSCOPIC RETROGRADE CHOLANGIOPANCREATOGRAPHY (ERCP);  Surgeon: Midge Minium, MD;  Location: [facility] ENDOSCOPY;  Service: Endoscopy;  Laterality: N/A;  . LAPAROSCOPIC APPENDECTOMY N/A 06/29/2019   Procedure: APPENDECTOMY LAPAROSCOPIC cecectomy;  Surgeon: Leafy Ro, MD;  Location: [facility] ORS;  Service: General;  Laterality: N/A;  . TONSILLECTOMY      Medical History: Past Medical History:  Diagnosis Date  . Asthma   . COPD (chronic obstructive pulmonary disease) (HCC)   . CVA (cerebral vascular accident) (HCC) 04/19/2019  . Depression   . Hypertension   . Sleep apnea   . Tobacco abuse     Family History:  Family History  Problem Relation Age of Onset  . Heart failure Mother   . Stroke Mother   . Alzheimer's disease Mother   . Heart failure Father   . Stroke Father   . Dementia Father   . Alzheimer's disease Sister     Social History   Socioeconomic History  . Marital status: Single    Spouse name: Not on file  . Number of children: Not on file  . Years of education: Not on file  . Highest education  level: Not on file  Occupational History  . Not on file  Tobacco Use  . Smoking status: Former Smoker    Packs/day: 0.25    Years: 35.00    Pack years: 8.75  . Smokeless tobacco: Never Used  . Tobacco comment: stopped 4 weeks ago 02/02/2021  Vaping Use  . Vaping Use: Never used  Substance and Sexual Activity  . Alcohol use: No    Alcohol/week: 0.0 standard drinks  . Drug use: No  . Sexual activity: Not on file  Other Topics Concern  . Not on file  Social History Narrative   Lives at home alone   Right handed   Caffeine: up to a 2L/day of soda    Social Determinants of Health   Financial Resource Strain: Not on file  Food Insecurity: Not on file  Transportation Needs: Not on file  Physical Activity: Not on file  Stress: Not on file  Social Connections: Not on file  Intimate Partner Violence: Not on file      Review of Systems  Constitutional: Negative for chills, diaphoresis and fatigue.  HENT: Negative for ear pain, postnasal drip and sinus pressure.   Eyes: Negative for photophobia, discharge, redness, itching and visual disturbance.  Respiratory: Negative for cough, shortness of breath and wheezing.   Cardiovascular: Negative for chest pain, palpitations and leg swelling.  Gastrointestinal: Negative for abdominal pain, constipation, diarrhea, nausea and vomiting.  Genitourinary: Negative for dysuria and flank pain.  Musculoskeletal: Positive for back pain, neck pain and neck stiffness. Negative for arthralgias and gait problem.  Skin: Negative for color change.  Allergic/Immunologic: Negative for environmental allergies and food allergies.  Neurological: Negative for dizziness and headaches.  Hematological: Does not bruise/bleed easily.  Psychiatric/Behavioral: Negative for agitation, behavioral problems (depression) and hallucinations.    Vital Signs: BP 138/84   Pulse 76   Temp 98.7 F (37.1 C)   Resp 16   Ht 5\' 7"  (1.702 m)   Wt 197 lb 11.2 oz (89.7 kg)    SpO2 96%   BMI 30.96 kg/m    Physical Exam Vitals reviewed.  Constitutional:      Appearance: Normal appearance.  Cardiovascular:     Rate and Rhythm: Normal rate and regular rhythm.     Pulses: Normal pulses.     Heart sounds: Normal heart sounds.  Pulmonary:     Effort: Pulmonary effort is normal.     Breath sounds: Normal breath sounds.  Abdominal:     General: Abdomen is flat.     Palpations: Abdomen is soft.  Musculoskeletal:        General: Normal range of motion.     Cervical back: Normal range of motion.  Skin:    General: Skin is warm.  Neurological:     General: No focal deficit present.     Mental Status: She is alert and oriented to person, place, and time. Mental status is at baseline.  Psychiatric:  Mood and Affect: Mood normal.        Behavior: Behavior normal.        Thought Content: Thought content normal.        Judgment: Judgment normal.    Assessment/Plan: 1. Chronic radicular cervical pain Requesting refills of Celebrex, followed by specialist will be scheduled for upcoming surgery - celecoxib (CELEBREX) 200 MG capsule; Take 1 capsule (200 mg total) by mouth 2 (two) times daily.  Dispense: 180 capsule; Refill: 1  2. Chronic obstructive pulmonary disease, unspecified COPD type (HCC) Breathing remains well controlled, requesting refills - budesonide-formoterol (SYMBICORT) 80-4.5 MCG/ACT inhaler; Inhale 2 puffs into the lungs 2 (two) times daily. For copd  Dispense: 6.9 g; Refill: 3  3. Insomnia, unspecified type May continue with alprazolam as needed for insomnia as well as uncontrolled anxiety Will consider possible PSG after back surgery [place] Controlled Substance Database was reviewed by me for overdose risk score (ORS) Reviewed risks and possible side effects associated with taking opiates, benzodiazepines and other CNS depressants. Combination of these could cause dizziness and drowsiness. Advised patient not to drive or operate machinery  when taking these medications, as patient's and other's life can be at risk and will have consequences. Patient verbalized understanding in this matter. Dependence and abuse for these drugs will be monitored closely. A Controlled substance policy and procedure is on file which allows [facility] to order a urine drug screen test at any visit. Patient understands and agrees with the plan - ALPRAZolam (XANAX) 0.5 MG tablet; Take 1 tablet (0.5 mg total) by mouth at bedtime as needed for anxiety.  Dispense: 30 tablet; Refill: 1  4. Screening for colon cancer - Ambulatory referral to Gastroenterology  5. Other fatigue - CBC w/Diff/Platelet - Comprehensive Metabolic Panel (CMET) - Lipid Panel With LDL/HDL Ratio - TSH + free T4 - Vitamin D (25 hydroxy)  General Counseling: odeth bry understanding of the findings of todays visit and agrees with plan of treatment. I have discussed any further diagnostic evaluation that may be needed or ordered today. We also reviewed her medications today. she has been encouraged to call the office with any questions or concerns that should arise related to todays visit.    Orders Placed This Encounter  Procedures  . CBC w/Diff/Platelet  . Comprehensive Metabolic Panel (CMET)  . Lipid Panel With LDL/HDL Ratio  . TSH + free T4  . Vitamin D (25 hydroxy)  . Ambulatory referral to Gastroenterology    Meds ordered this encounter  Medications  . ALPRAZolam (XANAX) 0.5 MG tablet    Sig: Take 1 tablet (0.5 mg total) by mouth at bedtime as needed for anxiety.    Dispense:  30 tablet    Refill:  1  . celecoxib (CELEBREX) 200 MG capsule    Sig: Take 1 capsule (200 mg total) by mouth 2 (two) times daily.    Dispense:  180 capsule    Refill:  1  . budesonide-formoterol (SYMBICORT) 80-4.5 MCG/ACT inhaler    Sig: Inhale 2 puffs into the lungs 2 (two) times daily. For copd    Dispense:  6.9 g    Refill:  3    Time spent: 30 Minutes Time spent  includes review of chart, medications, test results and follow-up plan with the patient.  This patient was seen by Leeanne Deed AGNP-C in Collaboration with Dr Lyndon Code as a part of collaborative care agreement     Lubertha Basque. Megon Kalina AGNP-C Internal medicine

## 2021-02-05 ENCOUNTER — Telehealth: Payer: Self-pay

## 2021-02-05 NOTE — Telephone Encounter (Signed)
Completed medical records for [facility] Faxed to [phone] Phone [phone]

## 2021-02-09 ENCOUNTER — Encounter: Payer: Self-pay | Admitting: Hospice and Palliative Medicine

## 2021-02-10 ENCOUNTER — Other Ambulatory Visit: Payer: Self-pay

## 2021-02-10 ENCOUNTER — Telehealth: Payer: Self-pay

## 2021-02-10 ENCOUNTER — Other Ambulatory Visit: Payer: Self-pay | Admitting: Neurological Surgery

## 2021-02-10 NOTE — Telephone Encounter (Signed)
Completed medical records for [facility] Automated records collection Mailed to [address]  [place]

## 2021-02-10 NOTE — Telephone Encounter (Addendum)
Completed medical records for [facility] Automated records collection Mailed to [address]  [place] [address] Faxed payment request to [phone] $ 57.50

## 2021-02-10 NOTE — Progress Notes (Deleted)
[facility] [address]  [place] [address] Faxed payment request to [phone] for $57.50

## 2021-02-18 ENCOUNTER — Other Ambulatory Visit: Payer: Self-pay

## 2021-02-18 ENCOUNTER — Telehealth (INDEPENDENT_AMBULATORY_CARE_PROVIDER_SITE_OTHER): Payer: Self-pay | Admitting: Gastroenterology

## 2021-02-18 DIAGNOSIS — Z1211 Encounter for screening for malignant neoplasm of colon: Secondary | ICD-10-CM

## 2021-02-18 MED ORDER — NA SULFATE-K SULFATE-MG SULF 17.5-3.13-1.6 GM/177ML PO SOLN: 1.0000 | Freq: Once | ORAL | 0 refills | Status: AC

## 2021-02-18 NOTE — Progress Notes (Signed)
colon

## 2021-02-18 NOTE — Progress Notes (Signed)
Gastroenterology Pre-Procedure Review  Request Date: 04/13/21 Requesting Physician: Dr. Servando Snare  PATIENT REVIEW QUESTIONS: The patient responded to the following health history questions as indicated:    1. Are you having any GI issues? no 2. Do you have a personal history of Polyps? no 3. Do you have a family history of Colon Cancer or Polyps? no 4. Diabetes Mellitus? no 5. Joint replacements in the past 12 months?Neck surgery 03/05/21 6. Major health problems in the past 3 months?no 7. Any artificial heart valves, MVP, or defibrillator?no    MEDICATIONS & ALLERGIES:    Patient reports the following regarding taking any anticoagulation/antiplatelet therapy:   Plavix, Coumadin, Eliquis, Xarelto, Lovenox, Pradaxa, Brilinta, or Effient? no Aspirin? yes (81 mg daily)  Patient confirms/reports the following medications:  Current Outpatient Medications  Medication Sig Dispense Refill  . albuterol (VENTOLIN HFA) 108 (90 Base) MCG/ACT inhaler INHALE 2 PUFFS INTO THE LUNGS EVERY 6 HOURS AS NEEDED FOR WHEEZING OR SHORTNESS OF BREATH 54 g 1  . ALPRAZolam (XANAX) 0.5 MG tablet Take 1 tablet (0.5 mg total) by mouth at bedtime as needed for anxiety. 30 tablet 1  . aspirin EC 81 MG tablet Take 81 mg by mouth daily. Swallow whole.    Marland Kitchen atorvastatin (LIPITOR) 40 MG tablet Take 1 tablet (40 mg total) by mouth daily. 90 tablet 2  . budesonide-formoterol (SYMBICORT) 80-4.5 MCG/ACT inhaler Inhale 2 puffs into the lungs 2 (two) times daily. For copd 6.9 g 3  . celecoxib (CELEBREX) 200 MG capsule Take 1 capsule (200 mg total) by mouth 2 (two) times daily. 180 capsule 1  . hydrochlorothiazide (HYDRODIURIL) 25 MG tablet Take 0.5 tablets (12.5 mg total) by mouth daily. 90 tablet 3  . metoprolol tartrate (LOPRESSOR) 100 MG tablet Take 1 tablet (100 mg ) by mouth 2 hours prior to Cardiac CT 1 tablet 0  . OXYGEN Inhale 2 L into the lungs at bedtime.    Marland Kitchen ipratropium-albuterol (DUONEB) 0.5-2.5 (3) MG/3ML SOLN Take 3  mLs by nebulization every 6 (six) hours as needed. (Patient not taking: Reported on 02/18/2021) 360 mL 1   No current facility-administered medications for this visit.    Patient confirms/reports the following allergies:  Allergies  Allergen Reactions  . Levaquin [Levofloxacin] Hives    Dr notified.   . Codeine Hives  . Hydrocodone Hives  . Penicillins Hives    No orders of the defined types were placed in this encounter.   AUTHORIZATION INFORMATION Primary Insurance: 1D#: Group #:  Secondary Insurance: 1D#: Group #:  SCHEDULE INFORMATION: Date: 04/13/21 Time: Location:[facility]

## 2021-02-23 ENCOUNTER — Other Ambulatory Visit: Payer: Self-pay

## 2021-02-23 ENCOUNTER — Telehealth: Payer: Self-pay

## 2021-02-23 DIAGNOSIS — Z1211 Encounter for screening for malignant neoplasm of colon: Secondary | ICD-10-CM

## 2021-02-23 NOTE — Telephone Encounter (Signed)
Patient call to inform staff that form will be faxed over, for pt to receive pull-up, pads ect.

## 2021-02-24 ENCOUNTER — Telehealth: Payer: Self-pay

## 2021-02-24 ENCOUNTER — Other Ambulatory Visit: Payer: Self-pay

## 2021-02-24 DIAGNOSIS — G4700 Insomnia, unspecified: Secondary | ICD-10-CM

## 2021-02-24 DIAGNOSIS — N393 Stress incontinence (female) (male): Secondary | ICD-10-CM | POA: Insufficient documentation

## 2021-02-24 MED ORDER — ALPRAZOLAM 0.5 MG PO TABS: ORAL_TABLET | ORAL | 0 refills | Status: DC

## 2021-02-24 NOTE — Telephone Encounter (Signed)
John with [facility] phone# [phone] LMOM. I returned call and LMOM, records are due to be mailed out and faxed records to [facility] 02-05-21. If they need more records sent then please resend request

## 2021-02-24 NOTE — Telephone Encounter (Signed)
Pt called that xanax 0.5mg  is not helping for her sleep 2 tab  Help her as per dr Welton Flakes send xanax 0.5mg  1-2 tab bedtime 60 with no refills and also advised pt need appt refill

## 2021-02-25 ENCOUNTER — Telehealth: Payer: Self-pay

## 2021-02-25 NOTE — Telephone Encounter (Signed)
Faxed home care delivered for incontinence supply

## 2021-03-02 NOTE — Pre-Procedure Instructions (Signed)
Traci Cross  03/02/2021      [facility] - [place], [place] - [address] [place] [address] Phone: [phone] Fax: [phone]    Your procedure is scheduled on May 20  Report to [facility] Entrance A at 9:15 A.M.  Call this number if you have problems the morning of surgery:  [phone]   Remember:  Do not eat or drink after midnight.      Take these medicines the morning of surgery with A SIP OF WATER :              Albuterol inhaler if needed--bring to hospital             Atorvastatin (lipitor)             symbicort -bring to hospital             Metoprolol (lopressor)                                 7 days prior to surgery STOP taking any Aspirin (unless otherwise instructed by your surgeon), Aleve, Naproxen, Ibuprofen, Motrin, Advil, Goody's, BC's, all herbal medications, fish oil, and all vitamins.                   Follow your surgeon's instructions on when to stop Aspirin.  If no instructions were given by your surgeon then you will need to call the office to get those instructions.      Do not wear jewelry, make-up or nail polish.  Do not wear lotions, powders, or perfumes, or deodorant.  Do not shave 48 hours prior to surgery.  Men may shave face and neck.  Do not bring valuables to the hospital.  [facility] is not responsible for any belongings or valuables.  Contacts, dentures or bridgework may not be worn into surgery.  Leave your suitcase in the car.  After surgery it may be brought to your room.  For patients admitted to the hospital, discharge time will be determined by your treatment team.  Patients discharged the day of surgery will not be allowed to drive home.    Special instructions:  [place]- Preparing For Surgery  Before surgery, you can play an important role. Because skin is not sterile, your skin needs to be as free of germs as possible. You can reduce the number of germs on your skin by washing  with CHG (chlorahexidine gluconate) Soap before surgery.  CHG is an antiseptic cleaner which kills germs and bonds with the skin to continue killing germs even after washing.    Oral Hygiene is also important to reduce your risk of infection.  Remember - BRUSH YOUR TEETH THE MORNING OF SURGERY WITH YOUR REGULAR TOOTHPASTE  Please do not use if you have an allergy to CHG or antibacterial soaps. If your skin becomes reddened/irritated stop using the CHG.  Do not shave (including legs and underarms) for at least 48 hours prior to first CHG shower. It is OK to shave your face.  Please follow these instructions carefully.   1. Shower the NIGHT BEFORE SURGERY and the MORNING OF SURGERY with CHG.   2. If you chose to wash your hair, wash your hair first as usual with your normal shampoo.  3. After you shampoo, rinse your hair and body thoroughly to remove the shampoo.  4. Use CHG  as you would any other liquid soap. You can apply CHG directly to the skin and wash gently with a scrungie or a clean washcloth.   5. Apply the CHG Soap to your body ONLY FROM THE NECK DOWN.  Do not use on open wounds or open sores. Avoid contact with your eyes, ears, mouth and genitals (private parts). Wash Face and genitals (private parts)  with your normal soap.  6. Wash thoroughly, paying special attention to the area where your surgery will be performed.  7. Thoroughly rinse your body with warm water from the neck down.  8. DO NOT shower/wash with your normal soap after using and rinsing off the CHG Soap.  9. Pat yourself dry with a CLEAN TOWEL.  10. Wear CLEAN PAJAMAS to bed the night before surgery, wear comfortable clothes the morning of surgery  11. Place CLEAN SHEETS on your bed the night of your first shower and DO NOT SLEEP WITH PETS.    Day of Surgery:  Do not apply any deodorants/lotions.  Please wear clean clothes to the hospital/surgery center.   Remember to brush your teeth WITH YOUR REGULAR  TOOTHPASTE.    Please read over the following fact sheets that you were given.

## 2021-03-03 ENCOUNTER — Other Ambulatory Visit: Payer: Self-pay

## 2021-03-03 ENCOUNTER — Encounter (HOSPITAL_COMMUNITY)
Admission: RE | Admit: 2021-03-03 | Discharge: 2021-03-03 | Disposition: A | Discharge status: Home / Self Care | Payer: Medicaid Other | Source: Ambulatory Visit | Attending: Neurological Surgery | Admitting: Neurological Surgery

## 2021-03-03 ENCOUNTER — Encounter (HOSPITAL_COMMUNITY): Payer: Self-pay

## 2021-03-03 DIAGNOSIS — Z01812 Encounter for preprocedural laboratory examination: Secondary | ICD-10-CM | POA: Diagnosis not present

## 2021-03-03 DIAGNOSIS — U071 COVID-19: Secondary | ICD-10-CM | POA: Diagnosis not present

## 2021-03-03 HISTORY — DX: Unspecified osteoarthritis, unspecified site: M19.90

## 2021-03-03 HISTORY — DX: Personal history of urinary calculi: Z87.442

## 2021-03-03 HISTORY — DX: Headache, unspecified: R51.9

## 2021-03-03 LAB — BASIC METABOLIC PANEL
Anion gap: 5 (ref 5–15)
BUN: 20 mg/dL (ref 6–20)
CO2: 27 mmol/L (ref 22–32)
Calcium: 8.2 mg/dL — ABNORMAL LOW (ref 8.9–10.3)
Chloride: 107 mmol/L (ref 98–111)
Creatinine, Ser: 0.69 mg/dL (ref 0.44–1.00)
GFR, Estimated: >60 mL/min (ref >60)
Glucose, Bld: 93 mg/dL (ref 70–99)
Potassium: 3.9 mmol/L (ref 3.5–5.1)
Sodium: 139 mmol/L (ref 135–145)

## 2021-03-03 LAB — TYPE AND SCREEN
ABO/RH(D): O POS
Antibody Screen: NEGATIVE

## 2021-03-03 LAB — CBC WITH DIFFERENTIAL/PLATELET
Abs Immature Granulocytes: 0.03 10*3/uL (ref 0.00–0.07)
Basophils Absolute: 0 10*3/uL (ref 0.0–0.1)
Basophils Relative: 1 %
Eosinophils Absolute: 0.3 10*3/uL (ref 0.0–0.5)
Eosinophils Relative: 5 %
HCT: 41.3 % (ref 36.0–46.0)
Hemoglobin: 13.9 g/dL (ref 12.0–15.0)
Immature Granulocytes: 1 %
Lymphocytes Relative: 30 %
Lymphs Abs: 1.5 10*3/uL (ref 0.7–4.0)
MCH: 30.3 pg (ref 26.0–34.0)
MCHC: 33.7 g/dL (ref 30.0–36.0)
MCV: 90 fL (ref 80.0–100.0)
Monocytes Absolute: 0.4 10*3/uL (ref 0.1–1.0)
Monocytes Relative: 8 %
Neutro Abs: 2.7 10*3/uL (ref 1.7–7.7)
Neutrophils Relative %: 55 %
Platelets: 204 10*3/uL (ref 150–400)
RBC: 4.59 MIL/uL (ref 3.87–5.11)
RDW: 12.4 % (ref 11.5–15.5)
WBC: 4.8 10*3/uL (ref 4.0–10.5)
nRBC: 0 % (ref 0.0–0.2)

## 2021-03-03 LAB — SARS CORONAVIRUS 2 (TAT 6-24 HRS): SARS Coronavirus 2: POSITIVE — AB

## 2021-03-03 LAB — PROTIME-INR
INR: 0.9 (ref 0.8–1.2)
Prothrombin Time: 12.5 seconds (ref 11.4–15.2)

## 2021-03-03 LAB — SURGICAL PCR SCREEN
MRSA, PCR: NEGATIVE
Staphylococcus aureus: POSITIVE — AB

## 2021-03-03 NOTE — Progress Notes (Signed)
PCP - Beverely Risen, MD Cardiologist - Denies, although pt previously saw Debbe Odea, MD for a televisit in 2021 Pulmonologist- pt denies  PPM/ICD - Denies  Chest x-ray - N/A EKG - 08/07/20 Stress Test - Denies ECHO - 04/21/19 Cardiac Cath - Denies  Sleep Study - 05/08/19, Per pt, negative for OSA CPAP - N/A  Pt denies being diabetic.  Blood Thinner Instructions: N/A Aspirin Instructions: Per pt, last dose 03/03/21  ERAS Protcol - N/A PRE-SURGERY Ensure or G2- N/A  COVID TEST- 03/03/21   Anesthesia review: Pulm hx, on 2L O2 @HS   Patient denies shortness of breath, fever, cough and chest pain at PAT appointment   All instructions explained to the patient, with a verbal understanding of the material. Patient agrees to go over the instructions while at home for a better understanding. Patient also instructed to self quarantine after being tested for COVID-19. The opportunity to ask questions was provided.

## 2021-03-03 NOTE — Pre-Procedure Instructions (Signed)
Surgical Instructions:    Your procedure is scheduled on Friday, May 20th (11:15 AM- 2:43 PM).  Report to [facility] Main Entrance "A" at 09:15 A.M., then check in with the Admitting office.  Call this number if you have any questions prior to, or have any problems the morning of surgery:  [phone]    Remember:  Do not eat or drink after midnight the night before your surgery.     Take these medicines the morning of surgery: budesonide-formoterol (SYMBICORT) inhaler  IF NEEDED: albuterol (VENTOLIN HFA) inhaler- Please with you the day of surgery    Follow your surgeon's instructions on when to stop Aspirin.  If no instructions were given by your surgeon then you will need to call the office to get those instructions.     As of today, STOP taking any celecoxib (CELEBREX), Aleve, Naproxen, Ibuprofen, Motrin, Advil, Goody's, BC's, all herbal medications, fish oil, and all vitamins.              Special instructions:   [place]- Preparing For Surgery  Before surgery, you can play an important role. Because skin is not sterile, your skin needs to be as free of germs as possible. You can reduce the number of germs on your skin by washing with CHG (chlorahexidine gluconate) Soap before surgery.  CHG is an antiseptic cleaner which kills germs and bonds with the skin to continue killing germs even after washing.    Oral Hygiene is also important to reduce your risk of infection.  Remember - BRUSH YOUR TEETH THE MORNING OF SURGERY WITH YOUR REGULAR TOOTHPASTE  Please do not use if you have an allergy to CHG or antibacterial soaps. If your skin becomes reddened/irritated stop using the CHG.  Do not shave (including legs and underarms) for at least 48 hours prior to first CHG shower. It is OK to shave your face.  Please follow these instructions carefully.   1. Shower the NIGHT BEFORE SURGERY and the MORNING OF SURGERY  2. If you chose to wash your hair, wash your hair first as  usual with your normal shampoo.  3. After you shampoo, rinse your hair and body thoroughly to remove the shampoo.  4. Wash Face and genitals (private parts) with your normal soap.   5. Use CHG Soap as you would any other liquid soap. You can apply CHG directly to the skin and wash gently with a scrungie or a clean washcloth.   6. Apply the CHG Soap to your body ONLY FROM THE NECK DOWN.  Do not use on open wounds or open sores. Avoid contact with your eyes, ears, mouth and genitals (private parts). Wash Face and genitals (private parts)  with your normal soap.   7. Wash thoroughly, paying special attention to the area where your surgery will be performed.  8. Thoroughly rinse your body with warm water from the neck down.  9. DO NOT shower/wash with your normal soap after using and rinsing off the CHG Soap.  10. Pat yourself dry with a CLEAN TOWEL.  11. Wear CLEAN PAJAMAS to bed the night before surgery.  12. Place CLEAN SHEETS on your bed the night before your surgery.  13. DO NOT SLEEP WITH PETS.   Day of Surgery: SHOWER with CHG soap. Brush your teeth WITH YOUR REGULAR TOOTHPASTE. Wear Clean/Comfortable clothing the morning of surgery. Do not apply any deodorants/lotions.   Do not wear jewelry, make up, or nail polish. Do not shave 48 hours  prior to surgery.   Do not bring valuables to the hospital. [facility] is not responsible for any belongings or valuables.  If you use a CPAP at night, you may bring all equipment for your overnight stay.   Do NOT Smoke (Tobacco/Vaping) or drink Alcohol 24 hours prior to your procedure.   Contacts, glasses, or dentures may not be worn into surgery, please bring cases for these belongings.   For patients admitted to the hospital, discharge time will be determined by your treatment team.   Patients discharged the day of surgery will not be allowed to drive home, and someone needs to stay with them for 24 hours.    Please read over the  following fact sheets that you were given.

## 2021-03-04 ENCOUNTER — Telehealth: Payer: Self-pay

## 2021-03-04 ENCOUNTER — Encounter (HOSPITAL_COMMUNITY): Payer: Self-pay | Admitting: Physician Assistant

## 2021-03-04 NOTE — Progress Notes (Signed)
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  Carotid duplex was unremarkable.  Echo showed preserved EF, 55 to 60%, impaired relaxation. Recent cardiology eval for DOE and palpitations.  Last seen by Dr. Azucena Cecil 09/25/2020.  Per note, coronary CTA 09/09/2020 showed calcium score of 66, mild disease in the mid left circumflex, minimal stenosis in the mid LAD.  Cardiac monitor x2 weeks with no evidence of atrial fibrillation or atrial flutter.  She was advised to continue medical management and follow-up in 1 year.  Patient is a recent former smoker, quit April 2022 in preparation for surgery per PCP notes.  She has a history of COPD and is maintained on Symbicort and supplemental oxygen 2 L nightly.  Preop labs reviewed, unremarkable.  Patient's preop COVID test was positive.  Surgeon notified.  EKG 08/07/2020: Sinus rhythm with PACs.  Rate 78.  Coronary CTA 09/09/2020: IMPRESSION: 1. Coronary calcium score of 66. This was 89th percentile for age and sex matched control.  2. Normal coronary origin with left dominance.  3. Calcified plaque in the mid LCx causing mild stenosis (25-49%)  4. Calcified plaque in the mid LAD causing minimal stenosis (<25%)  5. CAD-RADS 2. Mild non-obstructive CAD (25-49%). Consider non-atherosclerotic causes of chest pain. Consider preventive therapy and risk factor modification.  Event monitor 08/07/2020: No significant arrhythmias noted, no evidence of atrial fibrillation or atrial flutter. An episode of SVT/atrial tachycardia noted.  TTE 04/21/2019: 1. The left ventricle has normal systolic function, with an ejection  fraction of 55-60%. The cavity size was normal. There is mild concentric  left ventricular hypertrophy. Left ventricular diastolic Doppler  parameters are consistent with impaired  relaxation.  2.  The right ventricle has normal systolic function. The cavity was  normal. There is no increase in right ventricular wall thickness. Right  ventricular systolic pressure could not be assessed.  3. The mitral valve is grossly normal.  4. The tricuspid valve is grossly normal.  5. The aortic valve is abnormal. Mild calcification of the aortic valve.  No stenosis of the aortic valve.  6. No intracardiac thrombi or masses were visualized.    Zannie Cove [facility] Short Stay Center/Anesthesiology Phone [phone] 03/04/2021 9:14 AM

## 2021-03-04 NOTE — Progress Notes (Signed)
Per [name], Dr. Jake Samples is aware of covid result.

## 2021-03-04 NOTE — Telephone Encounter (Signed)
Pre-op clearance form signed by provider and faxed back to [facility] at [phone], and placed in scan. [name] Amend

## 2021-03-05 ENCOUNTER — Encounter (HOSPITAL_COMMUNITY): Admission: RE | Payer: Self-pay | Source: Home / Self Care

## 2021-03-05 ENCOUNTER — Ambulatory Visit (HOSPITAL_COMMUNITY): Admission: RE | Admit: 2021-03-05 | Payer: Medicaid Other | Source: Home / Self Care | Admitting: Neurological Surgery

## 2021-03-05 SURGERY — ANTERIOR CERVICAL DECOMPRESSION/DISCECTOMY FUSION 2 LEVELS
Anesthesia: General

## 2021-03-11 ENCOUNTER — Other Ambulatory Visit: Payer: Self-pay | Admitting: Neurological Surgery

## 2021-03-28 ENCOUNTER — Encounter (HOSPITAL_COMMUNITY): Payer: Self-pay | Admitting: Neurological Surgery

## 2021-03-28 NOTE — Progress Notes (Signed)
Pre-op instructions given to pt. NPO at midnight.  Pt. Verbalized understanding of instructions. She stopped aspirin 1 week ago.

## 2021-03-29 ENCOUNTER — Encounter: Payer: Self-pay | Admitting: Physician Assistant

## 2021-03-30 ENCOUNTER — Ambulatory Visit (HOSPITAL_COMMUNITY): Payer: Medicaid Other

## 2021-03-30 ENCOUNTER — Encounter (HOSPITAL_COMMUNITY)
Admission: RE | Disposition: A | Discharge status: Home / Self Care | Payer: Self-pay | Source: Home / Self Care | Attending: Neurological Surgery

## 2021-03-30 ENCOUNTER — Ambulatory Visit (HOSPITAL_COMMUNITY): Payer: Medicaid Other | Admitting: Physician Assistant

## 2021-03-30 ENCOUNTER — Encounter (HOSPITAL_COMMUNITY): Payer: Self-pay | Admitting: Neurological Surgery

## 2021-03-30 ENCOUNTER — Observation Stay (HOSPITAL_COMMUNITY)
Admission: RE | Admit: 2021-03-30 | Discharge: 2021-03-31 | Disposition: A | Discharge status: Home / Self Care | Payer: Medicaid Other | Attending: Neurological Surgery | Admitting: Neurological Surgery

## 2021-03-30 DIAGNOSIS — Z79899 Other long term (current) drug therapy: Secondary | ICD-10-CM | POA: Diagnosis not present

## 2021-03-30 DIAGNOSIS — J45909 Unspecified asthma, uncomplicated: Secondary | ICD-10-CM | POA: Diagnosis not present

## 2021-03-30 DIAGNOSIS — M47812 Spondylosis without myelopathy or radiculopathy, cervical region: Secondary | ICD-10-CM | POA: Diagnosis present

## 2021-03-30 DIAGNOSIS — Z87891 Personal history of nicotine dependence: Secondary | ICD-10-CM | POA: Diagnosis not present

## 2021-03-30 DIAGNOSIS — Z7982 Long term (current) use of aspirin: Secondary | ICD-10-CM | POA: Diagnosis not present

## 2021-03-30 DIAGNOSIS — J449 Chronic obstructive pulmonary disease, unspecified: Secondary | ICD-10-CM | POA: Diagnosis not present

## 2021-03-30 DIAGNOSIS — M4722 Other spondylosis with radiculopathy, cervical region: Secondary | ICD-10-CM | POA: Diagnosis present

## 2021-03-30 DIAGNOSIS — I10 Essential (primary) hypertension: Secondary | ICD-10-CM | POA: Insufficient documentation

## 2021-03-30 DIAGNOSIS — Z1211 Encounter for screening for malignant neoplasm of colon: Secondary | ICD-10-CM

## 2021-03-30 DIAGNOSIS — Z8673 Personal history of transient ischemic attack (TIA), and cerebral infarction without residual deficits: Secondary | ICD-10-CM | POA: Insufficient documentation

## 2021-03-30 DIAGNOSIS — Z419 Encounter for procedure for purposes other than remedying health state, unspecified: Secondary | ICD-10-CM

## 2021-03-30 HISTORY — DX: Dyspnea, unspecified: R06.00

## 2021-03-30 HISTORY — PX: NECK SURGERY: SHX720

## 2021-03-30 HISTORY — PX: ANTERIOR CERVICAL DECOMP/DISCECTOMY FUSION: SHX1161

## 2021-03-30 LAB — TYPE AND SCREEN
ABO/RH(D): O POS
Antibody Screen: NEGATIVE

## 2021-03-30 SURGERY — ANTERIOR CERVICAL DECOMPRESSION/DISCECTOMY FUSION 2 LEVELS
Anesthesia: General

## 2021-03-30 MED ORDER — EPHEDRINE SULFATE-NACL 50-0.9 MG/10ML-% IV SOSY
PREFILLED_SYRINGE | INTRAVENOUS | Status: DC | PRN
  Administered 2021-03-30: 10 mg via INTRAVENOUS

## 2021-03-30 MED ORDER — ACETAMINOPHEN 500 MG PO TABS
1000.0000 mg | ORAL_TABLET | Freq: Once | ORAL | Status: AC
  Administered 2021-03-30: 1000 mg via ORAL

## 2021-03-30 MED ORDER — ROCURONIUM BROMIDE 10 MG/ML (PF) SYRINGE
PREFILLED_SYRINGE | INTRAVENOUS | Status: AC
  Filled 2021-03-30: qty 10

## 2021-03-30 MED ORDER — CHLORHEXIDINE GLUCONATE 0.12 % MT SOLN
15.0000 mL | Freq: Once | OROMUCOSAL | Status: AC
  Administered 2021-03-30: 15 mL via OROMUCOSAL
  Filled 2021-03-30: qty 15

## 2021-03-30 MED ORDER — KETOROLAC TROMETHAMINE 15 MG/ML IJ SOLN
15.0000 mg | Freq: Four times a day (QID) | INTRAMUSCULAR | Status: DC
  Administered 2021-03-30 – 2021-03-31 (×3): 15 mg via INTRAVENOUS
  Filled 2021-03-30 (×3): qty 1

## 2021-03-30 MED ORDER — TRIAMCINOLONE ACETONIDE 40 MG/ML IJ SUSP
INTRAMUSCULAR | Status: DC | PRN
  Administered 2021-03-30: 40 mg via INTRAMUSCULAR

## 2021-03-30 MED ORDER — VANCOMYCIN HCL IN DEXTROSE 1-5 GM/200ML-% IV SOLN
INTRAVENOUS | Status: AC
  Administered 2021-03-30: 1000 mg via INTRAVENOUS
  Filled 2021-03-30: qty 200

## 2021-03-30 MED ORDER — ONDANSETRON HCL 4 MG/2ML IJ SOLN
INTRAMUSCULAR | Status: AC
  Filled 2021-03-30: qty 2

## 2021-03-30 MED ORDER — LIDOCAINE HCL (PF) 2 % IJ SOLN
INTRAMUSCULAR | Status: AC
  Filled 2021-03-30: qty 5

## 2021-03-30 MED ORDER — ALBUTEROL SULFATE (2.5 MG/3ML) 0.083% IN NEBU: 3.0000 mL | INHALATION_SOLUTION | Freq: Four times a day (QID) | RESPIRATORY_TRACT | Status: DC | PRN

## 2021-03-30 MED ORDER — LIDOCAINE 2% (20 MG/ML) 5 ML SYRINGE
INTRAMUSCULAR | Status: DC | PRN
  Administered 2021-03-30: 80 mg via INTRAVENOUS

## 2021-03-30 MED ORDER — SODIUM CHLORIDE 0.9% FLUSH
3.0000 mL | Freq: Two times a day (BID) | INTRAVENOUS | Status: DC
  Administered 2021-03-30: 3 mL via INTRAVENOUS

## 2021-03-30 MED ORDER — ROCURONIUM BROMIDE 10 MG/ML (PF) SYRINGE
PREFILLED_SYRINGE | INTRAVENOUS | Status: DC | PRN
  Administered 2021-03-30: 60 mg via INTRAVENOUS
  Administered 2021-03-30: 40 mg via INTRAVENOUS

## 2021-03-30 MED ORDER — SODIUM CHLORIDE 0.9 % IV SOLN: 250.0000 mL | INTRAVENOUS | Status: DC

## 2021-03-30 MED ORDER — MENTHOL 3 MG MT LOZG: 1.0000 | LOZENGE | OROMUCOSAL | Status: DC | PRN

## 2021-03-30 MED ORDER — MIDAZOLAM HCL 2 MG/2ML IJ SOLN
INTRAMUSCULAR | Status: DC | PRN
  Administered 2021-03-30: 2 mg via INTRAVENOUS

## 2021-03-30 MED ORDER — FENTANYL CITRATE (PF) 100 MCG/2ML IJ SOLN: 25.0000 ug | INTRAMUSCULAR | Status: DC | PRN

## 2021-03-30 MED ORDER — HYDROCHLOROTHIAZIDE 12.5 MG PO CAPS
12.5000 mg | ORAL_CAPSULE | Freq: Every day | ORAL | Status: DC
  Administered 2021-03-30 – 2021-03-31 (×2): 12.5 mg via ORAL
  Filled 2021-03-30 (×2): qty 1

## 2021-03-30 MED ORDER — METHOCARBAMOL 1000 MG/10ML IJ SOLN
500.0000 mg | Freq: Four times a day (QID) | INTRAVENOUS | Status: DC | PRN
  Filled 2021-03-30: qty 5

## 2021-03-30 MED ORDER — FENTANYL CITRATE (PF) 250 MCG/5ML IJ SOLN
INTRAMUSCULAR | Status: DC | PRN
  Administered 2021-03-30 (×2): 50 ug via INTRAVENOUS
  Administered 2021-03-30 (×2): 100 ug via INTRAVENOUS
  Administered 2021-03-30 (×3): 50 ug via INTRAVENOUS
  Administered 2021-03-30: 100 ug via INTRAVENOUS
  Administered 2021-03-30 (×2): 50 ug via INTRAVENOUS

## 2021-03-30 MED ORDER — LACTATED RINGERS IV SOLN: INTRAVENOUS | Status: DC

## 2021-03-30 MED ORDER — PROPOFOL 10 MG/ML IV BOLUS
INTRAVENOUS | Status: DC | PRN
  Administered 2021-03-30: 130 mg via INTRAVENOUS

## 2021-03-30 MED ORDER — HYDROCHLOROTHIAZIDE 12.5 MG PO TABS
12.0000 mg | ORAL_TABLET | Freq: Every day | ORAL | Status: DC
  Filled 2021-03-30: qty 1

## 2021-03-30 MED ORDER — ACETAMINOPHEN 650 MG RE SUPP: 650.0000 mg | RECTAL | Status: DC | PRN

## 2021-03-30 MED ORDER — ACETAMINOPHEN 325 MG PO TABS
650.0000 mg | ORAL_TABLET | ORAL | Status: DC | PRN
  Administered 2021-03-30 – 2021-03-31 (×2): 650 mg via ORAL
  Filled 2021-03-30 (×2): qty 2

## 2021-03-30 MED ORDER — ONDANSETRON HCL 4 MG/2ML IJ SOLN
INTRAMUSCULAR | Status: DC | PRN
  Administered 2021-03-30: 4 mg via INTRAVENOUS

## 2021-03-30 MED ORDER — OXYCODONE HCL 5 MG PO TABS: 5.0000 mg | ORAL_TABLET | ORAL | Status: DC | PRN

## 2021-03-30 MED ORDER — ALBUTEROL SULFATE HFA 108 (90 BASE) MCG/ACT IN AERS
INHALATION_SPRAY | RESPIRATORY_TRACT | Status: AC
  Filled 2021-03-30: qty 6.7

## 2021-03-30 MED ORDER — ACETAMINOPHEN 500 MG PO TABS
1000.0000 mg | ORAL_TABLET | Freq: Once | ORAL | Status: DC
  Filled 2021-03-30: qty 2

## 2021-03-30 MED ORDER — PHENOL 1.4 % MT LIQD: 1.0000 | OROMUCOSAL | Status: DC | PRN

## 2021-03-30 MED ORDER — SENNOSIDES-DOCUSATE SODIUM 8.6-50 MG PO TABS: 1.0000 | ORAL_TABLET | Freq: Every evening | ORAL | Status: DC | PRN

## 2021-03-30 MED ORDER — MOMETASONE FURO-FORMOTEROL FUM 100-5 MCG/ACT IN AERO
2.0000 | INHALATION_SPRAY | Freq: Two times a day (BID) | RESPIRATORY_TRACT | Status: DC
  Administered 2021-03-31: 2 via RESPIRATORY_TRACT
  Filled 2021-03-30: qty 8.8

## 2021-03-30 MED ORDER — HEMOSTATIC AGENTS (NO CHARGE) OPTIME
TOPICAL | Status: DC | PRN
  Administered 2021-03-30: 1 via TOPICAL

## 2021-03-30 MED ORDER — FENTANYL CITRATE (PF) 250 MCG/5ML IJ SOLN
INTRAMUSCULAR | Status: AC
  Filled 2021-03-30: qty 5

## 2021-03-30 MED ORDER — SUGAMMADEX SODIUM 200 MG/2ML IV SOLN
INTRAVENOUS | Status: DC | PRN
  Administered 2021-03-30: 200 mg via INTRAVENOUS

## 2021-03-30 MED ORDER — ONDANSETRON HCL 4 MG/2ML IJ SOLN: 4.0000 mg | Freq: Four times a day (QID) | INTRAMUSCULAR | Status: DC | PRN

## 2021-03-30 MED ORDER — DEXMEDETOMIDINE (PRECEDEX) IN NS 20 MCG/5ML (4 MCG/ML) IV SYRINGE
PREFILLED_SYRINGE | INTRAVENOUS | Status: DC | PRN
  Administered 2021-03-30: 20 ug via INTRAVENOUS

## 2021-03-30 MED ORDER — DEXAMETHASONE SODIUM PHOSPHATE 10 MG/ML IJ SOLN
INTRAMUSCULAR | Status: DC | PRN
  Administered 2021-03-30: 10 mg via INTRAVENOUS

## 2021-03-30 MED ORDER — ALBUTEROL SULFATE HFA 108 (90 BASE) MCG/ACT IN AERS
INHALATION_SPRAY | RESPIRATORY_TRACT | Status: DC | PRN
  Administered 2021-03-30 (×2): 2 via RESPIRATORY_TRACT

## 2021-03-30 MED ORDER — ONDANSETRON HCL 4 MG PO TABS: 4.0000 mg | ORAL_TABLET | Freq: Four times a day (QID) | ORAL | Status: DC | PRN

## 2021-03-30 MED ORDER — ATORVASTATIN CALCIUM 40 MG PO TABS
40.0000 mg | ORAL_TABLET | Freq: Every day | ORAL | Status: DC
  Administered 2021-03-31: 40 mg via ORAL
  Filled 2021-03-30: qty 1

## 2021-03-30 MED ORDER — THROMBIN 5000 UNITS EX SOLR
CUTANEOUS | Status: AC
  Filled 2021-03-30: qty 5000

## 2021-03-30 MED ORDER — IPRATROPIUM-ALBUTEROL 0.5-2.5 (3) MG/3ML IN SOLN: 3.0000 mL | Freq: Four times a day (QID) | RESPIRATORY_TRACT | Status: DC | PRN

## 2021-03-30 MED ORDER — CHLORHEXIDINE GLUCONATE CLOTH 2 % EX PADS
6.0000 | MEDICATED_PAD | Freq: Once | CUTANEOUS | Status: AC
  Administered 2021-03-30: 6 via TOPICAL

## 2021-03-30 MED ORDER — ALPRAZOLAM 0.5 MG PO TABS
0.5000 mg | ORAL_TABLET | Freq: Every evening | ORAL | Status: DC | PRN
Start: 2021-03-30 — End: 2021-03-31
  Administered 2021-03-30: 1 mg via ORAL
  Filled 2021-03-30: qty 2

## 2021-03-30 MED ORDER — VANCOMYCIN HCL IN DEXTROSE 1-5 GM/200ML-% IV SOLN: 1000.0000 mg | Freq: Once | INTRAVENOUS | Status: DC

## 2021-03-30 MED ORDER — ORAL CARE MOUTH RINSE: 15.0000 mL | Freq: Once | OROMUCOSAL | Status: AC

## 2021-03-30 MED ORDER — VANCOMYCIN HCL IN DEXTROSE 1-5 GM/200ML-% IV SOLN: 1000.0000 mg | INTRAVENOUS | Status: AC

## 2021-03-30 MED ORDER — SODIUM CHLORIDE 0.9% FLUSH: 3.0000 mL | INTRAVENOUS | Status: DC | PRN

## 2021-03-30 MED ORDER — CHLORHEXIDINE GLUCONATE CLOTH 2 % EX PADS: 6.0000 | MEDICATED_PAD | Freq: Once | CUTANEOUS | Status: DC

## 2021-03-30 MED ORDER — SODIUM CHLORIDE 0.9 % IV SOLN: INTRAVENOUS | Status: DC

## 2021-03-30 MED ORDER — MIDAZOLAM HCL 2 MG/2ML IJ SOLN
INTRAMUSCULAR | Status: AC
  Filled 2021-03-30: qty 2

## 2021-03-30 MED ORDER — THROMBIN 5000 UNITS EX SOLR
OROMUCOSAL | Status: DC | PRN
  Administered 2021-03-30: 5 mL via TOPICAL

## 2021-03-30 MED ORDER — METHOCARBAMOL 500 MG PO TABS
500.0000 mg | ORAL_TABLET | Freq: Four times a day (QID) | ORAL | Status: DC | PRN
  Administered 2021-03-30 – 2021-03-31 (×2): 500 mg via ORAL
  Filled 2021-03-30 (×2): qty 1

## 2021-03-30 MED ORDER — PROMETHAZINE HCL 25 MG/ML IJ SOLN: 6.2500 mg | INTRAMUSCULAR | Status: DC | PRN

## 2021-03-30 MED ORDER — PROPOFOL 10 MG/ML IV BOLUS
INTRAVENOUS | Status: AC
  Filled 2021-03-30: qty 20

## 2021-03-30 MED ORDER — OXYCODONE HCL 5 MG PO TABS
10.0000 mg | ORAL_TABLET | ORAL | Status: DC | PRN
  Administered 2021-03-30 – 2021-03-31 (×6): 10 mg via ORAL
  Filled 2021-03-30 (×6): qty 2

## 2021-03-30 MED ORDER — DEXAMETHASONE SODIUM PHOSPHATE 10 MG/ML IJ SOLN
INTRAMUSCULAR | Status: AC
  Filled 2021-03-30: qty 1

## 2021-03-30 MED ORDER — 0.9 % SODIUM CHLORIDE (POUR BTL) OPTIME
TOPICAL | Status: DC | PRN
  Administered 2021-03-30: 1000 mL

## 2021-03-30 SURGICAL SUPPLY — 63 items
BAND RUBBER #18 3X1/16 STRL (MISCELLANEOUS) ×9 IMPLANT
BENZOIN TINCTURE PRP APPL 2/3 (GAUZE/BANDAGES/DRESSINGS) ×3 IMPLANT
BIT DRILL NEURO 2X3.1 SFT TUCH (MISCELLANEOUS) ×1 IMPLANT
BLADE CLIPPER SURG (BLADE) IMPLANT
BUR CARBIDE MATCH 3.0 (BURR) ×3 IMPLANT
CANISTER SUCT 3000ML PPV (MISCELLANEOUS) ×3 IMPLANT
CARTRIDGE OIL MAESTRO DRILL (MISCELLANEOUS) ×1 IMPLANT
CLOSURE WOUND 1/2 X4 (GAUZE/BANDAGES/DRESSINGS) ×1
COVER MAYO STAND STRL (DRAPES) ×3 IMPLANT
COVER WAND RF STERILE (DRAPES) IMPLANT
DIFFUSER DRILL AIR PNEUMATIC (MISCELLANEOUS) ×3 IMPLANT
DRAPE C-ARM 42X72 X-RAY (DRAPES) ×6 IMPLANT
DRAPE HALF SHEET 40X57 (DRAPES) IMPLANT
DRAPE LAPAROTOMY 100X72X124 (DRAPES) ×3 IMPLANT
DRAPE MICROSCOPE LEICA (MISCELLANEOUS) ×3 IMPLANT
DRILL NEURO 2X3.1 SOFT TOUCH (MISCELLANEOUS) ×3
DRSG OPSITE POSTOP 4X6 (GAUZE/BANDAGES/DRESSINGS) ×3 IMPLANT
DURAPREP 6ML APPLICATOR 50/CS (WOUND CARE) ×3 IMPLANT
ELECT COATED BLADE 2.86 ST (ELECTRODE) ×3 IMPLANT
ELECT REM PT RETURN 9FT ADLT (ELECTROSURGICAL) ×3
ELECTRODE REM PT RTRN 9FT ADLT (ELECTROSURGICAL) ×1 IMPLANT
EVACUATOR 1/8 PVC DRAIN (DRAIN) IMPLANT
GAUZE 4X4 16PLY RFD (DISPOSABLE) IMPLANT
GLOVE ECLIPSE 8.0 STRL XLNG CF (GLOVE) ×6 IMPLANT
GLOVE EXAM NITRILE LRG STRL (GLOVE) IMPLANT
GLOVE EXAM NITRILE XL STR (GLOVE) IMPLANT
GLOVE EXAM NITRILE XS STR PU (GLOVE) IMPLANT
GLOVE SRG 8 PF TXTR STRL LF DI (GLOVE) ×1 IMPLANT
GLOVE SURG UNDER POLY LF SZ8 (GLOVE) ×3
GOWN STRL REUS W/ TWL LRG LVL3 (GOWN DISPOSABLE) ×1 IMPLANT
GOWN STRL REUS W/ TWL XL LVL3 (GOWN DISPOSABLE) ×1 IMPLANT
GOWN STRL REUS W/TWL 2XL LVL3 (GOWN DISPOSABLE) IMPLANT
GOWN STRL REUS W/TWL LRG LVL3 (GOWN DISPOSABLE) ×3
GOWN STRL REUS W/TWL XL LVL3 (GOWN DISPOSABLE) ×3
HEMOSTAT POWDER KIT SURGIFOAM (HEMOSTASIS) ×3 IMPLANT
KIT BASIN OR (CUSTOM PROCEDURE TRAY) ×3 IMPLANT
KIT TURNOVER KIT B (KITS) ×3 IMPLANT
NEEDLE HYPO 22GX1.5 SAFETY (NEEDLE) ×3 IMPLANT
NEEDLE SPNL 18GX3.5 QUINCKE PK (NEEDLE) ×3 IMPLANT
NS IRRIG 1000ML POUR BTL (IV SOLUTION) ×3 IMPLANT
OIL CARTRIDGE MAESTRO DRILL (MISCELLANEOUS) ×3
PACK LAMINECTOMY NEURO (CUSTOM PROCEDURE TRAY) ×3 IMPLANT
PAD ARMBOARD 7.5X6 YLW CONV (MISCELLANEOUS) ×9 IMPLANT
PIN DISTRACTION 14MM (PIN) ×6 IMPLANT
PLATE CERV OZARK 2LVL 40 (Plate) ×3 IMPLANT
PUTTY BONE 100 VESUVIUS 2.5CC (Putty) ×3 IMPLANT
SCREW VA ST OZARK 4.5X14 (Screw) ×3 IMPLANT
SCREW VA ST OZARK 4X14 (Screw) ×12 IMPLANT
SCREW VA ST OZARK 4X14 FX (Screw) ×2 IMPLANT
SCREW VA ST OZARK 4X16 (Plate) ×3 IMPLANT
SPACER LORD CASC 14X12X7 7D (Spacer) ×3 IMPLANT
SPACER LORD CASC 14X12X8 7D (Spacer) ×3 IMPLANT
SPONGE INTESTINAL PEANUT (DISPOSABLE) ×3 IMPLANT
SPONGE SURGIFOAM ABS GEL SZ50 (HEMOSTASIS) ×3 IMPLANT
STAPLER VISISTAT 35W (STAPLE) ×3 IMPLANT
STRIP CLOSURE SKIN 1/2X4 (GAUZE/BANDAGES/DRESSINGS) ×2 IMPLANT
TAPE SURG TRANSPORE 1 IN (GAUZE/BANDAGES/DRESSINGS) ×1 IMPLANT
TAPE SURGICAL TRANSPORE 1 IN (GAUZE/BANDAGES/DRESSINGS) ×3
TOWEL GREEN STERILE (TOWEL DISPOSABLE) ×3 IMPLANT
TOWEL GREEN STERILE FF (TOWEL DISPOSABLE) ×3 IMPLANT
TUBE CONNECTING 20'X1/4 (TUBING) ×1
TUBE CONNECTING 20X1/4 (TUBING) ×2 IMPLANT
WATER STERILE IRR 1000ML POUR (IV SOLUTION) ×3 IMPLANT

## 2021-03-30 NOTE — Progress Notes (Signed)
Pharmacy Antibiotic Note  Traci Cross is a 60 y.o. female admitted on 03/30/2021 for spinal decompression/discectomy/fusion.  Pharmacy has been consulted for vancomycin dosing for surgical prophylaxis.  Pre-op vancomycin dose given around 1000.  Confirm with RN that patient does not have a drain.  SCr 0.69, CrCL > 100 ml/min.  Plan: Vanc 1gm IV x 1 tonight Pharmacy will sign off  Height: 5\' 7"  (170.2 cm) Weight: 90.7 kg (200 lb) IBW/kg (Calculated) : 61.6  Temp (24hrs), Avg:98.2 F (36.8 C), Min:97.7 F (36.5 C), Max:98.6 F (37 C)  No results for input(s): WBC, CREATININE, LATICACIDVEN, VANCOTROUGH, VANCOPEAK, VANCORANDOM, GENTTROUGH, GENTPEAK, GENTRANDOM, TOBRATROUGH, TOBRAPEAK, TOBRARND, AMIKACINPEAK, AMIKACINTROU, AMIKACIN in the last 168 hours.  CrCl cannot be calculated (Patient's most recent lab result is older than the maximum 21 days allowed.).    Allergies  Allergen Reactions   Levaquin [Levofloxacin] Hives    Dr notified.    Codeine Hives   Hydrocodone Hives   Penicillins Hives    Cleva Camero D. , PharmD, BCPS, BCCCP 03/30/2021, 2:21 PM

## 2021-03-30 NOTE — Transfer of Care (Signed)
Immediate Anesthesia Transfer of Care Note  Patient: Traci Cross  Procedure(s) Performed: Cervical Four-Five, Cervical Five-Six Anterior cervical decompression/discectomy/fusion  Patient Location: PACU  Anesthesia Type:General  Level of Consciousness: drowsy and patient cooperative  Airway & Oxygen Therapy: Patient Spontanous Breathing and Patient connected to nasal cannula oxygen  Post-op Assessment: Report given to RN and Post -op Vital signs reviewed and stable  Post vital signs: Reviewed and stable  Last Vitals:  Vitals Value Taken Time  BP 130/70 03/30/21 1231  Temp    Pulse 85 03/30/21 1232  Resp 16 03/30/21 1232  SpO2 89 % 03/30/21 1232  Vitals shown include unvalidated device data.  Last Pain:  Vitals:   03/30/21 0933  TempSrc:   PainSc: 10-Worst pain ever      Patients Stated Pain Goal: 5 (03/30/21 0933)  Complications: No notable events documented.
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
## 2021-03-30 NOTE — Anesthesia Preprocedure Evaluation (Addendum)
Anesthesia Evaluation  Patient identified by MRN, date of birth, ID band Patient awake    Reviewed: Allergy & Precautions, NPO status , Patient's Chart, lab work & pertinent test results  Airway Mallampati: II  TM Distance: >3 FB Neck ROM: Limited    Dental  (+) Dental Advisory Given, Upper Dentures, Lower Dentures   Pulmonary asthma , COPD,  COPD inhaler, former smoker,    Pulmonary exam normal breath sounds clear to auscultation       Cardiovascular hypertension, Pt. on medications (-) anginaNormal cardiovascular exam Rhythm:Regular Rate:Normal     Neuro/Psych  Headaches, PSYCHIATRIC DISORDERS Depression CVA, No Residual Symptoms    GI/Hepatic negative GI ROS, Neg liver ROS,   Endo/Other  Obesity   Renal/GU negative Renal ROS     Musculoskeletal  (+) Arthritis ,   Abdominal   Peds  Hematology negative hematology ROS (+)   Anesthesia Other Findings Day of surgery medications reviewed with the patient.  Reproductive/Obstetrics                            Anesthesia Physical Anesthesia Plan  ASA: 3  Anesthesia Plan: General   Post-op Pain Management:    Induction: Intravenous  PONV Risk Score and Plan: 3 and Dexamethasone and Ondansetron  Airway Management Planned: Oral ETT and Video Laryngoscope Planned  Additional Equipment:   Intra-op Plan:   Post-operative Plan: Extubation in OR  Informed Consent: I have reviewed the patients History and Physical, chart, labs and discussed the procedure including the risks, benefits and alternatives for the proposed anesthesia with the patient or authorized representative who has indicated his/her understanding and acceptance.     Dental advisory given  Plan Discussed with: CRNA  Anesthesia Plan Comments:         Anesthesia Quick Evaluation

## 2021-03-30 NOTE — Anesthesia Postprocedure Evaluation (Signed)
Anesthesia Post Note  Patient: Traci Cross  Procedure(s) Performed: Cervical Four-Five, Cervical Five-Six Anterior cervical decompression/discectomy/fusion     Patient location during evaluation: PACU Anesthesia Type: General Level of consciousness: awake and alert Pain management: pain level controlled Vital Signs Assessment: post-procedure vital signs reviewed and stable Respiratory status: spontaneous breathing, nonlabored ventilation, respiratory function stable and patient connected to nasal cannula oxygen Cardiovascular status: blood pressure returned to baseline and stable Postop Assessment: no apparent nausea or vomiting Anesthetic complications: no   No notable events documented.  Last Vitals:  Vitals:   03/30/21 1330 03/30/21 1408  BP: 134/64 (!) 149/88  Pulse: 70 68  Resp: 16 20  Temp:    SpO2: 93% 93%    Last Pain:  Vitals:   03/30/21 1330  TempSrc:   PainSc: Asleep                 Cecile Hearing

## 2021-03-30 NOTE — Progress Notes (Signed)
   Providing Compassionate, Quality Care - Together  NEUROSURGERY PROGRESS NOTE   S: s/e in recovery, no complaints  O: EXAM:  BP 130/70 (BP Location: Left Arm)   Pulse 82   Temp 97.7 F (36.5 C)   Resp 16   Ht 5\' 7"  (1.702 m)   Wt 90.7 kg   SpO2 91%   BMI 31.32 kg/m   Awake, alert, oriented  Speech fluent, appropriate  CNs grossly intact  Trachea midline Neck soft Incision c/d/i 5/5 BUE/BLE   ASSESSMENT:  60 y.o. female with   C4-6 spondylosis with radic  S/p C4-6 ACDF   PLAN: - pt/ot -collar -pain control     Thank you for allowing me to participate in this patient's care.  Please do not hesitate to call with questions or concerns.   41, DO Neurosurgeon [facility] Cell: [phone]

## 2021-03-30 NOTE — Anesthesia Procedure Notes (Addendum)
Procedure Name: Intubation Date/Time: 03/30/2021 10:11 AM Performed by: Rosiland Oz, CRNA Pre-anesthesia Checklist: Patient identified, Emergency Drugs available, Suction available, Patient being monitored and Timeout performed Patient Re-evaluated:Patient Re-evaluated prior to induction Oxygen Delivery Method: Circle system utilized Preoxygenation: Pre-oxygenation with 100% oxygen Induction Type: IV induction Ventilation: Mask ventilation without difficulty Laryngoscope Size: Glidescope and 4 Grade View: Grade I Tube type: Oral Tube size: 7.0 mm Number of attempts: 1 Airway Equipment and Method: Stylet and Video-laryngoscopy Placement Confirmation: ETT inserted through vocal cords under direct vision, positive ETCO2 and breath sounds checked- equal and bilateral Secured at: 22 cm Tube secured with: Tape Dental Injury: Teeth and Oropharynx as per pre-operative assessment

## 2021-03-31 ENCOUNTER — Encounter (HOSPITAL_COMMUNITY): Payer: Self-pay | Admitting: Neurological Surgery

## 2021-03-31 DIAGNOSIS — M4722 Other spondylosis with radiculopathy, cervical region: Secondary | ICD-10-CM | POA: Diagnosis not present

## 2021-03-31 MED ORDER — METHOCARBAMOL 500 MG PO TABS: 500.0000 mg | ORAL_TABLET | Freq: Three times a day (TID) | ORAL | 1 refills | Status: DC | PRN

## 2021-03-31 MED ORDER — OXYCODONE HCL 10 MG PO TABS: 10.0000 mg | ORAL_TABLET | ORAL | 0 refills | Status: DC | PRN

## 2021-03-31 MED ORDER — ALPRAZOLAM 0.5 MG PO TABS: 0.5000 mg | ORAL_TABLET | Freq: Every evening | ORAL | 0 refills | Status: DC | PRN
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
## 2021-03-31 NOTE — Discharge Instructions (Signed)
Wound Care Remove dressing in 2-3 days Keep incision area dry.  You may remove outer bandage after 2 days and shower.   If you shower prior cover incision with plastic wrap.  Do not put any creams, lotions, or ointments on incision. Leave steri-strips on neck.  They will fall off by themselves. Activity Walk each and every day, increasing distance each day. No lifting greater than 5 lbs.  Avoid excessive neck motion. No driving for 2 weeks; may ride as a passenger locally. Wear neck brace at all times except when showering. Diet Resume your normal diet.   Call Your Doctor If Any of These Occur Redness, drainage, or swelling at the wound.  Temperature greater than 101 degrees. Severe pain not relieved by pain medication. Increased difficulty swallowing.  Incision starts to come apart. Follow Up Appt Call [phone] for problems.  If you have any hardware placed in your spine, you will need an x-ray before your appointment.

## 2021-03-31 NOTE — Progress Notes (Signed)
Occupational Therapy Evaluation Patient Details Name: Traci Cross MRN: [id number] DOB: 09-20-1961 Today's Date: 03/31/2021    History of Present Illness This is a 59 year old female s/p Cervical Four-Five, Cervical Five-Six Anterior cervical decompression/discectomy/fusion 6/14. Pt with a history of chronic neck pain and left greater than right C6 radiculopathy.  Her left upper extremity pain radiates down to her thumb and first finger.  PMH: arhtritis, asthma, COPD, CVA, depression, hypertension, tobacco use.   Clinical Impression   Traci Cross was evaluated s/p the above cervical sx. PTA pt lives alone in a 1 level home with 5 STE, she was indep in all ADL/IADLs and seldomly ambulated with a cane; she has limited support and 5 cats and 2 dogs indoors to tend to. Educated on all cervical precautions, brace wear/care and compensatory techniques for ADL/IADLs. Pt was receptive of information but stated "I'll do what I have to do," per pt her home is cluttered & her pets have made a mess. Educated pt on importance to adhere to precautions and maintain brace. Upon eval pt was supervision level for all funcitonal mobility and ADLs, given max vc throughout for compensatory techniques. Pt does not required further OT services acutely. Recommend d/c home with supervision/assistance for IADLs.     Follow Up Recommendations  No OT follow up;Supervision - Intermittent    Equipment Recommendations  Tub/shower bench       Precautions / Restrictions Precautions Precautions: Cervical;Fall Precaution Booklet Issued: No Precaution Comments: verbally reviewed all precautions Restrictions Weight Bearing Restrictions: No Other Position/Activity Restrictions: No lifting more than 5lbs      Mobility Bed Mobility Overal bed mobility: Needs Assistance Bed Mobility: Rolling;Sidelying to Sit Rolling: Supervision Sidelying to sit: Supervision       General bed mobility comments: requires verbal cues for log  roll technique    Transfers Overall transfer level: Needs assistance Equipment used: None Transfers: Sit to/from Stand Sit to Stand: Supervision;From elevated surface         General transfer comment: supervision for trasnfers - verbal cues for back precautions    Balance Overall balance assessment: Needs assistance Sitting-balance support: No upper extremity supported Sitting balance-Leahy Scale: Good     Standing balance support: No upper extremity supported Standing balance-Leahy Scale: Fair             ADL either performed or assessed with clinical judgement   ADL Overall ADL's : Needs assistance/impaired Eating/Feeding: Independent;Sitting   Grooming: Oral care;Cueing for compensatory techniques;Standing;Supervision/safety   Upper Body Bathing: Supervision/ safety;Cueing for compensatory techniques   Lower Body Bathing: Min guard;Sit to/from stand;Cueing for back precautions;Cueing for compensatory techniques   Upper Body Dressing : Set up;Sitting   Lower Body Dressing: Min guard;Sit to/from stand;Cueing for compensatory techniques;Cueing for back precautions   Toilet Transfer: Supervision/safety;Regular Toilet;Grab bars   Toileting- Clothing Manipulation and Hygiene: Supervision/safety;Sit to/from stand       Functional mobility during ADLs: Supervision/safety General ADL Comments: Pt required verbal cues throughout to maintin compensatory techniques for ADLs      Pertinent Vitals/Pain Pain Assessment: Faces Faces Pain Scale: Hurts a little bit Pain Location: neck Pain Descriptors / Indicators: Discomfort;Grimacing Pain Intervention(s): Limited activity within patient's tolerance;Monitored during session     Hand Dominance     Extremity/Trunk Assessment Upper Extremity Assessment Upper Extremity Assessment: Overall WFL for tasks assessed   Lower Extremity Assessment Lower Extremity Assessment: Defer to PT evaluation       Communication  Communication Communication: No difficulties   Cognition Arousal/Alertness:  Awake/alert Behavior During Therapy: WFL for tasks assessed/performed;Agitated Overall Cognitive Status: Within Functional Limits for tasks assessed             General Comments  no new concerns - educated on cervical collar wear and care, pt verbalized understanding.            Home Living Family/patient expects to be discharged to:: Private residence Living Arrangements: Alone Available Help at Discharge: Family;Available PRN/intermittently (limited family support - per report daughter has COVID right now) Type of Home: House Home Access: Stairs to enter [employer] of Steps: 5 Entrance Stairs-Rails: None Home Layout: One level     Bathroom Shower/Tub: Tub/shower unit;Curtain   Firefighter: Standard     Home Equipment: Environmental consultant - 2 wheels;Cane - single point   Additional Comments: pt has 5 cats and 2 dogs indoors      Prior Functioning/Environment Level of Independence: Independent        Comments: pt seldomly used cane for ambulation        OT Problem List: Impaired balance (sitting and/or standing);Decreased safety awareness;Decreased knowledge of use of DME or AE;Decreased knowledge of precautions;Pain      OT Treatment/Interventions:      OT Goals(Current goals can be found in the care plan section) Acute Rehab OT Goals Patient Stated Goal: home today OT Goal Formulation: With patient   AM-PAC OT "6 Clicks" Daily Activity     Outcome Measure Help from another person eating meals?: None Help from another person taking care of personal grooming?: A Little Help from another person toileting, which includes using toliet, bedpan, or urinal?: A Little Help from another person bathing (including washing, rinsing, drying)?: A Little Help from another person to put on and taking off regular upper body clothing?: A Little Help from another person to put on and taking  off regular lower body clothing?: A Little 6 Click Score: 19   End of Session Equipment Utilized During Treatment: Cervical collar;Oxygen (pt had O2 on upon arrival, doffed & VSS on RA) Nurse Communication: Mobility status  Activity Tolerance: Patient tolerated treatment well Patient left: in bed;with call bell/phone within reach  OT Visit Diagnosis: Pain Pain - part of body:  (neck)                Time: 7001-7494 OT Time Calculation (min): 20 min Charges:  OT General Charges $OT Visit: 1 Visit OT Evaluation $OT Eval Low Complexity: 1 Low    Malaisha Silliman A Excell Neyland 03/31/2021, 8:52 AM

## 2021-03-31 NOTE — Plan of Care (Signed)
Patient is discharged from room 3C04 at this time. Alert and in stable condition. IV site d/c'd and instructions read to patient with understanding verbalized and all questions answered. Left unit via wheelchair with all belongings at side. 

## 2021-03-31 NOTE — Evaluation (Signed)
Physical Therapy Evaluation and Discharge Patient Details Name: Traci Cross MRN: [id number] DOB: Aug 24, 1961 Today's Date: 03/31/2021   History of Present Illness  Pt is a 60 y/o female who presents s/p C4-C6 ACDF on 03/30/2021. PMH significant for COPD, CVA, HTN.  Clinical Impression  Patient evaluated by Physical Therapy with no further acute PT needs identified. All education attempted to be completed. Pt was able to demonstrate transfers and ambulation with gross modified independence. Pt was educated on precautions, brace application/wearing schedule, appropriate activity progression, and car transfer. Despite education, pt unwilling to maintain precautions and does not wish to discuss home environment for problem solving for some activities. Pt reports "I need to figure out how to do some things at home." When therapist offered to help problem solve and ask what things she needed to figure out, she simply said "Nope." Pt reports she has no further questions for PT. See below for any follow-up Physical Therapy or equipment needs. PT is signing off. Thank you for this referral.     Follow Up Recommendations No PT follow up;Supervision - Intermittent    Equipment Recommendations  None recommended by PT    Recommendations for Other Services       Precautions / Restrictions Precautions Precautions: Cervical;Fall Precaution Booklet Issued: Yes (comment) Precaution Comments: Reviewed handout and pt was cued for precautions during functional mobility. Restrictions Weight Bearing Restrictions: No Other Position/Activity Restrictions: No lifting more than 5lbs      Mobility  Bed Mobility Overal bed mobility: Needs Assistance Bed Mobility: Rolling;Sidelying to Sit Rolling: Supervision Sidelying to sit: Supervision       General bed mobility comments: Pt unwilling to attempt log roll. Sitting straight up in the bed to transition to EOB, relaxing either in long sitting or sitting with  legs crossed in the bed. Educated on log roll.    Transfers Overall transfer level: Modified independent Equipment used: None Transfers: Sit to/from Stand Sit to Stand: Supervision;From elevated surface         General transfer comment: Poor maintenance of precautions with excessive trunk flexion to stand, however did so without difficulty and no LOB.  Ambulation/Gait Ambulation/Gait assistance: Modified independent (Device/Increase time) Gait Distance (Feet): 400 Feet Assistive device: None Gait Pattern/deviations: Step-through pattern;Decreased stride length;Drifts right/left Gait velocity: Mildly decreased Gait velocity interpretation: 1.31 - 2.62 ft/sec, indicative of limited community ambulator General Gait Details: Mild drifting but overall ambulating well without unsteadiness or LOB.  Stairs Stairs: Yes Stairs assistance: Modified independent (Device/Increase time) Stair Management: One rail Right;Alternating pattern;Forwards Number of Stairs: 5 General stair comments: VC's for general safety. No unsteadiness with stairs utilizing an alternating pattern.  Wheelchair Mobility    Modified Rankin (Stroke Patients Only)       Balance Overall balance assessment: No apparent balance deficits (not formally assessed) Sitting-balance support: No upper extremity supported Sitting balance-Leahy Scale: Good     Standing balance support: No upper extremity supported Standing balance-Leahy Scale: Fair                               Pertinent Vitals/Pain Pain Assessment: Faces Faces Pain Scale: Hurts a little bit Pain Location: Incision site Pain Descriptors / Indicators: Discomfort;Grimacing Pain Intervention(s): Limited activity within patient's tolerance;Monitored during session;Repositioned    Home Living Family/patient expects to be discharged to:: Private residence Living Arrangements: Alone Available Help at Discharge: Other (Comment) (Pt reports no  one will be able to help  outside of taking her home from the hospital.) Type of Home: House Home Access: Stairs to enter Entrance Stairs-Rails: None Entrance Stairs-Number of Steps: 5 Home Layout: One level Home Equipment: Walker - 2 wheels;Cane - single point Additional Comments: pt has 5 cats and 2 dogs indoors    Prior Function Level of Independence: Independent         Comments: pt seldomly used cane for ambulation     Hand Dominance   Dominant Hand: Right    Extremity/Trunk Assessment   Upper Extremity Assessment Upper Extremity Assessment: Defer to OT evaluation    Lower Extremity Assessment Lower Extremity Assessment: Overall WFL for tasks assessed    Cervical / Trunk Assessment Cervical / Trunk Assessment: Other exceptions Cervical / Trunk Exceptions: s/p surgery  Communication   Communication: No difficulties  Cognition Arousal/Alertness: Awake/alert Behavior During Therapy: WFL for tasks assessed/performed;Agitated Overall Cognitive Status: Within Functional Limits for tasks assessed                                        General Comments General comments (skin integrity, edema, etc.): no new concerns - educated on cervical collar wear and care, pt verbalized understanding.    Exercises     Assessment/Plan    PT Assessment Patent does not need any further PT services  PT Problem List         PT Treatment Interventions      PT Goals (Current goals can be found in the Care Plan section)  Acute Rehab PT Goals Patient Stated Goal: Home today PT Goal Formulation: All assessment and education complete, DC therapy    Frequency     Barriers to discharge        Co-evaluation               AM-PAC PT "6 Clicks" Mobility  Outcome Measure Help needed turning from your back to your side while in a flat bed without using bedrails?: None Help needed moving from lying on your back to sitting on the side of a flat bed without  using bedrails?: None Help needed moving to and from a bed to a chair (including a wheelchair)?: None Help needed standing up from a chair using your arms (e.g., wheelchair or bedside chair)?: None Help needed to walk in hospital room?: None Help needed climbing 3-5 steps with a railing? : None 6 Click Score: 24    End of Session Equipment Utilized During Treatment: Cervical collar Activity Tolerance: Patient tolerated treatment well Patient left: in bed;with call bell/phone within reach (Sitting crossed-legged in bed.) Nurse Communication: Mobility status PT Visit Diagnosis: Pain Pain - part of body:  (neck, incision site)    Time: 2947-6546 PT Time Calculation (min) (ACUTE ONLY): 23 min   Charges:   PT Evaluation $PT Eval Low Complexity: 1 Low PT Treatments $Gait Training: 8-22 mins        Conni Slipper, PT, DPT Acute Rehabilitation Services Pager: [phone] Office: [phone]   Marylynn Pearson 03/31/2021, 8:59 AM

## 2021-04-08 ENCOUNTER — Ambulatory Visit: Payer: Medicaid Other | Admitting: Internal Medicine

## 2021-04-09 ENCOUNTER — Other Ambulatory Visit: Payer: Self-pay

## 2021-04-09 MED ORDER — ALPRAZOLAM 0.5 MG PO TABS: 0.5000 mg | ORAL_TABLET | Freq: Every evening | ORAL | 0 refills | Status: DC | PRN

## 2021-04-09 NOTE — Telephone Encounter (Signed)
Pt called that she need refills for xanax make her appt next Tuesday and called in xanax 20 tab as per [facility]

## 2021-04-12 ENCOUNTER — Telehealth: Payer: Self-pay

## 2021-04-12 NOTE — Telephone Encounter (Signed)
Patient cancel her colonoscopy due to just having neck surgery and not having a ride to her procedure tomorrow. She would like a call to reschedule the procedure for 4 weeks

## 2021-04-13 ENCOUNTER — Ambulatory Visit (INDEPENDENT_AMBULATORY_CARE_PROVIDER_SITE_OTHER): Payer: Medicaid Other | Admitting: Internal Medicine

## 2021-04-13 ENCOUNTER — Encounter: Admission: RE | Payer: Self-pay | Source: Home / Self Care

## 2021-04-13 ENCOUNTER — Encounter: Payer: Self-pay | Admitting: Internal Medicine

## 2021-04-13 ENCOUNTER — Other Ambulatory Visit: Payer: Self-pay

## 2021-04-13 ENCOUNTER — Ambulatory Visit: Admission: RE | Admit: 2021-04-13 | Payer: Medicaid Other | Source: Home / Self Care | Admitting: Gastroenterology

## 2021-04-13 DIAGNOSIS — Z1211 Encounter for screening for malignant neoplasm of colon: Secondary | ICD-10-CM

## 2021-04-13 DIAGNOSIS — J449 Chronic obstructive pulmonary disease, unspecified: Secondary | ICD-10-CM

## 2021-04-13 DIAGNOSIS — G4700 Insomnia, unspecified: Secondary | ICD-10-CM

## 2021-04-13 DIAGNOSIS — I10 Essential (primary) hypertension: Secondary | ICD-10-CM

## 2021-04-13 SURGERY — COLONOSCOPY WITH PROPOFOL
Anesthesia: General

## 2021-04-13 MED ORDER — ALPRAZOLAM 1 MG PO TABS: ORAL_TABLET | ORAL | 1 refills | Status: DC

## 2021-04-13 MED ORDER — ALBUTEROL SULFATE HFA 108 (90 BASE) MCG/ACT IN AERS: INHALATION_SPRAY | RESPIRATORY_TRACT | 1 refills | Status: DC

## 2021-04-13 MED ORDER — HYDROCHLOROTHIAZIDE 25 MG PO TABS
ORAL_TABLET | ORAL | 0 refills | Status: DC
Start: 2021-04-13 — End: 2021-06-25

## 2021-04-13 MED ORDER — BUDESONIDE-FORMOTEROL FUMARATE 80-4.5 MCG/ACT IN AERO: 2.0000 | INHALATION_SPRAY | Freq: Two times a day (BID) | RESPIRATORY_TRACT | 3 refills | Status: DC
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
## 2021-04-13 NOTE — Telephone Encounter (Signed)
Returned pt's call and rescheduled her colonoscopy to 06/01/21.

## 2021-04-23 ENCOUNTER — Telehealth: Payer: Self-pay

## 2021-04-23 NOTE — Telephone Encounter (Signed)
Completed medical records for [place] [facility] Mailed to [facility]  [address] [place], [place] [address]  

## 2021-05-04 ENCOUNTER — Ambulatory Visit: Payer: Medicaid Other | Admitting: Internal Medicine

## 2021-05-13 ENCOUNTER — Encounter: Payer: Medicaid Other | Admitting: Physician Assistant

## 2021-05-18 ENCOUNTER — Emergency Department
Admission: EM | Admit: 2021-05-18 | Discharge: 2021-05-18 | Disposition: A | Discharge status: Home / Self Care | Payer: Medicaid Other | Attending: Emergency Medicine | Admitting: Emergency Medicine

## 2021-05-18 ENCOUNTER — Emergency Department: Payer: Medicaid Other

## 2021-05-18 ENCOUNTER — Other Ambulatory Visit: Payer: Self-pay

## 2021-05-18 DIAGNOSIS — M62838 Other muscle spasm: Secondary | ICD-10-CM | POA: Insufficient documentation

## 2021-05-18 DIAGNOSIS — Z7951 Long term (current) use of inhaled steroids: Secondary | ICD-10-CM | POA: Insufficient documentation

## 2021-05-18 DIAGNOSIS — I10 Essential (primary) hypertension: Secondary | ICD-10-CM | POA: Diagnosis not present

## 2021-05-18 DIAGNOSIS — R519 Headache, unspecified: Secondary | ICD-10-CM | POA: Insufficient documentation

## 2021-05-18 DIAGNOSIS — J45909 Unspecified asthma, uncomplicated: Secondary | ICD-10-CM | POA: Insufficient documentation

## 2021-05-18 DIAGNOSIS — J441 Chronic obstructive pulmonary disease with (acute) exacerbation: Secondary | ICD-10-CM | POA: Diagnosis not present

## 2021-05-18 DIAGNOSIS — S199XXA Unspecified injury of neck, initial encounter: Secondary | ICD-10-CM | POA: Diagnosis present

## 2021-05-18 DIAGNOSIS — W108XXA Fall (on) (from) other stairs and steps, initial encounter: Secondary | ICD-10-CM | POA: Insufficient documentation

## 2021-05-18 DIAGNOSIS — W19XXXA Unspecified fall, initial encounter: Secondary | ICD-10-CM

## 2021-05-18 DIAGNOSIS — S161XXA Strain of muscle, fascia and tendon at neck level, initial encounter: Secondary | ICD-10-CM | POA: Diagnosis not present

## 2021-05-18 DIAGNOSIS — F17210 Nicotine dependence, cigarettes, uncomplicated: Secondary | ICD-10-CM | POA: Diagnosis not present

## 2021-05-18 DIAGNOSIS — Z79899 Other long term (current) drug therapy: Secondary | ICD-10-CM | POA: Insufficient documentation

## 2021-05-18 MED ORDER — PREDNISONE 20 MG PO TABS
60.0000 mg | ORAL_TABLET | Freq: Once | ORAL | Status: AC
  Administered 2021-05-18: 60 mg via ORAL
  Filled 2021-05-18: qty 3

## 2021-05-18 MED ORDER — METHOCARBAMOL 500 MG PO TABS: 500.0000 mg | ORAL_TABLET | Freq: Four times a day (QID) | ORAL | 0 refills | Status: DC

## 2021-05-18 MED ORDER — MELOXICAM 7.5 MG PO TABS
15.0000 mg | ORAL_TABLET | Freq: Once | ORAL | Status: AC
  Administered 2021-05-18: 15 mg via ORAL
  Filled 2021-05-18: qty 2

## 2021-05-18 MED ORDER — OXYCODONE-ACETAMINOPHEN 5-325 MG PO TABS: 1.0000 | ORAL_TABLET | Freq: Four times a day (QID) | ORAL | 0 refills | Status: DC | PRN

## 2021-05-18 MED ORDER — MELOXICAM 15 MG PO TABS: 15.0000 mg | ORAL_TABLET | Freq: Every day | ORAL | 0 refills | Status: DC

## 2021-05-18 NOTE — ED Triage Notes (Signed)
Pt states 2 days ago she fell down 5 stairs and now has neck pain, pt had neck surgery back in June. Pt denies taking blood thinners, or losing consciousness.
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
## 2021-05-18 NOTE — ED Notes (Signed)
Pt ambulatory to nursing desk, has removed c collar - it was in place when pt was wheeled to ED room 54. RN to pts room, advised pt that she should continue to wear c collar until CT results.

## 2021-06-01 ENCOUNTER — Encounter: Admission: RE | Payer: Self-pay | Source: Home / Self Care

## 2021-06-01 ENCOUNTER — Ambulatory Visit: Admission: RE | Admit: 2021-06-01 | Payer: Medicaid Other | Source: Home / Self Care | Admitting: Gastroenterology

## 2021-06-01 SURGERY — COLONOSCOPY WITH PROPOFOL
Anesthesia: General

## 2021-06-02 ENCOUNTER — Telehealth: Payer: Self-pay

## 2021-06-02 NOTE — Telephone Encounter (Signed)
Symbicort will not require a PA for the rest of 2022. 10.2 grams in a canister X amount of canisters ordered (3)= 30.6 grams which is a 90 day supply. Plan phone number [phone], cover my meds phone number [phone]

## 2021-06-16 ENCOUNTER — Telehealth: Payer: Self-pay | Admitting: Neurology

## 2021-06-16 NOTE — Telephone Encounter (Signed)
Pt called wanting to be scheduled I informed pt she hasn't been seen since 2020 and it looks as if we seeing her for a stroke. She states symptoms of a concussion I formed her she would need another referral. Pt requesting a call back.
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
## 2021-06-25 ENCOUNTER — Other Ambulatory Visit: Payer: Self-pay

## 2021-06-25 ENCOUNTER — Ambulatory Visit: Payer: Medicaid Other | Admitting: Physician Assistant

## 2021-06-25 ENCOUNTER — Encounter: Payer: Self-pay | Admitting: Physician Assistant

## 2021-06-25 DIAGNOSIS — J449 Chronic obstructive pulmonary disease, unspecified: Secondary | ICD-10-CM

## 2021-06-25 DIAGNOSIS — F331 Major depressive disorder, recurrent, moderate: Secondary | ICD-10-CM

## 2021-06-25 DIAGNOSIS — I10 Essential (primary) hypertension: Secondary | ICD-10-CM | POA: Diagnosis not present

## 2021-06-25 DIAGNOSIS — Z79899 Other long term (current) drug therapy: Secondary | ICD-10-CM | POA: Diagnosis not present

## 2021-06-25 DIAGNOSIS — G4700 Insomnia, unspecified: Secondary | ICD-10-CM | POA: Diagnosis not present

## 2021-06-25 DIAGNOSIS — M5412 Radiculopathy, cervical region: Secondary | ICD-10-CM | POA: Diagnosis not present

## 2021-06-25 DIAGNOSIS — E782 Mixed hyperlipidemia: Secondary | ICD-10-CM

## 2021-06-25 DIAGNOSIS — G8929 Other chronic pain: Secondary | ICD-10-CM

## 2021-06-25 LAB — POCT URINE DRUG SCREEN
POC Amphetamine UR: NOT DETECTED
POC BENZODIAZEPINES UR: POSITIVE — AB
POC Barbiturate UR: NOT DETECTED
POC Cocaine UR: NOT DETECTED
POC Ecstasy UR: NOT DETECTED
POC Marijuana UR: POSITIVE — AB
POC Methadone UR: NOT DETECTED
POC Methamphetamine UR: NOT DETECTED
POC Opiate Ur: NOT DETECTED
POC Oxycodone UR: POSITIVE — AB
POC PHENCYCLIDINE UR: NOT DETECTED
POC TRICYCLICS UR: NOT DETECTED

## 2021-06-25 MED ORDER — ATORVASTATIN CALCIUM 40 MG PO TABS: 40.0000 mg | ORAL_TABLET | Freq: Every day | ORAL | 2 refills | Status: DC

## 2021-06-25 MED ORDER — ESCITALOPRAM OXALATE 10 MG PO TABS: 10.0000 mg | ORAL_TABLET | Freq: Every day | ORAL | 2 refills | Status: DC

## 2021-06-25 MED ORDER — ALBUTEROL SULFATE HFA 108 (90 BASE) MCG/ACT IN AERS: INHALATION_SPRAY | RESPIRATORY_TRACT | 1 refills | Status: DC

## 2021-06-25 MED ORDER — HYDROCHLOROTHIAZIDE 25 MG PO TABS: ORAL_TABLET | ORAL | 0 refills | Status: DC

## 2021-06-25 MED ORDER — ALPRAZOLAM 1 MG PO TABS: ORAL_TABLET | ORAL | 1 refills | Status: DC
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
## 2021-06-29 NOTE — Telephone Encounter (Signed)
ERROR

## 2021-07-02 ENCOUNTER — Telehealth: Payer: Self-pay

## 2021-07-02 NOTE — Telephone Encounter (Signed)
Received a medication reconciliation report from [facility], I printed off a medication list from pt's chart and faxed back to [phone]

## 2021-07-12 ENCOUNTER — Encounter: Payer: Medicaid Other | Admitting: Physician Assistant

## 2021-07-19 ENCOUNTER — Telehealth: Payer: Self-pay

## 2021-07-19 NOTE — Telephone Encounter (Signed)
Verbal order for use of medical home equipment signed by provider and placed in [facility] folder.

## 2021-07-23 ENCOUNTER — Other Ambulatory Visit: Payer: Self-pay | Admitting: Physician Assistant

## 2021-07-23 DIAGNOSIS — G4700 Insomnia, unspecified: Secondary | ICD-10-CM

## 2021-07-23 NOTE — Telephone Encounter (Signed)
Last refills 9/9/222 30 with 1 refills

## 2021-08-11 ENCOUNTER — Ambulatory Visit: Payer: Medicaid Other | Admitting: Neurology

## 2021-08-11 ENCOUNTER — Encounter: Payer: Self-pay | Admitting: Neurology

## 2021-08-11 VITALS — BP 136/85 | HR 84 | Ht 67.5 in | Wt 192.0 lb

## 2021-08-11 DIAGNOSIS — G44329 Chronic post-traumatic headache, not intractable: Secondary | ICD-10-CM | POA: Diagnosis not present

## 2021-08-11 DIAGNOSIS — F0781 Postconcussional syndrome: Secondary | ICD-10-CM | POA: Diagnosis not present

## 2021-08-11 DIAGNOSIS — G43009 Migraine without aura, not intractable, without status migrainosus: Secondary | ICD-10-CM

## 2021-08-11 MED ORDER — UBRELVY 100 MG PO TABS: 100.0000 mg | ORAL_TABLET | ORAL | 11 refills | Status: DC | PRN

## 2021-08-11 MED ORDER — QULIPTA 60 MG PO TABS: 60.0000 mg | ORAL_TABLET | Freq: Every day | ORAL | 11 refills | Status: DC

## 2021-08-11 NOTE — Patient Instructions (Signed)
Post-concussive migraine  Qulipta daily for prevention Ubrelvy as needed for headache: Please take one tablet at the onset of your headache. If it does not improve the symptoms please take one additional tablet. Do not take more then 2 tablets in 24hrs. Do not take use more then 2 to 3 times in a week.  Ubrogepant tablets What is this medication? UBROGEPANT (ue BROE je pant) is used to treat migraine headaches with or without aura. An aura is a strange feeling or visual disturbance that warns you of an attack. It is not used to prevent migraines. This medicine may be used for other purposes; ask your health care provider or pharmacist if you have questions. COMMON BRAND NAME(S): Bernita Raisin What should I tell my care team before I take this medication? They need to know if you have any of these conditions: kidney disease liver disease an unusual or allergic reaction to ubrogepant, other medicines, foods, dyes, or preservatives pregnant or trying to get pregnant breast-feeding How should I use this medication? Take this medicine by mouth with a glass of water. Follow the directions on the prescription label. You can take it with or without food. If it upsets your stomach, take it with food. Take your medicine at regular intervals. Do not take it more often than directed. Do not stop taking except on your doctor's advice. Talk to your pediatrician about the use of this medicine in children. Special care may be needed. Overdosage: If you think you have taken too much of this medicine contact a poison control center or emergency room at once. NOTE: This medicine is only for you. Do not share this medicine with others. What if I miss a dose? This does not apply. This medicine is not for regular use. What may interact with this medication? Do not take this medicine with any of the following medicines: ceritinib certain antibiotics like chloramphenicol, clarithromycin, telithromycin certain  antivirals for HIV like atazanavir, cobicistat, darunavir, delavirdine, fosamprenavir, indinavir, ritonavir certain medicines for fungal infections like itraconazole, ketoconazole, posaconazole, voriconazole conivaptan grapefruit idelalisib mifepristone nefazodone ribociclib This medicine may also interact with the following medications: carvedilol certain medicines for seizures like phenobarbital, phenytoin ciprofloxacin cyclosporine eltrombopag fluconazole fluvoxamine quinidine rifampin St. John's wort verapamil This list may not describe all possible interactions. Give your health care provider a list of all the medicines, herbs, non-prescription drugs, or dietary supplements you use. Also tell them if you smoke, drink alcohol, or use illegal drugs. Some items may interact with your medicine. What should I watch for while using this medication? Visit your health care professional for regular checks on your progress. Tell your health care professional if your symptoms do not start to get better or if they get worse. Your mouth may get dry. Chewing sugarless gum or sucking hard candy and drinking plenty of water may help. Contact your health care professional if the problem does not go away or is severe. What side effects may I notice from receiving this medication? Side effects that you should report to your doctor or health care professional as soon as possible: allergic reactions like skin rash, itching or hives; swelling of the face, lips, or tongue Side effects that usually do not require medical attention (report these to your doctor or health care professional if they continue or are bothersome): drowsiness dry mouth nausea tiredness This list may not describe all possible side effects. Call your doctor for medical advice about side effects. You may report side effects to FDA at  1-800-FDA-1088. Where should I keep my medication? Keep out of the reach of children. Store at  room temperature between 15 and 30 degrees C (59 and 86 degrees F). Throw away any unused medicine after the expiration date. NOTE: This sheet is a summary. It may not cover all possible information. If you have questions about this medicine, talk to your doctor, pharmacist, or health care provider.  2022 Elsevier/Gold Standard (2018-12-20 08:50:55) Atogepant tablets What is this medication? ATOGEPANT (a TOE je pant) is used to prevent migraine headaches. This medicine may be used for other purposes; ask your health care provider or pharmacist if you have questions. COMMON BRAND NAME(S): QULIPTA What should I tell my care team before I take this medication? They need to know if you have any of these conditions: kidney disease liver disease an unusual or allergic reaction to atogepant, other medicines, foods, dyes, or preservatives pregnant or trying to get pregnant breast-feeding How should I use this medication? Take this medicine by mouth with water. Take it as directed on the prescription label at the same time every day. You can take it with or without food. If it upsets your stomach, take it with food. Keep taking it unless your health care provider tells you to stop. Talk to your health care provider about the use of this medicine in children. Special care may be needed. Overdosage: If you think you have taken too much of this medicine contact a poison control center or emergency room at once. NOTE: This medicine is only for you. Do not share this medicine with others. What if I miss a dose? If you miss a dose, take it as soon as you can. If it is almost time for your next dose, take only that dose. Do not take double or extra doses. What may interact with this medication? carbamazepine certain medicines for fungal infections like itraconazole, ketoconazole clarithromycin cyclosporine efavirenz etravirine phenytoin rifampin [facility]. John's Wort This list may not describe all  possible interactions. Give your health care provider a list of all the medicines, herbs, non-prescription drugs, or dietary supplements you use. Also tell them if you smoke, drink alcohol, or use illegal drugs. Some items may interact with your medicine. What should I watch for while using this medication? Visit your health care provider for regular checks on your progress. Tell your health care provider if your symptoms do not start to get better or if they get worse. What side effects may I notice from receiving this medication? Side effects that you should report to your doctor or health care provider as soon as possible: allergic reactions (skin rash, itching or hives; swelling of the face, lips, tongue) light-colored stool liver injury (dark yellow or brown urine; general ill feeling or flu-like symptoms; loss of appetite, right upper belly pain; unusually weak or tired, yellowing of the eyes or skin) Side effects that usually do not require medical attention (report these to your doctor or health care provider if they continue or are bothersome): constipation lack or loss of appetite nausea unusually weak or tired weight loss This list may not describe all possible side effects. Call your doctor for medical advice about side effects. You may report side effects to FDA at 1-800-FDA-1088. Where should I keep my medication? Keep out of the reach of children and pets. Store at room temperature between 20 and 25 degrees C (68 and 77 degrees F). Get rid of any unused medicine after the expiration date. To get rid of  medicines that are no longer needed or have expired: Take the medicine to a medicine take-back program. Check with your pharmacy or law enforcement to find a location. If you cannot return the medicine, check the label or package insert to see if the medicine should be thrown out in the garbage or flushed down the toilet. If you are not sure, ask your health care provider. If it is  safe to put it in the trash, take the medicine out of the container. Mix the medicine with cat litter, dirt, coffee grounds, or other unwanted substance. Seal the mixture in a bag or container. Put it in the trash. NOTE: This sheet is a summary. It may not cover all possible information. If you have questions about this medicine, talk to your doctor, pharmacist, or health care provider.  2022 Elsevier/Gold Standard (2020-07-16 11:20:03)
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
## 2021-08-12 ENCOUNTER — Telehealth: Payer: Self-pay | Admitting: *Deleted

## 2021-08-12 NOTE — Telephone Encounter (Signed)
Randel Books Key: [id number] - PA Case ID: [id number] - Rx #: [id number]  Sent PA for Ubrevly waiting on Approval

## 2021-08-18 NOTE — Telephone Encounter (Signed)
Received denial for Ubrelvy 100mg  tabs.  (Approval if headache freq oof less then 15 days headache days per month during the prior 6  months.

## 2021-08-29 ENCOUNTER — Other Ambulatory Visit: Payer: Self-pay

## 2021-08-29 MED ORDER — ALBUTEROL SULFATE HFA 108 (90 BASE) MCG/ACT IN AERS: INHALATION_SPRAY | RESPIRATORY_TRACT | 3 refills | Status: DC

## 2021-08-30 ENCOUNTER — Telehealth: Payer: Self-pay

## 2021-08-30 ENCOUNTER — Other Ambulatory Visit: Payer: Self-pay | Admitting: Physician Assistant

## 2021-08-30 DIAGNOSIS — G4700 Insomnia, unspecified: Secondary | ICD-10-CM

## 2021-08-30 MED ORDER — ALPRAZOLAM 1 MG PO TABS: ORAL_TABLET | ORAL | 0 refills | Status: DC

## 2021-08-30 NOTE — Telephone Encounter (Signed)
Pt.notified

## 2021-09-02 ENCOUNTER — Other Ambulatory Visit: Payer: Self-pay

## 2021-09-02 ENCOUNTER — Telehealth: Payer: Self-pay

## 2021-09-02 DIAGNOSIS — G4700 Insomnia, unspecified: Secondary | ICD-10-CM

## 2021-09-02 MED ORDER — ALPRAZOLAM 1 MG PO TABS
ORAL_TABLET | ORAL | 0 refills | Status: DC
Start: 2021-09-02 — End: 2023-11-10

## 2021-09-02 NOTE — Telephone Encounter (Signed)
Spoke with phar amy from [facility] and canceled  pres esend by lauren due to medicaid so called in xanax see pres

## 2021-09-08 NOTE — Telephone Encounter (Signed)
Note states pt has 6 migraine days and headaches per month (12 total). I completed another PA on Cover My Meds. [employer] - PA Case ID: [id number]. Will update when I have a determination from [facility].

## 2021-09-08 NOTE — Telephone Encounter (Signed)
Bernita Raisin approved. PA Case: [id number], Status: Approved, Coverage Starts on: 09/08/2021 12:00:00 AM, Coverage Ends on: 09/08/2022 12:00:00 AM.  I called pt's pharmacy, [facility], and notified of approval. They were able to process 16 Ubrelvy tablets for $4 and will notify the pt when its ready to be picked up.

## 2021-09-24 ENCOUNTER — Ambulatory Visit: Payer: Medicaid Other | Admitting: Physician Assistant

## 2021-10-04 ENCOUNTER — Ambulatory Visit: Payer: Medicaid Other | Admitting: Physician Assistant

## 2021-10-15 ENCOUNTER — Other Ambulatory Visit: Payer: Self-pay

## 2021-10-15 ENCOUNTER — Telehealth: Payer: Self-pay

## 2021-10-15 DIAGNOSIS — J449 Chronic obstructive pulmonary disease, unspecified: Secondary | ICD-10-CM

## 2021-10-15 DIAGNOSIS — E782 Mixed hyperlipidemia: Secondary | ICD-10-CM

## 2021-10-15 DIAGNOSIS — I10 Essential (primary) hypertension: Secondary | ICD-10-CM

## 2021-10-15 MED ORDER — ATORVASTATIN CALCIUM 40 MG PO TABS: 40.0000 mg | ORAL_TABLET | Freq: Every day | ORAL | 0 refills | Status: DC

## 2021-10-15 MED ORDER — BUDESONIDE-FORMOTEROL FUMARATE 80-4.5 MCG/ACT IN AERO: 2.0000 | INHALATION_SPRAY | Freq: Two times a day (BID) | RESPIRATORY_TRACT | 0 refills | Status: DC

## 2021-10-15 MED ORDER — HYDROCHLOROTHIAZIDE 25 MG PO TABS: ORAL_TABLET | ORAL | 0 refills | Status: DC

## 2021-10-15 NOTE — Telephone Encounter (Signed)
Send med for 90 days pt missed appt and as per courtney we send discharged letter in mailed and also verbal advised pt on last appt if she missed we not able to see her anymore

## 2021-10-15 NOTE — Telephone Encounter (Signed)
Patient called to schedule appointment. I explained to her when she made 10/04/21 appointment, she was advised to keep appointment or she would be discharged from practice. I told patient since she missed appointment, I would not be able to schedule another appointment and a discharge letter will be mailed to her. Letter mailed today-[name]

## 2022-01-24 ENCOUNTER — Ambulatory Visit: Payer: Medicaid Other | Admitting: Cardiology

## 2022-01-25 ENCOUNTER — Encounter: Payer: Self-pay | Admitting: Cardiology

## 2022-02-14 ENCOUNTER — Other Ambulatory Visit: Payer: Self-pay | Admitting: Physician Assistant

## 2022-02-14 DIAGNOSIS — I10 Essential (primary) hypertension: Secondary | ICD-10-CM

## 2022-02-25 ENCOUNTER — Ambulatory Visit: Payer: Medicaid Other | Admitting: Cardiology

## 2022-04-08 ENCOUNTER — Ambulatory Visit: Payer: Medicaid Other | Admitting: Cardiology

## 2022-04-11 ENCOUNTER — Encounter: Payer: Self-pay | Admitting: Cardiology

## 2022-05-12 ENCOUNTER — Other Ambulatory Visit: Payer: Self-pay | Admitting: Physician Assistant

## 2022-05-12 DIAGNOSIS — I10 Essential (primary) hypertension: Secondary | ICD-10-CM

## 2023-03-27 ENCOUNTER — Encounter: Payer: Self-pay | Admitting: Student in an Organized Health Care Education/Training Program

## 2023-11-10 ENCOUNTER — Encounter: Payer: Self-pay | Admitting: Cardiology

## 2023-11-10 ENCOUNTER — Ambulatory Visit (INDEPENDENT_AMBULATORY_CARE_PROVIDER_SITE_OTHER): Payer: Medicaid Other | Admitting: Cardiology

## 2023-11-10 VITALS — BP 164/90 | HR 85 | Ht 67.0 in | Wt 160.0 lb

## 2023-11-10 DIAGNOSIS — I10 Essential (primary) hypertension: Secondary | ICD-10-CM | POA: Diagnosis not present

## 2023-11-10 DIAGNOSIS — F17200 Nicotine dependence, unspecified, uncomplicated: Secondary | ICD-10-CM

## 2023-11-10 DIAGNOSIS — E782 Mixed hyperlipidemia: Secondary | ICD-10-CM | POA: Diagnosis not present

## 2023-11-10 DIAGNOSIS — J449 Chronic obstructive pulmonary disease, unspecified: Secondary | ICD-10-CM | POA: Diagnosis not present

## 2023-11-10 DIAGNOSIS — Z1211 Encounter for screening for malignant neoplasm of colon: Secondary | ICD-10-CM

## 2023-11-10 DIAGNOSIS — Z7689 Persons encountering health services in other specified circumstances: Secondary | ICD-10-CM

## 2023-11-10 DIAGNOSIS — Z131 Encounter for screening for diabetes mellitus: Secondary | ICD-10-CM

## 2023-11-10 DIAGNOSIS — Z1329 Encounter for screening for other suspected endocrine disorder: Secondary | ICD-10-CM

## 2023-11-10 DIAGNOSIS — Z1231 Encounter for screening mammogram for malignant neoplasm of breast: Secondary | ICD-10-CM

## 2023-11-10 MED ORDER — HYDROCHLOROTHIAZIDE 25 MG PO TABS: ORAL_TABLET | ORAL | 3 refills | Status: DC

## 2023-11-10 MED ORDER — VENTOLIN HFA 108 (90 BASE) MCG/ACT IN AERS: 2.0000 | INHALATION_SPRAY | Freq: Four times a day (QID) | RESPIRATORY_TRACT | 11 refills | Status: DC | PRN

## 2023-11-10 MED ORDER — ATORVASTATIN CALCIUM 40 MG PO TABS: 40.0000 mg | ORAL_TABLET | Freq: Every day | ORAL | 3 refills | Status: DC

## 2023-11-10 MED ORDER — ANORO ELLIPTA 62.5-25 MCG/ACT IN AEPB: 1.0000 | INHALATION_SPRAY | Freq: Every day | RESPIRATORY_TRACT | 11 refills | Status: DC
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
## 2023-11-24 ENCOUNTER — Encounter: Payer: Self-pay | Admitting: *Deleted

## 2024-01-09 ENCOUNTER — Ambulatory Visit: Payer: Medicaid Other | Admitting: Cardiology

## 2024-01-17 ENCOUNTER — Other Ambulatory Visit: Payer: Self-pay

## 2024-01-17 MED ORDER — ALBUTEROL SULFATE HFA 108 (90 BASE) MCG/ACT IN AERS: 2.0000 | INHALATION_SPRAY | Freq: Four times a day (QID) | RESPIRATORY_TRACT | 11 refills | Status: DC | PRN

## 2024-07-19 ENCOUNTER — Encounter: Payer: Self-pay | Admitting: Emergency Medicine

## 2024-07-19 ENCOUNTER — Emergency Department
Admission: EM | Admit: 2024-07-19 | Discharge: 2024-07-19 | Disposition: A | Discharge status: Home / Self Care | Attending: Emergency Medicine | Admitting: Emergency Medicine

## 2024-07-19 ENCOUNTER — Other Ambulatory Visit: Payer: Self-pay

## 2024-07-19 ENCOUNTER — Emergency Department

## 2024-07-19 DIAGNOSIS — I10 Essential (primary) hypertension: Secondary | ICD-10-CM

## 2024-07-19 DIAGNOSIS — E782 Mixed hyperlipidemia: Secondary | ICD-10-CM

## 2024-07-19 DIAGNOSIS — J449 Chronic obstructive pulmonary disease, unspecified: Secondary | ICD-10-CM | POA: Diagnosis not present

## 2024-07-19 DIAGNOSIS — R0602 Shortness of breath: Secondary | ICD-10-CM | POA: Diagnosis present

## 2024-07-19 LAB — CBC
HCT: 42.4 % (ref 36.0–46.0)
Hemoglobin: 14.8 g/dL (ref 12.0–15.0)
MCH: 30.8 pg (ref 26.0–34.0)
MCHC: 34.9 g/dL (ref 30.0–36.0)
MCV: 88.1 fL (ref 80.0–100.0)
Platelets: 254 K/uL (ref 150–400)
RBC: 4.81 MIL/uL (ref 3.87–5.11)
RDW: 12.4 % (ref 11.5–15.5)
WBC: 6.7 K/uL (ref 4.0–10.5)
nRBC: 0 % (ref 0.0–0.2)

## 2024-07-19 LAB — BASIC METABOLIC PANEL WITH GFR
Anion gap: 13 (ref 5–15)
BUN: 13 mg/dL (ref 8–23)
CO2: 25 mmol/L (ref 22–32)
Calcium: 8.9 mg/dL (ref 8.9–10.3)
Chloride: 106 mmol/L (ref 98–111)
Creatinine, Ser: 0.63 mg/dL (ref 0.44–1.00)
GFR, Estimated: >60 mL/min (ref >60)
Glucose, Bld: 112 mg/dL — ABNORMAL HIGH (ref 70–99)
Potassium: 3.4 mmol/L — ABNORMAL LOW (ref 3.5–5.1)
Sodium: 144 mmol/L (ref 135–145)

## 2024-07-19 MED ORDER — PREDNISONE 10 MG (21) PO TBPK: ORAL_TABLET | ORAL | 0 refills | Status: AC

## 2024-07-19 MED ORDER — ALBUTEROL SULFATE HFA 108 (90 BASE) MCG/ACT IN AERS: 2.0000 | INHALATION_SPRAY | Freq: Four times a day (QID) | RESPIRATORY_TRACT | 11 refills | Status: AC | PRN

## 2024-07-19 MED ORDER — HYDROCHLOROTHIAZIDE 25 MG PO TABS: ORAL_TABLET | ORAL | 3 refills | Status: AC

## 2024-07-19 MED ORDER — ATORVASTATIN CALCIUM 40 MG PO TABS: 40.0000 mg | ORAL_TABLET | Freq: Every day | ORAL | 3 refills | Status: AC

## 2024-07-19 MED ORDER — ALBUTEROL SULFATE HFA 108 (90 BASE) MCG/ACT IN AERS
2.0000 | INHALATION_SPRAY | RESPIRATORY_TRACT | Status: DC | PRN
  Administered 2024-07-19: 2 via RESPIRATORY_TRACT
  Filled 2024-07-19: qty 6.7

## 2024-07-19 MED ORDER — ANORO ELLIPTA 62.5-25 MCG/ACT IN AEPB: 1.0000 | INHALATION_SPRAY | Freq: Every day | RESPIRATORY_TRACT | 11 refills | Status: AC

## 2024-07-19 NOTE — ED Triage Notes (Signed)
 Pt reports SOB for a few days. Hx of COPD and out of inhaler. Pt wears O2 at night.

## 2024-07-19 NOTE — ED Provider Notes (Signed)
 [facility] Provider Note    Event Date/Time   First MD Initiated Contact with Patient 07/19/24 1613     (approximate)   History   Shortness of Breath   HPI  Traci Cross is a 63 y.o. female  who presents to the emergency department today because of concern for shortness of breath in the setting of copd and being out of her medications. The patient says she has been out of her medications for a while given difficulty with getting seen by her PCP. Over the past couple of weeks she has felt like her breathing has gotten worse. Denies any significant chest pain. No fevers. Does feel better after inhaler given in triage.      Physical Exam   Triage Vital Signs: ED Triage Vitals  Encounter Vitals Group     BP 07/19/24 1448 (!) 158/96     Girls Systolic BP Percentile --      Girls Diastolic BP Percentile --      Boys Systolic BP Percentile --      Boys Diastolic BP Percentile --      Pulse Rate 07/19/24 1448 96     Resp 07/19/24 1448 (!) 22     Temp 07/19/24 1448 98.4 F (36.9 C)     Temp Source 07/19/24 1448 Oral     SpO2 07/19/24 1448 94 %     Weight 07/19/24 1450 160 lb 15 oz (73 kg)     Height 07/19/24 1450 5' 7 (1.702 m)     Head Circumference --      Peak Flow --      Pain Score 07/19/24 1450 0     Pain Loc --      Pain Education --      Exclude from Growth Chart --     Most recent vital signs: Vitals:   07/19/24 1448  BP: (!) 158/96  Pulse: 96  Resp: (!) 22  Temp: 98.4 F (36.9 C)  SpO2: 94%   General: Awake, alert, oriented. CV:  Good peripheral perfusion. Regular rate and rhythm. Resp:  Normal effort. Lungs clear. No wheezing. Abd:  No distention.    ED Results / Procedures / Treatments   Labs (all labs ordered are listed, but only abnormal results are displayed) Labs Reviewed  BASIC METABOLIC PANEL WITH GFR - Abnormal; Notable for the following components:      Result Value   Potassium 3.4 (*)    Glucose, Bld  112 (*)    All other components within normal limits  CBC     EKG  I, Guadalupe Eagles, attending physician, personally viewed and interpreted this EKG  EKG Time: 1455 Rate: 97 Rhythm: normal sinus rhythm Axis: normal Intervals: qtc 429 QRS: narrow ST changes: no st elevation Impression: normal ekg   RADIOLOGY I independently interpreted and visualized the CXR. My interpretation: No pneumonia Radiology interpretation:  IMPRESSION:  Hyperinflation and central bronchial thickening consistent with  COPD. No focal pulmonary process.      PROCEDURES:  Critical Care performed: No    MEDICATIONS ORDERED IN ED: Medications  albuterol  (VENTOLIN  HFA) 108 (90 Base) MCG/ACT inhaler 2 puff (2 puffs Inhalation Given 07/19/24 1623)     IMPRESSION / MDM / ASSESSMENT AND PLAN / ED COURSE  I reviewed the triage vital signs and the nursing notes.  Differential diagnosis includes, but is not limited to, pneumonia, viral illness, COPD  Patient's presentation is most consistent with acute presentation with potential threat to life or bodily function.   Patient presented ot the emergency department today because of concern for shortness of breath. History of COPD and has been out of her medication for a while. At the time of my exam the patient without any wheezing. CXR without pneumonia, no significant anemia or electrolyte abnormality. Will plan on refilling patient's home medication, additionally will give patient prescription for steroid taper.      FINAL CLINICAL IMPRESSION(S) / ED DIAGNOSES   Final diagnoses:  Chronic obstructive pulmonary disease, unspecified COPD type (HCC)       Note:  This document was prepared using Dragon voice recognition software and may include unintentional dictation errors.MERLINDA Floy Roberts, MD 07/19/24 [phone]
